# Patient Record
Sex: Female | Born: 1935 | Race: White | Hispanic: No | Smoking: Never smoker
Health system: Southern US, Community
[De-identification: ages and names within clinical notes are randomized; demographics above are authoritative.]

## PROBLEM LIST (undated history)

## (undated) ENCOUNTER — Emergency Department (HOSPITAL_COMMUNITY): Admission: EM | Payer: Medicare Other | Source: Home / Self Care

## (undated) DIAGNOSIS — I82409 Acute embolism and thrombosis of unspecified deep veins of unspecified lower extremity: Secondary | ICD-10-CM

## (undated) DIAGNOSIS — R011 Cardiac murmur, unspecified: Secondary | ICD-10-CM

## (undated) DIAGNOSIS — R05 Cough: Secondary | ICD-10-CM

## (undated) DIAGNOSIS — I10 Essential (primary) hypertension: Secondary | ICD-10-CM

## (undated) DIAGNOSIS — M1990 Unspecified osteoarthritis, unspecified site: Secondary | ICD-10-CM

## (undated) DIAGNOSIS — M810 Age-related osteoporosis without current pathological fracture: Secondary | ICD-10-CM

## (undated) DIAGNOSIS — IMO0001 Reserved for inherently not codable concepts without codable children: Secondary | ICD-10-CM

## (undated) DIAGNOSIS — K219 Gastro-esophageal reflux disease without esophagitis: Secondary | ICD-10-CM

## (undated) DIAGNOSIS — F32A Depression, unspecified: Secondary | ICD-10-CM

## (undated) DIAGNOSIS — E785 Hyperlipidemia, unspecified: Secondary | ICD-10-CM

## (undated) DIAGNOSIS — C801 Malignant (primary) neoplasm, unspecified: Secondary | ICD-10-CM

## (undated) DIAGNOSIS — J45909 Unspecified asthma, uncomplicated: Secondary | ICD-10-CM

## (undated) DIAGNOSIS — R42 Dizziness and giddiness: Secondary | ICD-10-CM

## (undated) DIAGNOSIS — F329 Major depressive disorder, single episode, unspecified: Secondary | ICD-10-CM

## (undated) DIAGNOSIS — H9190 Unspecified hearing loss, unspecified ear: Secondary | ICD-10-CM

## (undated) DIAGNOSIS — R251 Tremor, unspecified: Secondary | ICD-10-CM

## (undated) DIAGNOSIS — K759 Inflammatory liver disease, unspecified: Secondary | ICD-10-CM

## (undated) DIAGNOSIS — R059 Cough, unspecified: Secondary | ICD-10-CM

## (undated) DIAGNOSIS — R002 Palpitations: Secondary | ICD-10-CM

## (undated) DIAGNOSIS — R609 Edema, unspecified: Secondary | ICD-10-CM

## (undated) HISTORY — PX: NOSE SURGERY: SHX723

## (undated) HISTORY — PX: MASTECTOMY: SHX3

## (undated) HISTORY — PX: EYE SURGERY: SHX253

## (undated) HISTORY — PX: EXTERNAL EAR SURGERY: SHX627

## (undated) HISTORY — PX: KNEE ARTHROSCOPY: SUR90

## (undated) HISTORY — PX: CHOLECYSTECTOMY: SHX55

## (undated) HISTORY — PX: BREAST SURGERY: SHX581

## (undated) HISTORY — PX: HERNIA REPAIR: SHX51

## (undated) HISTORY — PX: TONSILLECTOMY: SUR1361

## (undated) HISTORY — PX: TUBAL LIGATION: SHX77

## (undated) HISTORY — PX: IVC FILTER PLACEMENT (ARMC HX): HXRAD1551

## (undated) HISTORY — PX: APPENDECTOMY: SHX54

---

## 2002-09-05 ENCOUNTER — Inpatient Hospital Stay (HOSPITAL_COMMUNITY): Admission: AD | Admit: 2002-09-05 | Discharge: 2002-09-06 | Payer: Self-pay | Admitting: Internal Medicine

## 2002-09-05 ENCOUNTER — Encounter: Payer: Self-pay | Admitting: Internal Medicine

## 2004-09-17 ENCOUNTER — Ambulatory Visit: Payer: Self-pay | Admitting: Family Medicine

## 2005-07-19 ENCOUNTER — Inpatient Hospital Stay (HOSPITAL_COMMUNITY): Admission: RE | Admit: 2005-07-19 | Discharge: 2005-07-23 | Payer: Self-pay | Admitting: Orthopedic Surgery

## 2005-07-19 ENCOUNTER — Ambulatory Visit: Payer: Self-pay | Admitting: Physical Medicine & Rehabilitation

## 2005-07-23 ENCOUNTER — Inpatient Hospital Stay
Admission: RE | Admit: 2005-07-23 | Discharge: 2005-07-29 | Payer: Self-pay | Admitting: Physical Medicine & Rehabilitation

## 2006-02-03 ENCOUNTER — Encounter: Admission: RE | Admit: 2006-02-03 | Discharge: 2006-02-03 | Payer: Self-pay | Admitting: Family Medicine

## 2006-02-03 ENCOUNTER — Encounter (INDEPENDENT_AMBULATORY_CARE_PROVIDER_SITE_OTHER): Payer: Self-pay | Admitting: Radiology

## 2006-02-03 ENCOUNTER — Encounter (INDEPENDENT_AMBULATORY_CARE_PROVIDER_SITE_OTHER): Payer: Self-pay | Admitting: Specialist

## 2006-03-02 ENCOUNTER — Ambulatory Visit (HOSPITAL_COMMUNITY): Admission: RE | Admit: 2006-03-02 | Discharge: 2006-03-02 | Payer: Self-pay | Admitting: Surgery

## 2006-03-02 ENCOUNTER — Encounter: Admission: RE | Admit: 2006-03-02 | Discharge: 2006-03-02 | Payer: Self-pay | Admitting: Surgery

## 2006-03-02 ENCOUNTER — Encounter (INDEPENDENT_AMBULATORY_CARE_PROVIDER_SITE_OTHER): Payer: Self-pay | Admitting: Specialist

## 2006-03-10 ENCOUNTER — Ambulatory Visit: Payer: Self-pay | Admitting: Oncology

## 2006-03-21 ENCOUNTER — Ambulatory Visit: Payer: Self-pay | Admitting: Radiation Oncology

## 2006-04-14 ENCOUNTER — Ambulatory Visit: Payer: Self-pay | Admitting: Radiation Oncology

## 2006-04-26 ENCOUNTER — Ambulatory Visit: Payer: Self-pay | Admitting: Oncology

## 2006-04-28 LAB — CBC WITH DIFFERENTIAL (CANCER CENTER ONLY)
BASO#: 0 10*3/uL (ref 0.0–0.2)
BASO%: 0.6 % (ref 0.0–2.0)
EOS%: 4.6 % (ref 0.0–7.0)
HGB: 13.4 g/dL (ref 11.6–15.9)
MCH: 28.8 pg (ref 26.0–34.0)
MCHC: 33.6 g/dL (ref 32.0–36.0)
MONO%: 11 % (ref 0.0–13.0)
NEUT#: 3.5 10*3/uL (ref 1.5–6.5)
NEUT%: 58.7 % (ref 39.6–80.0)
RDW: 12.7 % (ref 10.5–14.6)

## 2006-05-14 ENCOUNTER — Ambulatory Visit: Payer: Self-pay | Admitting: Radiation Oncology

## 2006-05-30 LAB — CBC WITH DIFFERENTIAL (CANCER CENTER ONLY)
BASO#: 0 10*3/uL (ref 0.0–0.2)
BASO%: 0.5 % (ref 0.0–2.0)
EOS%: 4.6 % (ref 0.0–7.0)
HCT: 41.1 % (ref 34.8–46.6)
HGB: 13.7 g/dL (ref 11.6–15.9)
LYMPH#: 1.4 10*3/uL (ref 0.9–3.3)
LYMPH%: 25.7 % (ref 14.0–48.0)
MCH: 29.3 pg (ref 26.0–34.0)
MCHC: 33.3 g/dL (ref 32.0–36.0)
MCV: 88 fL (ref 81–101)
NEUT%: 59.3 % (ref 39.6–80.0)
RDW: 13.9 % (ref 10.5–14.6)

## 2006-05-30 LAB — COMPREHENSIVE METABOLIC PANEL
ALT: 19 U/L (ref 0–35)
AST: 25 U/L (ref 0–37)
BUN: 18 mg/dL (ref 6–23)
Creatinine, Ser: 0.63 mg/dL (ref 0.40–1.20)
Total Bilirubin: 0.5 mg/dL (ref 0.3–1.2)

## 2006-06-14 ENCOUNTER — Ambulatory Visit: Payer: Self-pay | Admitting: Radiation Oncology

## 2006-06-29 ENCOUNTER — Ambulatory Visit: Payer: Self-pay | Admitting: Oncology

## 2006-07-04 LAB — COMPREHENSIVE METABOLIC PANEL
ALT: 20 U/L (ref 0–35)
Albumin: 4 g/dL (ref 3.5–5.2)
BUN: 23 mg/dL (ref 6–23)
CO2: 26 mEq/L (ref 19–32)
Calcium: 9.5 mg/dL (ref 8.4–10.5)
Chloride: 102 mEq/L (ref 96–112)
Creatinine, Ser: 0.77 mg/dL (ref 0.40–1.20)
Potassium: 4.3 mEq/L (ref 3.5–5.3)

## 2006-07-04 LAB — CBC WITH DIFFERENTIAL (CANCER CENTER ONLY)
Eosinophils Absolute: 0.3 10*3/uL (ref 0.0–0.5)
LYMPH#: 1.4 10*3/uL (ref 0.9–3.3)
MONO#: 0.8 10*3/uL (ref 0.1–0.9)
MONO%: 10.8 % (ref 0.0–13.0)
NEUT#: 4.9 10*3/uL (ref 1.5–6.5)
Platelets: 273 10*3/uL (ref 145–400)
RBC: 4.94 10*6/uL (ref 3.70–5.32)
WBC: 7.5 10*3/uL (ref 3.9–10.0)

## 2006-07-04 LAB — CANCER ANTIGEN 27.29: CA 27.29: 24 U/mL (ref 0–39)

## 2006-07-08 ENCOUNTER — Encounter: Admission: RE | Admit: 2006-07-08 | Discharge: 2006-07-08 | Payer: Self-pay | Admitting: Oncology

## 2006-08-02 LAB — CBC WITH DIFFERENTIAL (CANCER CENTER ONLY)
BASO#: 0 10e3/uL (ref 0.0–0.2)
BASO%: 0.5 % (ref 0.0–2.0)
EOS%: 4.7 % (ref 0.0–7.0)
Eosinophils Absolute: 0.3 10e3/uL (ref 0.0–0.5)
HCT: 39 % (ref 34.8–46.6)
HGB: 13.3 g/dL (ref 11.6–15.9)
LYMPH#: 1.5 10e3/uL (ref 0.9–3.3)
LYMPH%: 24.1 % (ref 14.0–48.0)
MCH: 29.9 pg (ref 26.0–34.0)
MCHC: 34.1 g/dL (ref 32.0–36.0)
MCV: 88 fL (ref 81–101)
MONO#: 0.7 10e3/uL (ref 0.1–0.9)
MONO%: 10.4 % (ref 0.0–13.0)
NEUT#: 3.9 10e3/uL (ref 1.5–6.5)
NEUT%: 60.3 % (ref 39.6–80.0)
Platelets: 261 10e3/uL (ref 145–400)
RBC: 4.45 10e6/uL (ref 3.70–5.32)
RDW: 12 % (ref 10.5–14.6)
WBC: 6.4 10e3/uL (ref 3.9–10.0)

## 2006-08-02 LAB — COMPREHENSIVE METABOLIC PANEL
ALT: 35 U/L (ref 0–35)
Alkaline Phosphatase: 62 U/L (ref 39–117)
Glucose, Bld: 109 mg/dL — ABNORMAL HIGH (ref 70–99)
Sodium: 139 mEq/L (ref 135–145)
Total Bilirubin: 0.4 mg/dL (ref 0.3–1.2)
Total Protein: 6.2 g/dL (ref 6.0–8.3)

## 2006-08-12 ENCOUNTER — Ambulatory Visit: Payer: Self-pay | Admitting: Oncology

## 2006-08-16 LAB — PROTIME-INR (CHCC SATELLITE): INR: 1.2 — ABNORMAL LOW (ref 2.0–3.5)

## 2006-08-18 LAB — PROTIME-INR (CHCC SATELLITE): INR: 1.4 — ABNORMAL LOW (ref 2.0–3.5)

## 2006-08-19 LAB — PROTIME-INR (CHCC SATELLITE)

## 2006-08-30 LAB — PROTIME-INR (CHCC SATELLITE): Protime: 38.4 Seconds — ABNORMAL HIGH (ref 10.6–13.4)

## 2006-09-06 LAB — PROTIME-INR (CHCC SATELLITE)
INR: 2.1 (ref 2.0–3.5)
Protime: 25.2 Seconds — ABNORMAL HIGH (ref 10.6–13.4)

## 2006-09-06 LAB — CBC WITH DIFFERENTIAL (CANCER CENTER ONLY)
BASO%: 0.5 % (ref 0.0–2.0)
HCT: 39.7 % (ref 34.8–46.6)
LYMPH%: 24.5 % (ref 14.0–48.0)
MCHC: 33.3 g/dL (ref 32.0–36.0)
MCV: 88 fL (ref 81–101)
MONO#: 0.7 10*3/uL (ref 0.1–0.9)
NEUT%: 61.1 % (ref 39.6–80.0)
RDW: 12.4 % (ref 10.5–14.6)
WBC: 7.8 10*3/uL (ref 3.9–10.0)

## 2006-09-20 LAB — PROTIME-INR (CHCC SATELLITE): INR: 3.3 (ref 2.0–3.5)

## 2006-09-26 ENCOUNTER — Ambulatory Visit: Payer: Self-pay | Admitting: Oncology

## 2006-09-27 LAB — PROTIME-INR (CHCC SATELLITE)

## 2006-10-12 LAB — PROTIME-INR (CHCC SATELLITE)
INR: 2.3 (ref 2.0–3.5)
Protime: 27.6 Seconds — ABNORMAL HIGH (ref 10.6–13.4)

## 2006-10-14 ENCOUNTER — Encounter: Admission: RE | Admit: 2006-10-14 | Discharge: 2006-10-14 | Payer: Self-pay | Admitting: Oncology

## 2006-11-02 LAB — COMPREHENSIVE METABOLIC PANEL
ALT: 22 U/L (ref 0–35)
AST: 28 U/L (ref 0–37)
Alkaline Phosphatase: 69 U/L (ref 39–117)
CO2: 29 mEq/L (ref 19–32)
Sodium: 140 mEq/L (ref 135–145)
Total Bilirubin: 0.5 mg/dL (ref 0.3–1.2)
Total Protein: 6.1 g/dL (ref 6.0–8.3)

## 2006-11-02 LAB — CBC WITH DIFFERENTIAL (CANCER CENTER ONLY)
BASO%: 0.6 % (ref 0.0–2.0)
EOS%: 6 % (ref 0.0–7.0)
HCT: 37.5 % (ref 34.8–46.6)
LYMPH#: 1.9 10*3/uL (ref 0.9–3.3)
LYMPH%: 26.2 % (ref 14.0–48.0)
MCH: 29.3 pg (ref 26.0–34.0)
MCHC: 34.4 g/dL (ref 32.0–36.0)
MONO%: 8.6 % (ref 0.0–13.0)
NEUT%: 58.6 % (ref 39.6–80.0)
RDW: 12.1 % (ref 10.5–14.6)

## 2006-11-02 LAB — PROTIME-INR (CHCC SATELLITE): Protime: 24 Seconds — ABNORMAL HIGH (ref 10.6–13.4)

## 2006-11-10 ENCOUNTER — Ambulatory Visit: Payer: Self-pay | Admitting: Oncology

## 2006-11-11 LAB — PROTIME-INR (CHCC SATELLITE)
INR: 1.4 — ABNORMAL LOW (ref 2.0–3.5)
Protime: 16.8 Seconds — ABNORMAL HIGH (ref 10.6–13.4)

## 2006-11-18 LAB — PROTIME-INR (CHCC SATELLITE): INR: 1.2 — ABNORMAL LOW (ref 2.0–3.5)

## 2006-11-24 ENCOUNTER — Ambulatory Visit: Payer: Self-pay | Admitting: Radiation Oncology

## 2006-11-25 LAB — PROTIME-INR (CHCC SATELLITE)

## 2006-12-02 LAB — PROTHROMBIN TIME
INR: 2.3 — ABNORMAL HIGH (ref 0.0–1.5)
Prothrombin Time: 26.5 seconds — ABNORMAL HIGH (ref 11.6–15.2)

## 2006-12-13 ENCOUNTER — Ambulatory Visit: Payer: Self-pay | Admitting: Radiation Oncology

## 2006-12-14 LAB — CBC WITH DIFFERENTIAL (CANCER CENTER ONLY)
BASO#: 0 10*3/uL (ref 0.0–0.2)
BASO%: 0.6 % (ref 0.0–2.0)
EOS%: 8.5 % — ABNORMAL HIGH (ref 0.0–7.0)
Eosinophils Absolute: 0.6 10*3/uL — ABNORMAL HIGH (ref 0.0–0.5)
HCT: 39.6 % (ref 34.8–46.6)
HGB: 13.2 g/dL (ref 11.6–15.9)
LYMPH#: 1.6 10*3/uL (ref 0.9–3.3)
LYMPH%: 22.5 % (ref 14.0–48.0)
MCH: 28.4 pg (ref 26.0–34.0)
MCHC: 33.5 g/dL (ref 32.0–36.0)
MCV: 85 fL (ref 81–101)
MONO#: 0.7 10*3/uL (ref 0.1–0.9)
MONO%: 9.2 % (ref 0.0–13.0)
NEUT#: 4.2 10*3/uL (ref 1.5–6.5)
NEUT%: 59.2 % (ref 39.6–80.0)
Platelets: 296 10*3/uL (ref 145–400)
RBC: 4.66 10*6/uL (ref 3.70–5.32)
RDW: 12.7 % (ref 10.5–14.6)
WBC: 7 10*3/uL (ref 3.9–10.0)

## 2006-12-14 LAB — PROTIME-INR (CHCC SATELLITE)
INR: 2.4 (ref 2.0–3.5)
Protime: 28.8 Seconds — ABNORMAL HIGH (ref 10.6–13.4)

## 2006-12-14 LAB — COMPREHENSIVE METABOLIC PANEL
AST: 21 U/L (ref 0–37)
Alkaline Phosphatase: 72 U/L (ref 39–117)
BUN: 19 mg/dL (ref 6–23)
Creatinine, Ser: 0.66 mg/dL (ref 0.40–1.20)
Potassium: 4.3 mEq/L (ref 3.5–5.3)
Total Bilirubin: 0.4 mg/dL (ref 0.3–1.2)

## 2006-12-19 LAB — PROTIME-INR (CHCC SATELLITE)
INR: 2.4 (ref 2.0–3.5)
Protime: 28.8 Seconds — ABNORMAL HIGH (ref 10.6–13.4)

## 2007-01-13 ENCOUNTER — Ambulatory Visit: Payer: Self-pay | Admitting: Oncology

## 2007-01-17 LAB — PROTIME-INR (CHCC SATELLITE)
INR: 3.5 (ref 2.0–3.5)
Protime: 42 Seconds — ABNORMAL HIGH (ref 10.6–13.4)

## 2007-02-06 ENCOUNTER — Encounter: Admission: RE | Admit: 2007-02-06 | Discharge: 2007-02-06 | Payer: Self-pay | Admitting: Oncology

## 2007-02-14 LAB — PROTIME-INR (CHCC SATELLITE): INR: 1.9 — ABNORMAL LOW (ref 2.0–3.5)

## 2007-05-15 ENCOUNTER — Ambulatory Visit: Payer: Self-pay | Admitting: Radiation Oncology

## 2007-05-26 ENCOUNTER — Ambulatory Visit: Payer: Self-pay | Admitting: Radiation Oncology

## 2007-06-14 ENCOUNTER — Ambulatory Visit: Payer: Self-pay | Admitting: Oncology

## 2007-06-15 ENCOUNTER — Ambulatory Visit: Payer: Self-pay | Admitting: Radiation Oncology

## 2007-06-20 LAB — CBC WITH DIFFERENTIAL (CANCER CENTER ONLY)
BASO#: 0 10*3/uL (ref 0.0–0.2)
EOS%: 4.3 % (ref 0.0–7.0)
Eosinophils Absolute: 0.3 10*3/uL (ref 0.0–0.5)
HGB: 13.4 g/dL (ref 11.6–15.9)
LYMPH#: 1.7 10*3/uL (ref 0.9–3.3)
NEUT#: 3.5 10*3/uL (ref 1.5–6.5)
Platelets: 274 10*3/uL (ref 145–400)
RBC: 4.63 10*6/uL (ref 3.70–5.32)
WBC: 6.1 10*3/uL (ref 3.9–10.0)

## 2007-06-20 LAB — COMPREHENSIVE METABOLIC PANEL
ALT: 14 U/L (ref 0–35)
AST: 24 U/L (ref 0–37)
Albumin: 3.8 g/dL (ref 3.5–5.2)
BUN: 23 mg/dL (ref 6–23)
Calcium: 9.3 mg/dL (ref 8.4–10.5)
Chloride: 102 mEq/L (ref 96–112)
Potassium: 4.6 mEq/L (ref 3.5–5.3)
Sodium: 139 mEq/L (ref 135–145)
Total Protein: 6.8 g/dL (ref 6.0–8.3)

## 2007-11-13 ENCOUNTER — Ambulatory Visit: Payer: Self-pay | Admitting: Radiation Oncology

## 2007-11-27 ENCOUNTER — Ambulatory Visit: Payer: Self-pay | Admitting: Radiation Oncology

## 2007-11-27 ENCOUNTER — Ambulatory Visit: Payer: Self-pay | Admitting: Oncology

## 2007-11-28 ENCOUNTER — Ambulatory Visit: Payer: Self-pay | Admitting: Oncology

## 2007-11-28 LAB — CBC WITH DIFFERENTIAL (CANCER CENTER ONLY)
BASO#: 0.1 10*3/uL (ref 0.0–0.2)
BASO%: 0.7 % (ref 0.0–2.0)
Eosinophils Absolute: 0.4 10*3/uL (ref 0.0–0.5)
HCT: 39.7 % (ref 34.8–46.6)
HGB: 13.7 g/dL (ref 11.6–15.9)
LYMPH#: 2.1 10*3/uL (ref 0.9–3.3)
LYMPH%: 19.3 % (ref 14.0–48.0)
MCV: 84 fL (ref 81–101)
MONO#: 0.9 10*3/uL (ref 0.1–0.9)
NEUT%: 68.1 % (ref 39.6–80.0)
RDW: 12.5 % (ref 10.5–14.6)
WBC: 10.8 10*3/uL — ABNORMAL HIGH (ref 3.9–10.0)

## 2007-11-28 LAB — CMP (CANCER CENTER ONLY)
ALT(SGPT): 17 U/L (ref 10–47)
Albumin: 3.6 g/dL (ref 3.3–5.5)
CO2: 29 mEq/L (ref 18–33)
Calcium: 10.2 mg/dL (ref 8.0–10.3)
Chloride: 99 mEq/L (ref 98–108)
Potassium: 4.5 mEq/L (ref 3.3–4.7)
Sodium: 136 mEq/L (ref 128–145)
Total Protein: 7.9 g/dL (ref 6.4–8.1)

## 2007-11-28 LAB — CANCER ANTIGEN 27.29: CA 27.29: 27 U/mL (ref 0–39)

## 2007-12-13 ENCOUNTER — Ambulatory Visit: Payer: Self-pay | Admitting: Radiation Oncology

## 2008-02-07 ENCOUNTER — Encounter: Admission: RE | Admit: 2008-02-07 | Discharge: 2008-02-07 | Payer: Self-pay | Admitting: Oncology

## 2008-02-13 ENCOUNTER — Ambulatory Visit: Payer: Self-pay | Admitting: Radiation Oncology

## 2008-02-28 ENCOUNTER — Ambulatory Visit: Payer: Self-pay | Admitting: Oncology

## 2008-03-04 LAB — COMPREHENSIVE METABOLIC PANEL
ALT: 19 U/L (ref 0–35)
BUN: 15 mg/dL (ref 6–23)
CO2: 29 mEq/L (ref 19–32)
Creatinine, Ser: 0.81 mg/dL (ref 0.40–1.20)
Glucose, Bld: 95 mg/dL (ref 70–99)
Total Bilirubin: 0.7 mg/dL (ref 0.3–1.2)

## 2008-03-04 LAB — CBC WITH DIFFERENTIAL (CANCER CENTER ONLY)
BASO%: 0.6 % (ref 0.0–2.0)
Eosinophils Absolute: 0.3 10*3/uL (ref 0.0–0.5)
HCT: 37.2 % (ref 34.8–46.6)
LYMPH#: 2.3 10*3/uL (ref 0.9–3.3)
LYMPH%: 31.6 % (ref 14.0–48.0)
MCV: 85 fL (ref 81–101)
MONO#: 0.7 10*3/uL (ref 0.1–0.9)
Platelets: 290 10*3/uL (ref 145–400)
RBC: 4.38 10*6/uL (ref 3.70–5.32)
RDW: 12.8 % (ref 10.5–14.6)
WBC: 7.2 10*3/uL (ref 3.9–10.0)

## 2008-03-04 LAB — CANCER ANTIGEN 27.29: CA 27.29: 23 U/mL (ref 0–39)

## 2008-05-14 ENCOUNTER — Ambulatory Visit: Payer: Self-pay | Admitting: Radiation Oncology

## 2008-06-03 ENCOUNTER — Ambulatory Visit: Payer: Self-pay | Admitting: Radiation Oncology

## 2008-06-14 ENCOUNTER — Ambulatory Visit: Payer: Self-pay | Admitting: Radiation Oncology

## 2008-08-29 ENCOUNTER — Ambulatory Visit: Payer: Self-pay | Admitting: Oncology

## 2008-09-02 LAB — CMP (CANCER CENTER ONLY)
ALT(SGPT): 21 U/L (ref 10–47)
AST: 30 U/L (ref 11–38)
Calcium: 9.4 mg/dL (ref 8.0–10.3)
Chloride: 96 mEq/L — ABNORMAL LOW (ref 98–108)
Creat: 0.6 mg/dl (ref 0.6–1.2)

## 2008-09-02 LAB — CBC WITH DIFFERENTIAL (CANCER CENTER ONLY)
BASO%: 0.7 % (ref 0.0–2.0)
Eosinophils Absolute: 0.3 10*3/uL (ref 0.0–0.5)
LYMPH%: 34.1 % (ref 14.0–48.0)
MONO#: 0.5 10*3/uL (ref 0.1–0.9)
MONO%: 7.8 % (ref 0.0–13.0)
NEUT#: 3.6 10*3/uL (ref 1.5–6.5)
Platelets: 264 10*3/uL (ref 145–400)
RBC: 4.74 10*6/uL (ref 3.70–5.32)
RDW: 12.2 % (ref 10.5–14.6)
WBC: 6.8 10*3/uL (ref 3.9–10.0)

## 2008-12-03 ENCOUNTER — Ambulatory Visit: Payer: Self-pay | Admitting: Obstetrics & Gynecology

## 2009-02-12 ENCOUNTER — Encounter: Admission: RE | Admit: 2009-02-12 | Discharge: 2009-02-12 | Payer: Self-pay | Admitting: Surgery

## 2009-03-20 ENCOUNTER — Ambulatory Visit: Payer: Self-pay | Admitting: Oncology

## 2009-03-24 LAB — CBC WITH DIFFERENTIAL (CANCER CENTER ONLY)
EOS%: 4.5 % (ref 0.0–7.0)
LYMPH%: 33.4 % (ref 14.0–48.0)
MCH: 29.3 pg (ref 26.0–34.0)
MCHC: 33.2 g/dL (ref 32.0–36.0)
MCV: 88 fL (ref 81–101)
MONO%: 8.7 % (ref 0.0–13.0)
NEUT#: 3.7 10*3/uL (ref 1.5–6.5)
Platelets: 304 10*3/uL (ref 145–400)
RDW: 13.6 % (ref 10.5–14.6)

## 2009-03-24 LAB — CMP (CANCER CENTER ONLY)
ALT(SGPT): 23 U/L (ref 10–47)
BUN, Bld: 19 mg/dL (ref 7–22)
CO2: 31 mEq/L (ref 18–33)
Calcium: 9.8 mg/dL (ref 8.0–10.3)
Chloride: 98 mEq/L (ref 98–108)
Creat: 0.7 mg/dl (ref 0.6–1.2)

## 2009-03-24 LAB — CANCER ANTIGEN 27.29: CA 27.29: 20 U/mL (ref 0–39)

## 2009-05-14 ENCOUNTER — Ambulatory Visit: Payer: Self-pay | Admitting: Radiation Oncology

## 2009-05-23 ENCOUNTER — Encounter (HOSPITAL_BASED_OUTPATIENT_CLINIC_OR_DEPARTMENT_OTHER): Admission: RE | Admit: 2009-05-23 | Discharge: 2009-06-05 | Payer: Self-pay | Admitting: General Surgery

## 2009-06-02 ENCOUNTER — Ambulatory Visit: Payer: Self-pay | Admitting: Radiation Oncology

## 2009-06-10 ENCOUNTER — Encounter (HOSPITAL_BASED_OUTPATIENT_CLINIC_OR_DEPARTMENT_OTHER): Admission: RE | Admit: 2009-06-10 | Discharge: 2009-07-07 | Payer: Self-pay | Admitting: General Surgery

## 2009-06-14 ENCOUNTER — Ambulatory Visit: Payer: Self-pay | Admitting: Radiation Oncology

## 2009-07-03 ENCOUNTER — Inpatient Hospital Stay: Payer: Self-pay | Admitting: Internal Medicine

## 2009-07-06 ENCOUNTER — Emergency Department: Payer: Self-pay

## 2009-07-31 ENCOUNTER — Ambulatory Visit: Payer: Self-pay | Admitting: General Surgery

## 2009-08-04 ENCOUNTER — Ambulatory Visit: Payer: Self-pay | Admitting: General Surgery

## 2009-08-27 ENCOUNTER — Other Ambulatory Visit: Payer: Self-pay | Admitting: General Surgery

## 2009-09-19 ENCOUNTER — Ambulatory Visit: Payer: Self-pay | Admitting: Oncology

## 2009-09-23 LAB — CBC WITH DIFFERENTIAL (CANCER CENTER ONLY)
BASO#: 0 10*3/uL (ref 0.0–0.2)
Eosinophils Absolute: 0.3 10*3/uL (ref 0.0–0.5)
HGB: 12.7 g/dL (ref 11.6–15.9)
MCH: 28.5 pg (ref 26.0–34.0)
MCV: 83 fL (ref 81–101)
MONO#: 0.6 10*3/uL (ref 0.1–0.9)
NEUT#: 3.8 10*3/uL (ref 1.5–6.5)
Platelets: 319 10*3/uL (ref 145–400)
RBC: 4.47 10*6/uL (ref 3.70–5.32)
WBC: 7 10*3/uL (ref 3.9–10.0)

## 2009-09-23 LAB — CMP (CANCER CENTER ONLY)
Albumin: 3.3 g/dL (ref 3.3–5.5)
BUN, Bld: 23 mg/dL — ABNORMAL HIGH (ref 7–22)
CO2: 31 mEq/L (ref 18–33)
Calcium: 9.2 mg/dL (ref 8.0–10.3)
Glucose, Bld: 105 mg/dL (ref 73–118)
Potassium: 4.4 mEq/L (ref 3.3–4.7)
Sodium: 133 mEq/L (ref 128–145)
Total Protein: 7.1 g/dL (ref 6.4–8.1)

## 2009-09-23 LAB — PROTIME-INR (CHCC SATELLITE)
INR: 1.3 — ABNORMAL LOW (ref 2.0–3.5)
Protime: 15.6 Seconds — ABNORMAL HIGH (ref 10.6–13.4)

## 2009-10-06 LAB — PROTIME-INR (CHCC SATELLITE)

## 2009-10-13 LAB — PROTIME-INR (CHCC SATELLITE): INR: 3.1 (ref 2.0–3.5)

## 2009-10-15 ENCOUNTER — Ambulatory Visit: Payer: Self-pay | Admitting: Oncology

## 2009-10-20 LAB — PROTIME-INR (CHCC SATELLITE)
INR: 1.7 — ABNORMAL LOW (ref 2.0–3.5)
Protime: 20.4 Seconds — ABNORMAL HIGH (ref 10.6–13.4)

## 2009-10-23 LAB — PROTIME-INR (CHCC SATELLITE): Protime: 21.6 Seconds — ABNORMAL HIGH (ref 10.6–13.4)

## 2009-10-23 LAB — CBC WITH DIFFERENTIAL (CANCER CENTER ONLY)
BASO%: 0.8 % (ref 0.0–2.0)
EOS%: 3.8 % (ref 0.0–7.0)
LYMPH#: 2.3 10*3/uL (ref 0.9–3.3)
MCHC: 34.1 g/dL (ref 32.0–36.0)
MONO#: 0.5 10*3/uL (ref 0.1–0.9)
NEUT#: 3.2 10*3/uL (ref 1.5–6.5)
NEUT%: 51 % (ref 39.6–80.0)
Platelets: 310 10*3/uL (ref 145–400)
RDW: 14.2 % (ref 10.5–14.6)
WBC: 6.4 10*3/uL (ref 3.9–10.0)

## 2009-10-30 LAB — PROTIME-INR (CHCC SATELLITE): Protime: 24 Seconds — ABNORMAL HIGH (ref 10.6–13.4)

## 2009-11-06 LAB — PROTIME-INR (CHCC SATELLITE): Protime: 24 Seconds — ABNORMAL HIGH (ref 10.6–13.4)

## 2009-11-13 LAB — PROTIME-INR (CHCC SATELLITE)

## 2009-11-17 ENCOUNTER — Ambulatory Visit: Payer: Self-pay | Admitting: Oncology

## 2009-11-20 LAB — PROTIME-INR (CHCC SATELLITE)
INR: 2 (ref 2.0–3.5)
Protime: 24 Seconds — ABNORMAL HIGH (ref 10.6–13.4)

## 2009-12-10 LAB — CMP (CANCER CENTER ONLY)
ALT(SGPT): 22 U/L (ref 10–47)
AST: 27 U/L (ref 11–38)
Albumin: 3.3 g/dL (ref 3.3–5.5)
Alkaline Phosphatase: 81 U/L (ref 26–84)
Glucose, Bld: 130 mg/dL — ABNORMAL HIGH (ref 73–118)
Potassium: 4.3 mEq/L (ref 3.3–4.7)
Sodium: 138 mEq/L (ref 128–145)
Total Bilirubin: 0.7 mg/dl (ref 0.20–1.60)
Total Protein: 6.5 g/dL (ref 6.4–8.1)

## 2009-12-10 LAB — CBC WITH DIFFERENTIAL (CANCER CENTER ONLY)
BASO%: 0.7 % (ref 0.0–2.0)
Eosinophils Absolute: 0.3 10*3/uL (ref 0.0–0.5)
MCH: 28.7 pg (ref 26.0–34.0)
MONO%: 10.1 % (ref 0.0–13.0)
NEUT#: 3.6 10*3/uL (ref 1.5–6.5)
Platelets: 335 10*3/uL (ref 145–400)
RBC: 4.51 10*6/uL (ref 3.70–5.32)
RDW: 13.7 % (ref 10.5–14.6)
WBC: 6.8 10*3/uL (ref 3.9–10.0)

## 2009-12-10 LAB — PROTIME-INR (CHCC SATELLITE): INR: 2.1 (ref 2.0–3.5)

## 2010-01-02 ENCOUNTER — Ambulatory Visit: Payer: Self-pay | Admitting: Oncology

## 2010-01-30 ENCOUNTER — Ambulatory Visit: Payer: Self-pay | Admitting: Oncology

## 2010-02-04 LAB — PROTIME-INR (CHCC SATELLITE): INR: 2.3 (ref 2.0–3.5)

## 2010-02-19 LAB — CBC WITH DIFFERENTIAL (CANCER CENTER ONLY)
BASO#: 0 10*3/uL (ref 0.0–0.2)
Eosinophils Absolute: 0.3 10*3/uL (ref 0.0–0.5)
HCT: 37.6 % (ref 34.8–46.6)
HGB: 12.9 g/dL (ref 11.6–15.9)
LYMPH%: 33 % (ref 14.0–48.0)
MCH: 29 pg (ref 26.0–34.0)
MCV: 85 fL (ref 81–101)
MONO%: 9.5 % (ref 0.0–13.0)
NEUT%: 52.8 % (ref 39.6–80.0)
RBC: 4.44 10*6/uL (ref 3.70–5.32)

## 2010-02-26 ENCOUNTER — Ambulatory Visit: Payer: Self-pay | Admitting: Oncology

## 2010-03-04 LAB — PROTIME-INR (CHCC SATELLITE)
INR: 2.1 (ref 2.0–3.5)
Protime: 25.2 Seconds — ABNORMAL HIGH (ref 10.6–13.4)

## 2010-03-25 ENCOUNTER — Ambulatory Visit: Payer: Self-pay | Admitting: Oncology

## 2010-04-16 ENCOUNTER — Ambulatory Visit: Payer: Self-pay | Admitting: Oncology

## 2010-04-22 ENCOUNTER — Ambulatory Visit: Payer: Self-pay | Admitting: Oncology

## 2010-04-27 ENCOUNTER — Ambulatory Visit: Payer: Self-pay | Admitting: Oncology

## 2010-04-29 LAB — PROTIME-INR (CHCC SATELLITE)
INR: 2.5 (ref 2.0–3.5)
Protime: 30 Seconds — ABNORMAL HIGH (ref 10.6–13.4)

## 2010-05-14 ENCOUNTER — Ambulatory Visit: Payer: Self-pay | Admitting: Oncology

## 2010-05-21 ENCOUNTER — Ambulatory Visit: Payer: Self-pay | Admitting: Oncology

## 2010-07-06 ENCOUNTER — Encounter: Payer: Self-pay | Admitting: Oncology

## 2010-10-26 ENCOUNTER — Ambulatory Visit: Payer: Self-pay | Admitting: Oncology

## 2010-10-27 LAB — CANCER ANTIGEN 27.29: CA 27.29: 25 U/mL (ref 0.0–38.6)

## 2010-10-27 NOTE — Assessment & Plan Note (Signed)
[name], [name] NO.:  [id number]   MEDICAL RECORD NO.:  [id number]          PATIENT TYPE:  POB   LOCATION:  [facility] at [facility]         FACILITY:  [facility]   PHYSICIAN:  Elsie Lincoln, MD      DATE OF BIRTH:  1924-07-01   DATE OF SERVICE:  12/03/2008                                  CLINIC NOTE   The patient is an 77 year old G5, para 4-0-1-3, who presents with  postmenopausal bleeding.  The patient was diagnosed with breast cancer  in 2007.  She had a lumpectomy and sentinel node biopsy, refused full  axillary node dissection.  She was diagnosed with stage II left breast  carcinoma.  She underwent radiation therapy, but did not do  chemotherapy.  She was started on tamoxifen, but developed a greater  saphenous vein thrombosis in the left leg and tamoxifen was  discontinued.  She has been on Arimidex and is doing fine.  She has had  3 episodes of postmenopausal bleeding this month and she was sent to be  for endometrial biopsy.   PAST MEDICAL HISTORY:  Arthritis, asthma, pneumonia, high cholesterol,  infectious hepatitis, breast cancer, based cell carcinoma, and blood  clot in the greater saphenous vein.   SURGERY:  Tonsillectomy, ruptured appendix, cyst on the right ovary,  bilateral tubal ligation, questionable right cystectomy, questionable  right oophorectomy, questionable right salpingectomy, tumor on the back  removed, umbilical hernia repair, cholecystectomy, basal cell carcinoma  removed on the nose, the knee placement, and the cancer surgery on her  breast.   GYNECOLOGIC HISTORY:  Menarche at age 34, menopause at age 75.  No  postmenopausal bleeding other than this month.  She has never had any  fibroid tumors of her uterus.  She had an ovarian cyst diagnosed at time  of the appendix rupture when she was 20.  She thinks they took out her  right tube, but she is unsure, they may have also taken out a cyst or  her right ovary, but she is unsure.   She is not sexually active.  Her  husband died about 10 years ago.  She has never had abnormal Pap smear  or sexually transmitted diseases.   MEDICATIONS:  Boniva, calcium, multivitamin, aspirin, propranolol, and  Arimidex   ALLERGIES:  PENICILLIN, IBANDRONATE SODIUM, SULFA allergy.  No latex  allergy.   REVIEW OF SYSTEMS:  Positive for breathing and shortness of breath.   SOCIAL HISTORY:  Drinks occasional alcoholic drink.  Denies drugs abuse,  tobacco.   PHYSICAL EXAMINATION:  VITAL SIGNS:  Pulse 64, blood pressure 120/69,  weight 175, height 5 feet 2 inches.  GENERAL:  A well nourished, well developed, no apparent stress.  HEENT:  Normocephalic, atraumatic.  CHEST:  Normal movement.  ABDOMEN:  Soft, nontender.  No organomegaly.  No hernia.  PELVIC:  Genitalia, Tanner V.  Vagina atrophic.  Varicosities of the  labia majora, mild cystocele, nontender.  Bladder and urethra, moderate  uterine prolapse, small anteverted uterus.  No adnexal masses felt.  No  major rectocele.  EXTREMITIES:  Nontender.   ASSESSMENT AND PLAN:  An 77 year old female with postmenopausal  bleeding, not on tamoxifen.  1. Endometrial biopsies.  Please see separated procedure note.  2. Transvaginal ultrasound.  3. Return to clinic in 2 weeks for results.   PROCEDURE NOTE:  After informed consent was obtained, the patient was  placed in dorsal lithotomy position.  The speculum was placed into the  vagina and the cervix was brought into view.  The cervix was cleaned  with Betadine and the anterior lip of the cervix was grasped with a  single-tooth tenaculum.  Uterus sounded to 7 cm, a pass with endometrial  curette performed and some dark blood was obtained and this was sent to  Pathology.  There was good hemostasis at the end of the procedure.  The  patient tolerated the procedure well and all counts were correct.           ______________________________  Elsie Lincoln, MD     KL/MEDQ  D:   12/03/2008  T:  12/04/2008  Job:  [id number]

## 2010-10-30 NOTE — H&P (Signed)
[name], LOPATA NO.:  [id number]   MEDICAL RECORD NO.:  [id number]          PATIENT TYPE:  ORB   LOCATION:  4526                         FACILITY:  [facility]   PHYSICIAN:  Erick Colace, M.D.DATE OF BIRTH:  06/02/1925   DATE OF ADMISSION:  07/23/2005  DATE OF DISCHARGE:                                HISTORY & PHYSICAL   ATTENDING PHYSICIAN:  Ellwood Dense, M.D.   REASON FOR ADMISSION:  Rehabilitation following a right total knee  arthroplasty.   HISTORY:  this 77 year old female was admitted to [facility] on  July 19, 2005, with advanced right knee pain and x-ray findings  consistent with severe osteoarthritis.  She had no relief with conservative  care and underwent a right total knee arthroplasty on July 19, 2005.  She  was placed on Coumadin for deep venous thrombosis prophylaxis and made  weightbearing as tolerated.  Postoperatively her hemoglobin dropped to 9.2.  She had mild confusion and narcotics were decreased.  Her mental status  improved.   REVIEW OF SYSTEMS:  Positive for joint swelling, positive nausea, negative  chest pain, negative vomiting.   PAST MEDICAL HISTORY:  1.  Significant for asthma.  Does not take any medications for this.  2.  Skin cancer.  3.  Osteoporosis.  4.  Pneumonia.  5.  Viral gastroenteritis in the past.   PAST SURGICAL HISTORY:  1.  Hernia repair in 1998.  2.  Pelvic tumor excision in 1990'2.  3.  Tubal ligation in 1961.  4.  Cholecystectomy in 1996.  5.  T&A.   FAMILY HISTORY:  Positive for coronary artery disease, CVA.   SOCIAL HISTORY:  Widowed.  Retired.  Lives alone in [place] in a one-level home  with zero steps to enter.  Prior functional history independent.   FUNCTIONAL HISTORY:  Needs assist with ADL's and mobility.   MEDICATIONS:  1.  Aspirin 81 mg p.o. daily.  2.  Multivitamins, iron one p.o. daily.   ALLERGIES:  PENICILLIN, MORPHINE SULFATE AND COSMETICS.   PHYSICAL  EXAMINATION:  VITAL SIGNS:  Blood pressure 125/59, pulse 71,  respirations 20, temperature 97.5 degrees.  GENERAL:  An elderly female, in no acute distress.  Mood and affect  appropriate.  HEENT:  Eyes anicteric, not injected.  External ENT normal.  NECK:  Supple without adenopathy.  LUNGS:  Respiratory effort is good.  Lungs clear.  HEART:  A regular rate and rhythm.  No murmurs or extra sounds.  ABDOMEN:  Positive bowel sounds, soft, nontender to palpation.  EXTREMITIES:  No clubbing, cyanosis or edema.  Right knee has staples  intact.  No erythema or effusion noted.  He was able to do a straight leg  raise on  the right side as well as the left lower extremity.  NEUROLOGIC:  Sensation normal.  Mood and memory normal, as well as affect.  Motor strength 5/5 in the bilateral deltoid, biceps and triceps, finger  flexors, left hip flexor, quadriceps, TA and gastroc.  On the right side she  has 4/5 hip flexor, quadriceps, TA and gastroc.   IMPRESSION:  1.  Right total knee arthroplasty, secondary to degenerative joint disease,      postoperative day number four, status post total knee replacement.  2.  Postoperative anemia:  Will follow the CBC.  3.  Postoperative confusion, improved with the reduction of narcotics:  Will      watch her on Vicodin.  4.  Osteoporosis:  Continue calcium, but restart Fosamax as an outpatient.  5.  Deep venous thrombosis prophylaxis:  Coumadin per the pharmacy protocol.   Estimated length of stay is five to seven days.  The patient is a good rehab  candidate for the [facility] level.      Erick Colace, M.D.  Electronically Signed     AEK/MEDQ  D:  07/23/2005  T:  07/23/2005  Job:  [id number]   cc:   Talmadge Coventry, M.D.  Fax: [phone]   Ollen Gross, M.D.  Fax: [phone]

## 2010-10-30 NOTE — Discharge Summary (Signed)
NAMERAKHI, ROMAGNOLI NO.:  [id number]   MEDICAL RECORD NO.:  [id number]          PATIENT TYPE:  INP   LOCATION:  1512                         FACILITY:  [facility]   PHYSICIAN:  Ollen Gross, M.D.    DATE OF BIRTH:  07-Jan-1925   DATE OF ADMISSION:  07/19/2005  DATE OF DISCHARGE:  07/23/2005                                 DISCHARGE SUMMARY   ADMITTING DIAGNOSES:  1.  Osteoarthritis, right knee.  2.  Asthma.  3.  History of skin cancer.  4.  Osteoporosis.   DISCHARGE DIAGNOSES:  1.  Osteoarthritis, right knee, status post right total knee arthroplasty.  2.  Postoperative blood loss anemia, did not require transfusion.  3.  Postoperative hyponatremia, improved.  4.  Postoperative confusion, improved.  5.  Postoperative dizziness, improved.  6.  Asthma.  7.  History of skin cancer.  8.  Osteoporosis.   PROCEDURE:  July 19, 2005, right total knee surgery, surgeon Dr. Lequita Halt,  assistant Avel Peace, PA-C.  __________   CONSULTS:  1.  [facility], Dr. Trula Ore Rama.  2.  Rehab services.   BRIEF HISTORY:  Ms. Wee is a 77 year old female with end-stage  osteoarthritis of the right knee.  She has failed nonoperative management,  including injections and arthroscopic debridement, now presents now for  total knee arthroplasty.   LABORATORY DATA:  Admission hemoglobin 13.5, hematocrit 40.2, white cell  count normal 8.4.  Postop hemoglobin 10.2 drifted down to 9.5 last noted 9.2  and 26.3.  Preop PT/PTT 13.3 and 29, respectively.  INR 1.0.  Serial pro  times followed.  Last noted PT/INR 21.6 and 1.9.  Chem panel on admission  all within normal limits.  Serial BMETs were followed.  Sodium dropped from  140 to 132 back up to 136.  Remaining electrolytes remained within normal  limits.  Glucose went up from 120 to 158 back down to 126.  TSH level taken  1 time and normal at 1.075.  Anemia study taken, iron low at 10, TIBC low at  234, percent  saturation 4, ferritin high at 341, UIBC at 224.  Preop UA  trace hemoglobin, trace leukocyte esterase, 0-2 white cells, 0-2 red cells,  and rare epithelium.  Blood group type A positive.   HOSPITAL COURSE:  The patient admitted to [facility], tolerated  procedure well, later transferred to the recovery room and the orthopedic  floor.  Actually was doing pretty well on day 1, got some sleep but did have  some nausea, given antiemetics.  Hemovac drain was pulled on day 1.  The  patient wanted to look into inpatient rehab.  Therefore, rehab consult was  called.  The patient was seen by rehab services and felt that patient would  be able to participate and proceed with a rehab stay.  By day 2, the patient  was a little bit sedated, probably from the PCA and had some dizziness.  Hemoglobin was at 9.5.  The pain pump was discontinued.  I felt it could be  narcotics which were discontinued, gave fluid bolus, had decent urinary  output.  By day 3, the patient continued to have some lightheadedness with  transfers and was found to be orthostatic, given fluid challenge.  Orthostatics pressures did improve on the evening of day 2; however, this  continued into day 3.  A medical consult was called.  The patient was seen  by Dr. Darnelle Catalan and felt likely with still some residual narcotics and  anticholinergics, continued to increase p.o. intake and monitor the  hemodynamic status.  TSH level was checked which was normal, responded well  to p.o. intake and by the following day, day 4, was doing much better.  Still felt a little bit weak; Xanax helped but was doing much better with  the pressure stable, and the dizziness had improved, slowly progressed on  physical therapy by this time.  By day 3 and day 4, the patient was  ambulating approximately 40 feet, felt to be an excellent candidate.  It was  noted that a bed did open up on SACU later that day.  The patient was stable  from medical  standpoint.  The intermittent confusion and the dizziness,  which was felt to be due to narcotic, had resolved, and she was transferred  at that time.   DISCHARGE PLAN:  1.  The patient transferred over to [facility].  2.  Discharge diagnoses, please see above.  3.  Discharge medications.  Current medications as per the [facility] will be sent      over with the patient.   DIET:  Continue current diet.   ACTIVITY:  Weightbearing as tolerated, PT/OT for gait training ambulation,  ADLs for total knee protocol, daily dressing changes, may start showering.   FOLLOW UP:  Two weeks from discharge from the rehab unit.   CONDITION ON DISCHARGE:  Improving.   DISPOSITION:  [facility].      Alexzandrew L. Julien Girt, P.A.      Ollen Gross, M.D.  Electronically Signed    ALP/MEDQ  D:  08/30/2005  T:  08/31/2005  Job:  [id number]   cc:   Rehab Services   Hillery Aldo, M.D.   Talmadge Coventry, M.D.  Fax: [phone]

## 2010-10-30 NOTE — Op Note (Signed)
NAMEDEL, WISEMAN             ACCOUNT NO.:  [id number]   MEDICAL RECORD NO.:  [id number]          PATIENT TYPE:  AMB   LOCATION:  SDS                          FACILITY:  [facility]   PHYSICIAN:  Thornton Park. Daphine Deutscher, MD  DATE OF BIRTH:  20-Mar-1925   DATE OF PROCEDURE:  03/02/2006  DATE OF DISCHARGE:                                 OPERATIVE REPORT   CCS number is [id number].   PREOPERATIVE DIAGNOSIS:  [name] is a 77 year old white female from  the  [facility] on whom I had previous operated who saw me on February 18, 2006 after having a core needle biopsy of a suspicious area in the left  upper outer quadrant of her breast.  This was invasive adenocarcinoma.  Ultrasound showed negative nodes.  She was scheduled for a left breast  partial mastectomy and sentinel lymph node biopsy.  She really declined  having a completion axillary dissection in the event that she had positive  nodes.  Her immunohistochemical study showed that her estrogen receptor was  98% positive, progesterone receptors 45% positive,  proliferation marker Ki-  67 was 9%.  HER-2/neu was negative.   The patient was taken to OR #17 on March 02, 2006 and given general by  LMA.  I did sentinel lymph node mapping first and found very few counts in  the axilla.  I injected the area with methylene blue, and then I made a  small incision and went in and found a lymphatics with dye in them and  traced that in and began getting counts.  I isolated a lymph node cluster  with the only significant counts with a background of 0 and excised this  group in toto.  I put clips around this as I excised the lymphatics and the  vascular structures entering it.  This was then placed on the probe and had  a count of approximately 50.  Again, there was a background count now in the  axillae of 0.  I then irrigated with saline and saw no further bleeding and  then sent the specimen for touch preps which may have some atypical cells  but no definitive cancer.  Again, completion axillary dissection was not  desired by the patient.   In the meantime, I went and excised the previous core biopsy site.  I went  through the upper outer quadrant elliptical incision and then created  superior and medial flaps and went out around what I could feel was the  lesion and went down to the chest wall.  I then excised this in the form of  a quadrantectomy or actual more partial mastectomy to get wide margins and  get the entire radially the upper outer quadrant back almost to the areolar  margin.  This was oriented with sutures in the axilla and medially, and also  a sponge was sewn to the posterior wall and the skin marked the anterior  surface.  This was sent for specimen mammography which showed that the clip  that had been left there was in place.   I then irrigated with saline  and went back and inspected both wounds and  used the electrocautery to control any bleeding that was seen, and really  not much was seen.  I infiltrated the areas with 1% lidocaine and then  closed with 4-0 Vicryl subcutaneously and subcuticularly with Benzoin Steri-  Strips.  A bulky dressing was applied.   The patient will be given Tylox to take for pain.  Instructions were given  to her family, and she will be able to go home today.  For  radiation  therapy, I am going to refer her to [facility] in [place] since she  lives in [place].   FINAL DIAGNOSIS:  Status post left axillary lymph node mapping, left  sentinel lymph node biopsy, left breast partial mastectomy of the upper  outer quadrant.      Thornton Park Daphine Deutscher, MD  Electronically Signed     MBM/MEDQ  D:  03/02/2006  T:  03/03/2006  Job:  [id number]   cc:   Talmadge Coventry, M.D.  [facility]

## 2010-10-30 NOTE — Consult Note (Signed)
[name], [name] NO.:  [id number]   MEDICAL RECORD NO.:  [id number]          PATIENT TYPE:  INP   LOCATION:  1512                         FACILITY:  [facility]   PHYSICIAN:  Hillery Aldo, M.D.   DATE OF BIRTH:  [birth year]   DATE OF CONSULTATION:  07/22/2005  DATE OF DISCHARGE:                                   CONSULTATION   PRIMARY CARE PHYSICIAN:  Talmadge Coventry, M.D.   REASON FOR CONSULTATION:  Dizziness and postoperative confusion.   HISTORY OF PRESENT ILLNESS:  Patient is a 77 year old female who was  admitted by Dr. Despina Hick for right total knee replacement.  The patient felt  some symptomatic orthostatic hypotension on postoperative day #2, which  responded well to fluid bolus.  We are consulted secondary to ongoing  complaints of lightheadedness with position changes.  Notably, the patient  also reports that she had a lot of anxiety prior to her operation and that  normally she does not have this kind of anxiety.  She denies any chest pain.  She has had some mild shortness of breath with exertion after her operation.  She denies any cough.  No subjective arrhythmias.  No focal neurological  deficits.   PAST MEDICAL HISTORY:  1.  Asthma.  2.  Osteoporosis.  3.  Skin cancer.  4.  History of scarlet fever.  5.  History of pneumonia.  6.  History of hospitalization in 2004 for dizziness/orthostatic      hypotension, thought to be secondary to past viral gastroenteritis.   ALLERGIES:  PENICILLIN, MORPHINE, FOSAMAX.   CURRENT MEDICATIONS:  1.  Coumadin per pharmacy.  2.  Docusate sodium 100 mg b.i.d.  3.  Tylenol 650 mg q.4h. p.r.n.  4.  Reglan 10 mg q.8h. p.r.n.  5.  Robaxin 500 mg q.6h. p.r.n.  6.  Phenergan 12.5 mg q.4h. p.r.n.  7.  Vicodin 1-2 tabs p.o. q.4h. p.r.n.   PAST SURGICAL HISTORY:  1.  Tonsillectomy in 1946.  2.  Appendectomy in 1946.  3.  Tubal ligation in 1961.  4.  Fibroid tumor excision in 1990s.  5.  Cholecystectomy in  1996.  6.  Umbilical hernia repair in 1998.  7.  Knee arthroscopy in May, 2006.  8.  Right total knee replacement in February, 2007.   SOCIAL HISTORY:  Patient is widowed and lives independently in [place].  Denies  any tobacco, alcohol or drug use.  She has four offspring, three who are  living.   FAMILY HISTORY:  Patient's mother is deceased at age 13 from heart disease.  The patient's father died at age 9 from complications of stroke.  He also  suffered with asthma.  She has one half brother who has asthma.  She has a  daughter who died from a stroke at age 84.   REVIEW OF SYSTEMS:  No fevers or chills.  Decreased appetite and po fluid  intake.  No chest pain.  Occasional shortness of breath with exertion.  No  cough.  No subjective arrhythmia.  Bowels have not moved since admission.  No blood in the stool.  Occasional nausea without vomiting.  Anxiety as  noted as per HPI.   PHYSICAL EXAMINATION:  VITAL SIGNS:  Temperature 97.9, blood pressure  127/57, pulse 72, respirations 20, O2 saturation 95% on 2 liters of oxygen.  GENERAL:  An anxious female in no acute distress.  HEENT:  Normocephalic and atraumatic.  PERRL.  EOMI.  Visual fields are  full.  Tongue is midline.  Palate rises symmetrically.  NECK:  Supple.  No thyromegaly.  No lymphadenopathy.  No jugular venous  distention.  CHEST:  Lungs are clear to auscultation bilaterally with good air movement.  No wheezes.  HEART:  Regular rate and rhythm.  No murmurs, rubs or gallops.  ABDOMEN:  Soft, nontender, nondistended with normoactive bowel sounds.  EXTREMITIES:  Patient has a right knee incision, which is without signs of  infection.  NEUROLOGIC:  The patient is alert and oriented x3.  Neuro exam is nonfocal.   DATA REVIEW:  Laboratory data reveals a sodium of 136, potassium 4.1,  chloride 105, bicarb 29, BUN 10, creatinine 0.7, glucose 126.  White blood  cell count is 11.7, hemoglobin 9.2, hematocrit 26.3, platelets  238.   ASSESSMENT/PLAN:  1.  Postoperative dizziness:  This is likely multifactorial and related to a      recent episode of dehydration along with ongoing side effects of both      narcotics and anticholinergic medications.  Would continue to push po      fluid intake.  Would continue to monitor hemodynamic status.  Would try      to limit any use of narcotics or anticholinergics.  2.  Anxiety:  Will check the patient's TSH to rule out hyperthyroidism as a      potential explanation for her anxiety.  3.  Postoperative confusion:  The patient is currently alert and oriented.      Her neuro exam is nonfocal, so I would not pursue any further diagnostic      workup. This is likely secondary to narcotics and anticholinergics      postoperatively.  4.  Anemia:  The patient has some acute blood loss anemia secondary to a      recent surgery.  Unclear what her      baseline hemoglobin status is, but it is likely slightly low.  Would      check a ferritin and iron studies to determine if she would benefit from      iron replacement therapy.   Thank you for this consultation.  Will follow the patient with you.           ______________________________  Hillery Aldo, M.D.     CR/MEDQ  D:  07/22/2005  T:  07/22/2005  Job:  [id number]   cc:   Talmadge Coventry, M.D.  Fax: [phone]

## 2010-11-13 ENCOUNTER — Ambulatory Visit: Payer: Self-pay | Admitting: Oncology

## 2011-05-24 ENCOUNTER — Ambulatory Visit: Payer: Self-pay | Admitting: Oncology

## 2011-10-17 ENCOUNTER — Inpatient Hospital Stay: Payer: Self-pay | Admitting: Internal Medicine

## 2011-10-17 LAB — COMPREHENSIVE METABOLIC PANEL
Alkaline Phosphatase: 96 U/L (ref 50–136)
BUN: 17 mg/dL (ref 7–18)
Bilirubin,Total: 0.5 mg/dL (ref 0.2–1.0)
Calcium, Total: 8.7 mg/dL (ref 8.5–10.1)
Chloride: 102 mmol/L (ref 98–107)
Co2: 28 mmol/L (ref 21–32)
Creatinine: 0.68 mg/dL (ref 0.60–1.30)
EGFR (African American): >60
Glucose: 120 mg/dL — ABNORMAL HIGH (ref 65–99)
Potassium: 4.1 mmol/L (ref 3.5–5.1)
Sodium: 138 mmol/L (ref 136–145)

## 2011-10-17 LAB — CBC WITH DIFFERENTIAL/PLATELET
Basophil #: 0.1 10*3/uL (ref 0.0–0.1)
Eosinophil #: 0 10*3/uL (ref 0.0–0.7)
Eosinophil %: 0.2 %
HGB: 12.8 g/dL (ref 12.0–16.0)
Lymphocyte %: 13.5 %
MCHC: 32.6 g/dL (ref 32.0–36.0)
MCV: 87 fL (ref 80–100)
Monocyte %: 11.3 %
Neutrophil #: 12.3 10*3/uL — ABNORMAL HIGH (ref 1.4–6.5)
Neutrophil %: 74.7 %
RBC: 4.5 10*6/uL (ref 3.80–5.20)
RDW: 14 % (ref 11.5–14.5)

## 2011-10-17 LAB — PROTIME-INR
INR: 2
Prothrombin Time: 22.6 secs — ABNORMAL HIGH (ref 11.5–14.7)

## 2011-10-18 LAB — CBC WITH DIFFERENTIAL/PLATELET
Basophil %: 0.2 %
Eosinophil #: 0 10*3/uL (ref 0.0–0.7)
HCT: 38.6 % (ref 35.0–47.0)
HGB: 12.6 g/dL (ref 12.0–16.0)
Lymphocyte #: 1.4 10*3/uL (ref 1.0–3.6)
Lymphocyte %: 11.9 %
MCH: 28.3 pg (ref 26.0–34.0)
MCHC: 32.5 g/dL (ref 32.0–36.0)
MCV: 87 fL (ref 80–100)
Monocyte #: 0.2 x10 3/mm (ref 0.2–0.9)
Monocyte %: 1.6 %
Platelet: 299 10*3/uL (ref 150–440)
RBC: 4.43 10*6/uL (ref 3.80–5.20)

## 2011-10-18 LAB — BASIC METABOLIC PANEL
Calcium, Total: 8.3 mg/dL — ABNORMAL LOW (ref 8.5–10.1)
Chloride: 105 mmol/L (ref 98–107)
Co2: 27 mmol/L (ref 21–32)
Glucose: 196 mg/dL — ABNORMAL HIGH (ref 65–99)
Osmolality: 278 (ref 275–301)
Potassium: 4.1 mmol/L (ref 3.5–5.1)
Sodium: 136 mmol/L (ref 136–145)

## 2011-10-18 LAB — PROTIME-INR: INR: 2.1

## 2011-10-19 LAB — BASIC METABOLIC PANEL
Anion Gap: 12 (ref 7–16)
BUN: 17 mg/dL (ref 7–18)
Chloride: 109 mmol/L — ABNORMAL HIGH (ref 98–107)
Co2: 21 mmol/L (ref 21–32)
Creatinine: 0.56 mg/dL — ABNORMAL LOW (ref 0.60–1.30)
EGFR (Non-African Amer.): >60
Glucose: 122 mg/dL — ABNORMAL HIGH (ref 65–99)
Osmolality: 286 (ref 275–301)
Sodium: 142 mmol/L (ref 136–145)

## 2011-10-19 LAB — CBC WITH DIFFERENTIAL/PLATELET
Eosinophil #: 0 10*3/uL (ref 0.0–0.7)
HCT: 37 % (ref 35.0–47.0)
Lymphocyte %: 8.4 %
Monocyte #: 0.9 x10 3/mm (ref 0.2–0.9)
Neutrophil #: 15.7 10*3/uL — ABNORMAL HIGH (ref 1.4–6.5)
RBC: 4.25 10*6/uL (ref 3.80–5.20)
RDW: 14.7 % — ABNORMAL HIGH (ref 11.5–14.5)
WBC: 18.2 10*3/uL — ABNORMAL HIGH (ref 3.6–11.0)

## 2011-10-20 LAB — CBC WITH DIFFERENTIAL/PLATELET
Basophil #: 0 10*3/uL (ref 0.0–0.1)
Basophil %: 0 %
HCT: 39.5 % (ref 35.0–47.0)
HGB: 12.9 g/dL (ref 12.0–16.0)
Lymphocyte #: 1.7 10*3/uL (ref 1.0–3.6)
Lymphocyte %: 9.8 %
MCH: 28.4 pg (ref 26.0–34.0)
Monocyte #: 1.6 x10 3/mm — ABNORMAL HIGH (ref 0.2–0.9)
Monocyte %: 9.1 %
Neutrophil #: 13.9 10*3/uL — ABNORMAL HIGH (ref 1.4–6.5)
Platelet: 387 10*3/uL (ref 150–440)
RBC: 4.53 10*6/uL (ref 3.80–5.20)
WBC: 17.1 10*3/uL — ABNORMAL HIGH (ref 3.6–11.0)

## 2011-10-20 LAB — PROTIME-INR: INR: 4.3

## 2011-10-20 LAB — BASIC METABOLIC PANEL
BUN: 15 mg/dL (ref 7–18)
Calcium, Total: 8.4 mg/dL — ABNORMAL LOW (ref 8.5–10.1)
Creatinine: 0.58 mg/dL — ABNORMAL LOW (ref 0.60–1.30)
EGFR (Non-African Amer.): >60
Osmolality: 286 (ref 275–301)
Potassium: 4.2 mmol/L (ref 3.5–5.1)

## 2011-10-20 LAB — EXPECTORATED SPUTUM ASSESSMENT W GRAM STAIN, RFLX TO RESP C

## 2011-11-17 ENCOUNTER — Ambulatory Visit: Payer: Self-pay | Admitting: Oncology

## 2011-11-17 LAB — CBC CANCER CENTER
Basophil #: 0.1 x10 3/mm (ref 0.0–0.1)
Eosinophil %: 5.1 %
HCT: 40.9 % (ref 35.0–47.0)
HGB: 13.3 g/dL (ref 12.0–16.0)
Lymphocyte #: 2.9 x10 3/mm (ref 1.0–3.6)
Lymphocyte %: 38.1 %
MCH: 28.2 pg (ref 26.0–34.0)
MCHC: 32.6 g/dL (ref 32.0–36.0)
Monocyte #: 1 x10 3/mm — ABNORMAL HIGH (ref 0.2–0.9)
Monocyte %: 13 %
Neutrophil #: 3.3 x10 3/mm (ref 1.4–6.5)
Neutrophil %: 42.9 %
Platelet: 379 x10 3/mm (ref 150–440)
RBC: 4.73 10*6/uL (ref 3.80–5.20)
RDW: 15.1 % — ABNORMAL HIGH (ref 11.5–14.5)
WBC: 7.6 x10 3/mm (ref 3.6–11.0)

## 2011-11-17 LAB — COMPREHENSIVE METABOLIC PANEL
Albumin: 3.2 g/dL — ABNORMAL LOW (ref 3.4–5.0)
Alkaline Phosphatase: 124 U/L (ref 50–136)
BUN: 16 mg/dL (ref 7–18)
Bilirubin,Total: 0.4 mg/dL (ref 0.2–1.0)
Chloride: 100 mmol/L (ref 98–107)
Co2: 29 mmol/L (ref 21–32)
Glucose: 106 mg/dL — ABNORMAL HIGH (ref 65–99)
SGOT(AST): 23 U/L (ref 15–37)
SGPT (ALT): 21 U/L
Sodium: 138 mmol/L (ref 136–145)

## 2011-11-18 LAB — CANCER ANTIGEN 27.29: CA 27.29: 31.4 U/mL (ref 0.0–38.6)

## 2011-12-13 ENCOUNTER — Ambulatory Visit: Payer: Self-pay | Admitting: Oncology

## 2012-01-17 ENCOUNTER — Ambulatory Visit: Payer: Self-pay | Admitting: Oncology

## 2012-01-19 ENCOUNTER — Ambulatory Visit: Payer: Self-pay | Admitting: Oncology

## 2012-01-19 LAB — CBC CANCER CENTER
Basophil #: 0.1 x10 3/mm (ref 0.0–0.1)
Eosinophil #: 0.2 x10 3/mm (ref 0.0–0.7)
HCT: 40.6 % (ref 35.0–47.0)
HGB: 13.6 g/dL (ref 12.0–16.0)
Lymphocyte #: 3.4 x10 3/mm (ref 1.0–3.6)
MCHC: 33.6 g/dL (ref 32.0–36.0)
MCV: 87 fL (ref 80–100)
Monocyte %: 10 %
Neutrophil #: 4.7 x10 3/mm (ref 1.4–6.5)
RBC: 4.65 10*6/uL (ref 3.80–5.20)
RDW: 15.6 % — ABNORMAL HIGH (ref 11.5–14.5)
WBC: 9.3 x10 3/mm (ref 3.6–11.0)

## 2012-01-19 LAB — COMPREHENSIVE METABOLIC PANEL
Albumin: 3.1 g/dL — ABNORMAL LOW (ref 3.4–5.0)
Alkaline Phosphatase: 87 U/L (ref 50–136)
Calcium, Total: 8.6 mg/dL (ref 8.5–10.1)
Co2: 30 mmol/L (ref 21–32)
EGFR (Non-African Amer.): >60
Glucose: 103 mg/dL — ABNORMAL HIGH (ref 65–99)
Osmolality: 279 (ref 275–301)
SGOT(AST): 20 U/L (ref 15–37)
SGPT (ALT): 20 U/L (ref 12–78)

## 2012-01-20 LAB — CANCER ANTIGEN 27.29: CA 27.29: 29.7 U/mL (ref 0.0–38.6)

## 2012-02-13 ENCOUNTER — Ambulatory Visit: Payer: Self-pay | Admitting: Oncology

## 2012-05-25 ENCOUNTER — Ambulatory Visit: Payer: Self-pay | Admitting: Oncology

## 2012-07-15 ENCOUNTER — Ambulatory Visit: Payer: Self-pay | Admitting: Oncology

## 2012-07-19 LAB — COMPREHENSIVE METABOLIC PANEL
Alkaline Phosphatase: 79 U/L (ref 50–136)
Anion Gap: 5 — ABNORMAL LOW (ref 7–16)
Bilirubin,Total: 0.4 mg/dL (ref 0.2–1.0)
Calcium, Total: 8.8 mg/dL (ref 8.5–10.1)
Chloride: 103 mmol/L (ref 98–107)
EGFR (African American): >60
Glucose: 85 mg/dL (ref 65–99)
Osmolality: 283 (ref 275–301)
Potassium: 4.6 mmol/L (ref 3.5–5.1)
Sodium: 141 mmol/L (ref 136–145)
Total Protein: 7.2 g/dL (ref 6.4–8.2)

## 2012-07-19 LAB — CBC CANCER CENTER
Basophil %: 0.6 %
Eosinophil #: 0.3 x10 3/mm (ref 0.0–0.7)
Eosinophil %: 2.8 %
HCT: 42.3 % (ref 35.0–47.0)
Lymphocyte %: 31.3 %
MCH: 29 pg (ref 26.0–34.0)
MCHC: 33.2 g/dL (ref 32.0–36.0)
Neutrophil #: 4.9 x10 3/mm (ref 1.4–6.5)
Platelet: 297 x10 3/mm (ref 150–440)
RBC: 4.84 10*6/uL (ref 3.80–5.20)
RDW: 14.8 % — ABNORMAL HIGH (ref 11.5–14.5)
WBC: 8.9 x10 3/mm (ref 3.6–11.0)

## 2012-08-12 ENCOUNTER — Ambulatory Visit: Payer: Self-pay | Admitting: Oncology

## 2012-11-22 ENCOUNTER — Ambulatory Visit: Payer: Self-pay | Admitting: Oncology

## 2012-11-22 LAB — CBC CANCER CENTER
Basophil %: 0.9 %
Eosinophil #: 0.3 x10 3/mm (ref 0.0–0.7)
Eosinophil %: 3.9 %
HGB: 13.6 g/dL (ref 12.0–16.0)
Lymphocyte %: 38.8 %
MCHC: 33.4 g/dL (ref 32.0–36.0)
MCV: 86 fL (ref 80–100)
Monocyte #: 0.9 x10 3/mm (ref 0.2–0.9)
Monocyte %: 11.2 %
Neutrophil #: 3.7 x10 3/mm (ref 1.4–6.5)
Neutrophil %: 45.2 %
Platelet: 306 x10 3/mm (ref 150–440)
RBC: 4.74 10*6/uL (ref 3.80–5.20)
RDW: 15 % — ABNORMAL HIGH (ref 11.5–14.5)

## 2012-11-22 LAB — COMPREHENSIVE METABOLIC PANEL
Albumin: 3.2 g/dL — ABNORMAL LOW (ref 3.4–5.0)
Anion Gap: 3 — ABNORMAL LOW (ref 7–16)
EGFR (African American): >60
EGFR (Non-African Amer.): >60
Glucose: 86 mg/dL (ref 65–99)
Potassium: 4 mmol/L (ref 3.5–5.1)
SGPT (ALT): 29 U/L (ref 12–78)
Sodium: 136 mmol/L (ref 136–145)

## 2012-11-23 LAB — CANCER ANTIGEN 27.29: CA 27.29: 32.1 U/mL (ref 0.0–38.6)

## 2012-12-12 ENCOUNTER — Ambulatory Visit: Payer: Self-pay | Admitting: Oncology

## 2013-05-01 ENCOUNTER — Ambulatory Visit: Payer: Self-pay | Admitting: Specialist

## 2013-05-28 ENCOUNTER — Ambulatory Visit: Payer: Self-pay | Admitting: Oncology

## 2013-06-19 ENCOUNTER — Ambulatory Visit: Payer: Self-pay | Admitting: Oncology

## 2013-08-07 ENCOUNTER — Ambulatory Visit: Payer: Self-pay | Admitting: Oncology

## 2013-08-24 ENCOUNTER — Ambulatory Visit: Payer: Self-pay | Admitting: Specialist

## 2014-05-31 ENCOUNTER — Ambulatory Visit: Payer: Self-pay | Admitting: Oncology

## 2014-09-10 ENCOUNTER — Ambulatory Visit: Payer: Self-pay | Admitting: Internal Medicine

## 2014-10-06 NOTE — H&P (Signed)
PATIENT NAME:  [name], [name] MR#:  [id number] DATE OF BIRTH:  02/06/1925  DATE OF ADMISSION:  10/17/2011  REFERRING PHYSICIAN: [facility]  PRIMARY CARE PHYSICIAN: Fulton Reek, MD  REASON FOR ADMISSION: Acute respiratory failure with pneumonia.   HISTORY OF PRESENT ILLNESS: The patient is an 81 year old female with a history of breast cancer status post lumpectomy and adjuvant radiation therapy with history of recurrent deep venous thromboses, on chronic anticoagulation. The patient urgent care earlier today with a 4 to 5 day history of progressive shortness of breath associated with cough, fever, and anorexia. In the urgent care, the patient was profoundly hypoxic and tachypneic. She was sent for direct admission for her acute respiratory failure. Apparent chest x-ray at the urgent care revealed pneumonia.   PAST MEDICAL HISTORY:  1. Breast cancer status post left lumpectomy with adjuvant radiation therapy.  2. History of recurrent deep venous thromboses on anticoagulation.  3. Benign hypertension.  4. Hyperlipidemia.  5. Osteoporosis.  6. History of skin cancer.  7. Status post right knee surgery.  8. Status post thyroidectomy.  9. Status post cholecystectomy.   MEDICATIONS:  1. Inderal LA 60 mg p.o. daily.  2. Vitamin D 1000 units p.o. daily.  3. Norvasc 5 mg p.o. daily.  4. Coumadin 4 mg p.o. every p.m.   ALLERGIES: Morphine and penicillin.   SOCIAL HISTORY: Negative for alcohol or tobacco abuse. She is widowed and lives alone.   FAMILY HISTORY: Positive for stroke, hypertension, and breast cancer. Also Alzheimer's dementia.  REVIEW OF SYSTEMS: CONSTITUTIONAL: Has had fever but no change in weight. EYES: No blurred or double vision. No glaucoma. ENT: No tinnitus or hearing loss. No nasal discharge or bleeding. No difficulty swallowing. RESPIRATORY: The patient has had cough and wheezing. Denies hemoptysis. No painful respiration. CARDIOVASCULAR: No chest pain. No  orthopnea. No palpitations or syncope. GI: Some nausea but no vomiting or diarrhea. No change in bowel habits. GU: No dysuria or hematuria. No incontinence. ENDOCRINE: No polyuria or polydipsia. No heat or cold intolerance. HEMATOLOGIC: The patient denies anemia, easy bruising, or bleeding. LYMPHATIC: No swollen glands. MUSCULOSKELETAL: The patient denies pain in her neck, back, shoulders, knees, or hips. No gout. NEUROLOGIC: No numbness or migraines. Has generalized weakness. No stroke or seizures in the past. PSYCH: The patient denies anxiety, insomnia, or depression.   PHYSICAL EXAMINATION:   GENERAL: The patient is in moderate respiratory distress.   VITAL SIGNS: Vital signs are currently remarkable for a blood pressure of 150/80 with a heart rate of 96 and a respiratory rate of 24. Saturation is 76% on room air. Temperature is 99.4.   HEENT: Normocephalic, atraumatic. Pupils equally round and reactive to light and accommodation. Extraocular movements are intact. Sclerae are anicteric. Conjunctivae are clear. Oropharynx is dry but clear.   NECK: Supple without jugular venous distention or bruits. No adenopathy or thyromegaly is noted.   LUNGS: Decreased breath sounds with dullness at the bases. There are rhonchi. No wheezes or rales. Respiratory effort is increased.   CARDIAC: Regular rate and rhythm with normal S1 and S2. No significant rubs or gallops are noted.   ABDOMEN: Soft and nontender with normoactive bowel sounds. No organomegaly or masses were appreciated. No hernias or bruits were noted.   EXTREMITIES: No clubbing, cyanosis, or edema. Pulses were 2+ bilaterally.   SKIN: Warm and dry without rash or lesions.   NEUROLOGIC: Cranial nerves II through XII grossly intact. Deep tendon reflexes were symmetric. Motor and sensory  exam was nonfocal.   PSYCH: The patient was alert and oriented to person, place, and time. She was cooperative and used good judgment.   ASSESSMENT:   1. Acute respiratory failure.  2. Pneumonia.  3. Osteoporosis.  4. Benign hypertension.  5. Hyperlipidemia.  6. History of breast cancer.   PLAN: The patient will be admitted to the floor with IV steroids, IV antibiotics, and Xopenex and Atrovent SVNs. We will supplement oxygen and wean as tolerated. We will send off sputum for Gram stain and C and S. We will obtain follow-up chest x-ray here in the hospital as well as admission labs. We will follow her pro times closely. We will follow her sugars while on IV steroids. We will continue her Coumadin and her blood pressure regimen at this time. We will consult physical therapy because of the patient's profound weakness. We will also consult social services as the patient may require placement. Further treatment and evaluation will depend upon the patient's progress.   TOTAL TIME SPENT ON THIS PATIENT: 50 minutes.  ____________________________ Leonie Douglas Doy Hutching, MD jds:slb D: 10/17/2011 17:15:47 ET    T: 10/18/2011 08:49:11 ET       JOB#: [id number] cc: Leonie Douglas. Doy Hutching, MD, <Dictator> Rosaleen Mazer Lennice Sites MD ELECTRONICALLY SIGNED 10/18/2011 10:58

## 2014-10-06 NOTE — Discharge Summary (Signed)
PATIENT NAME:  [name], [name] MR#:  [id number] DATE OF BIRTH:  Jan 29, 1925  DATE OF ADMISSION:  10/17/2011 DATE OF DISCHARGE:  10/20/2011  REASON FOR ADMISSION: Acute respiratory failure with pneumonia.   HISTORY OF PRESENT ILLNESS: Please see the dictated history and physical done by myself on 10/17/2011.   PAST MEDICAL HISTORY:  1. Breast cancer status post left lumpectomy.  2. Recurrent deep venous thromboses, on anticoagulation.  3. Benign hypertension.  4. Hyperlipidemia.  5. Osteoporosis.  6. History of skin cancer.  7. Status post right knee surgery.  8. Status post thyroidectomy.  9. Status post cholecystectomy.   MEDICATIONS ON ADMISSION: Please see admission note.   ALLERGIES: Morphine and penicillin.   SOCIAL HISTORY, FAMILY HISTORY, AND REVIEW OF SYSTEMS: As per admission note.   PHYSICAL EXAMINATION: The patient was in moderate respiratory distress. Vital signs were remarkable for a blood pressure of 150/80 with a temperature of 99.4 and a respiratory rate of 24 with a heart rate of 96. Sat was 76% on room air. HEENT exam was unremarkable except for dry oropharynx. NECK was supple without JVD. LUNGS revealed decreased breath sounds with dullness at the bases. CARDIAC exam revealed a regular rate and rhythm with normal S1 and S2. ABDOMEN soft and nontender. EXTREMITIES without edema. NEUROLOGIC exam was grossly nonfocal.   HOSPITAL COURSE: The patient was admitted with acute respiratory failure with presumed pneumonia. Initial chest x-ray was unremarkable. CT scan of the chest, however, did show diffuse bilateral patchy pneumonia. She was treated with IV steroids with IV antibiotics as well as Xopenex and Atrovent SVNs with improvement of her respiratory status. Follow-up chest x-rays remained stable. Blood cultures were negative. She was subsequently switched to oral antibiotics and oral steroids with improvement of her symptoms. She was weaned off of oxygen. By 10/20/2011,  the patient was stable and ready for discharge.   DISCHARGE DIAGNOSES:  1. Acute respiratory failure.  2. Pneumonia.  3. Coagulopathy.  4. Recurrent deep venous thromboses, on anticoagulation.  5. Chronic anemia.  6. Benign hypertension.   DISCHARGE MEDICATIONS:  1. Norvasc 5 mg p.o. daily.  2. Inderal LA 60 mg p.o. daily.  3. Fosamax 70 mg p.o. q. week.  4. Coumadin 3 mg p.o. q.p.m.  5. Levaquin 250 mg p.o. daily x10 days.  6. Zithromax 250 mg p.o. daily x5 days.  7. Prednisone taper as directed.  8. Combivent 3 puffs q.i.d.  9. Protonix 40 mg p.o. b.i.d.  10. Norco 5/325 mg 1 to 2 p.o. q.4 hours p.r.n. pain.   FOLLOW-UP PLANS AND APPOINTMENTS:  1. The patient will be followed by home health.  2. She was discharged on a low sodium diet.  3. She will follow-up with me within one week's time period, sooner if needed.   ____________________________ Leonie Douglas Doy Hutching, MD jds:drc D: 10/27/2011 16:49:55 ET T: 10/28/2011 08:22:05 ET JOB#: [id number]  cc: Leonie Douglas. Doy Hutching, MD, <Dictator> Ava Tangney Lennice Sites MD ELECTRONICALLY SIGNED 10/28/2011 10:00

## 2015-08-06 ENCOUNTER — Other Ambulatory Visit: Payer: Self-pay | Admitting: Internal Medicine

## 2015-08-06 DIAGNOSIS — R51 Headache: Principal | ICD-10-CM

## 2015-08-06 DIAGNOSIS — R519 Headache, unspecified: Secondary | ICD-10-CM

## 2015-08-12 ENCOUNTER — Ambulatory Visit
Admission: RE | Admit: 2015-08-12 | Discharge: 2015-08-12 | Disposition: A | Discharge status: Home / Self Care | Payer: Medicare Other | Source: Ambulatory Visit | Attending: Internal Medicine | Admitting: Internal Medicine

## 2015-08-12 DIAGNOSIS — R519 Headache, unspecified: Secondary | ICD-10-CM

## 2015-08-12 DIAGNOSIS — R51 Headache: Secondary | ICD-10-CM | POA: Insufficient documentation

## 2015-08-12 DIAGNOSIS — G3189 Other specified degenerative diseases of nervous system: Secondary | ICD-10-CM | POA: Diagnosis not present

## 2015-08-22 ENCOUNTER — Other Ambulatory Visit: Payer: Self-pay | Admitting: Otolaryngology

## 2015-08-22 DIAGNOSIS — R42 Dizziness and giddiness: Secondary | ICD-10-CM

## 2015-08-25 ENCOUNTER — Other Ambulatory Visit: Payer: Self-pay | Admitting: Otolaryngology

## 2015-08-25 DIAGNOSIS — R42 Dizziness and giddiness: Secondary | ICD-10-CM

## 2015-09-12 ENCOUNTER — Ambulatory Visit
Admission: RE | Admit: 2015-09-12 | Discharge: 2015-09-12 | Disposition: A | Discharge status: Home / Self Care | Payer: Medicare Other | Source: Ambulatory Visit | Attending: Otolaryngology | Admitting: Otolaryngology

## 2015-09-12 DIAGNOSIS — I6523 Occlusion and stenosis of bilateral carotid arteries: Secondary | ICD-10-CM | POA: Diagnosis not present

## 2015-09-12 DIAGNOSIS — G319 Degenerative disease of nervous system, unspecified: Secondary | ICD-10-CM | POA: Diagnosis not present

## 2015-09-12 DIAGNOSIS — R51 Headache: Secondary | ICD-10-CM | POA: Diagnosis not present

## 2015-09-12 DIAGNOSIS — I739 Peripheral vascular disease, unspecified: Secondary | ICD-10-CM | POA: Insufficient documentation

## 2015-09-12 DIAGNOSIS — R42 Dizziness and giddiness: Secondary | ICD-10-CM

## 2015-09-12 LAB — POCT I-STAT CREATININE: CREATININE: 0.6 mg/dL (ref 0.44–1.00)

## 2015-09-12 MED ORDER — GADOBENATE DIMEGLUMINE 529 MG/ML IV SOLN
15.0000 mL | Freq: Once | INTRAVENOUS | Status: AC | PRN
  Administered 2015-09-12: 14 mL via INTRAVENOUS

## 2015-12-09 ENCOUNTER — Encounter: Payer: Self-pay | Admitting: *Deleted

## 2015-12-18 ENCOUNTER — Ambulatory Visit: Payer: Medicare Other | Admitting: Anesthesiology

## 2015-12-18 ENCOUNTER — Encounter
Admission: RE | Disposition: A | Discharge status: Home / Self Care | Payer: Self-pay | Source: Ambulatory Visit | Attending: Ophthalmology

## 2015-12-18 ENCOUNTER — Encounter: Payer: Self-pay | Admitting: Anesthesiology

## 2015-12-18 ENCOUNTER — Ambulatory Visit
Admission: RE | Admit: 2015-12-18 | Discharge: 2015-12-18 | Disposition: A | Discharge status: Home / Self Care | Payer: Medicare Other | Source: Ambulatory Visit | Attending: Ophthalmology | Admitting: Ophthalmology

## 2015-12-18 DIAGNOSIS — K219 Gastro-esophageal reflux disease without esophagitis: Secondary | ICD-10-CM | POA: Diagnosis not present

## 2015-12-18 DIAGNOSIS — R002 Palpitations: Secondary | ICD-10-CM | POA: Diagnosis not present

## 2015-12-18 DIAGNOSIS — R0602 Shortness of breath: Secondary | ICD-10-CM | POA: Insufficient documentation

## 2015-12-18 DIAGNOSIS — M810 Age-related osteoporosis without current pathological fracture: Secondary | ICD-10-CM | POA: Diagnosis not present

## 2015-12-18 DIAGNOSIS — R42 Dizziness and giddiness: Secondary | ICD-10-CM | POA: Diagnosis not present

## 2015-12-18 DIAGNOSIS — R251 Tremor, unspecified: Secondary | ICD-10-CM | POA: Insufficient documentation

## 2015-12-18 DIAGNOSIS — R05 Cough: Secondary | ICD-10-CM | POA: Insufficient documentation

## 2015-12-18 DIAGNOSIS — H2511 Age-related nuclear cataract, right eye: Secondary | ICD-10-CM | POA: Insufficient documentation

## 2015-12-18 DIAGNOSIS — M1990 Unspecified osteoarthritis, unspecified site: Secondary | ICD-10-CM | POA: Insufficient documentation

## 2015-12-18 DIAGNOSIS — Z79899 Other long term (current) drug therapy: Secondary | ICD-10-CM | POA: Insufficient documentation

## 2015-12-18 DIAGNOSIS — M7989 Other specified soft tissue disorders: Secondary | ICD-10-CM | POA: Diagnosis not present

## 2015-12-18 DIAGNOSIS — Z853 Personal history of malignant neoplasm of breast: Secondary | ICD-10-CM | POA: Insufficient documentation

## 2015-12-18 DIAGNOSIS — F329 Major depressive disorder, single episode, unspecified: Secondary | ICD-10-CM | POA: Insufficient documentation

## 2015-12-18 DIAGNOSIS — Z885 Allergy status to narcotic agent status: Secondary | ICD-10-CM | POA: Diagnosis not present

## 2015-12-18 DIAGNOSIS — H9190 Unspecified hearing loss, unspecified ear: Secondary | ICD-10-CM | POA: Diagnosis not present

## 2015-12-18 DIAGNOSIS — Z7901 Long term (current) use of anticoagulants: Secondary | ICD-10-CM | POA: Diagnosis not present

## 2015-12-18 DIAGNOSIS — R011 Cardiac murmur, unspecified: Secondary | ICD-10-CM | POA: Diagnosis not present

## 2015-12-18 HISTORY — DX: Reserved for inherently not codable concepts without codable children: IMO0001

## 2015-12-18 HISTORY — DX: Cardiac murmur, unspecified: R01.1

## 2015-12-18 HISTORY — DX: Palpitations: R00.2

## 2015-12-18 HISTORY — DX: Cough, unspecified: R05.9

## 2015-12-18 HISTORY — DX: Inflammatory liver disease, unspecified: K75.9

## 2015-12-18 HISTORY — DX: Unspecified hearing loss, unspecified ear: H91.90

## 2015-12-18 HISTORY — DX: Unspecified osteoarthritis, unspecified site: M19.90

## 2015-12-18 HISTORY — DX: Malignant (primary) neoplasm, unspecified: C80.1

## 2015-12-18 HISTORY — DX: Cough: R05

## 2015-12-18 HISTORY — DX: Depression, unspecified: F32.A

## 2015-12-18 HISTORY — DX: Gastro-esophageal reflux disease without esophagitis: K21.9

## 2015-12-18 HISTORY — DX: Dizziness and giddiness: R42

## 2015-12-18 HISTORY — DX: Major depressive disorder, single episode, unspecified: F32.9

## 2015-12-18 HISTORY — DX: Edema, unspecified: R60.9

## 2015-12-18 HISTORY — DX: Tremor, unspecified: R25.1

## 2015-12-18 HISTORY — PX: CATARACT EXTRACTION W/PHACO: SHX586

## 2015-12-18 SURGERY — PHACOEMULSIFICATION, CATARACT, WITH IOL INSERTION
Anesthesia: Monitor Anesthesia Care | Site: Eye | Laterality: Right | Wound class: Clean

## 2015-12-18 MED ORDER — CEFUROXIME OPHTHALMIC INJECTION 1 MG/0.1 ML
INJECTION | OPHTHALMIC | Status: AC
Start: 2015-12-18 — End: 2015-12-18
  Filled 2015-12-18: qty 0.1

## 2015-12-18 MED ORDER — EPINEPHRINE HCL 1 MG/ML IJ SOLN
INTRAOCULAR | Status: DC | PRN
  Administered 2015-12-18: 11:00:00 via OPHTHALMIC

## 2015-12-18 MED ORDER — TETRACAINE HCL 0.5 % OP SOLN
1.0000 [drp] | Freq: Once | OPHTHALMIC | Status: AC
  Administered 2015-12-18: 1 [drp] via OPHTHALMIC

## 2015-12-18 MED ORDER — CEFUROXIME OPHTHALMIC INJECTION 1 MG/0.1 ML
INJECTION | OPHTHALMIC | Status: DC | PRN
  Administered 2015-12-18: 0.1 mL via INTRACAMERAL

## 2015-12-18 MED ORDER — SODIUM HYALURONATE 23 MG/ML IO SOLN
INTRAOCULAR | Status: AC
  Filled 2015-12-18: qty 0.6

## 2015-12-18 MED ORDER — CARBACHOL 0.01 % IO SOLN
INTRAOCULAR | Status: DC | PRN
  Administered 2015-12-18: 0.5 mL via INTRAOCULAR

## 2015-12-18 MED ORDER — LIDOCAINE HCL (PF) 4 % IJ SOLN
INTRAMUSCULAR | Status: AC
  Filled 2015-12-18: qty 5

## 2015-12-18 MED ORDER — BSS IO SOLN
INTRAOCULAR | Status: DC | PRN
  Administered 2015-12-18: 11:00:00 via OPHTHALMIC

## 2015-12-18 MED ORDER — POVIDONE-IODINE 5 % OP SOLN
1.0000 "application " | Freq: Once | OPHTHALMIC | Status: AC
  Administered 2015-12-18: 1 via OPHTHALMIC

## 2015-12-18 MED ORDER — SODIUM CHLORIDE 0.9 % IV SOLN
INTRAVENOUS | Status: DC
  Administered 2015-12-18: 10:00:00 via INTRAVENOUS

## 2015-12-18 MED ORDER — BSS IO SOLN
INTRAOCULAR | Status: DC | PRN
  Administered 2015-12-18: 15 mL via INTRAOCULAR

## 2015-12-18 MED ORDER — EPINEPHRINE HCL 1 MG/ML IJ SOLN
INTRAMUSCULAR | Status: AC
  Filled 2015-12-18: qty 2

## 2015-12-18 MED ORDER — ARMC OPHTHALMIC DILATING GEL
1.0000 "application " | OPHTHALMIC | Status: DC | PRN
  Administered 2015-12-18: 1 via OPHTHALMIC

## 2015-12-18 MED ORDER — ALFENTANIL 500 MCG/ML IJ INJ
INJECTION | INTRAMUSCULAR | Status: DC | PRN
  Administered 2015-12-18: 250 ug via INTRAVENOUS

## 2015-12-18 MED ORDER — MIDAZOLAM HCL 2 MG/2ML IJ SOLN
INTRAMUSCULAR | Status: DC | PRN
  Administered 2015-12-18: 0.5 mg via INTRAVENOUS

## 2015-12-18 MED ORDER — MOXIFLOXACIN HCL 0.5 % OP SOLN: 1.0000 [drp] | OPHTHALMIC | Status: DC | PRN

## 2015-12-18 MED ORDER — SODIUM HYALURONATE 23 MG/ML IO SOLN
INTRAOCULAR | Status: DC | PRN
  Administered 2015-12-18: 0.6 mL via INTRAOCULAR

## 2015-12-18 MED ORDER — MOXIFLOXACIN HCL 0.5 % OP SOLN
OPHTHALMIC | Status: DC | PRN
  Administered 2015-12-18: 1 [drp] via OPHTHALMIC

## 2015-12-18 MED ORDER — SODIUM HYALURONATE 10 MG/ML IO SOLN
INTRAOCULAR | Status: DC | PRN
  Administered 2015-12-18: 0.85 mL via INTRAOCULAR

## 2015-12-18 SURGICAL SUPPLY — 27 items
CANNULA ANT/CHMB 27G (MISCELLANEOUS) ×2 IMPLANT
CANNULA ANT/CHMB 27GA (MISCELLANEOUS) ×4 IMPLANT
CUP MEDICINE 2OZ PLAST GRAD ST (MISCELLANEOUS) ×2 IMPLANT
DRAIN EYE SPONGE 80CC SGL (MISCELLANEOUS) ×1 IMPLANT
GLOVE BIO SURGEON STRL SZ8 (GLOVE) ×2 IMPLANT
GLOVE BIOGEL M 6.5 STRL (GLOVE) ×2 IMPLANT
GLOVE SURG LX 7.5 STRW (GLOVE) ×1
GLOVE SURG LX STRL 7.5 STRW (GLOVE) ×1 IMPLANT
GOWN STRL REUS W/ TWL LRG LVL3 (GOWN DISPOSABLE) ×2 IMPLANT
GOWN STRL REUS W/TWL LRG LVL3 (GOWN DISPOSABLE) ×4
KIT SLEEVE INFUSION .9 MICRO (MISCELLANEOUS) ×1 IMPLANT
LENS IOL ACRSF IQ PC 25.0 (Intraocular Lens) IMPLANT
LENS IOL ACRYSOF IQ POST 25.0 (Intraocular Lens) ×2 IMPLANT
PACK CATARACT (MISCELLANEOUS) ×2 IMPLANT
PACK CATARACT BRASINGTON LX (MISCELLANEOUS) ×2 IMPLANT
PACK EYE AFTER SURG (MISCELLANEOUS) ×2 IMPLANT
SOL BSS BAG (MISCELLANEOUS) ×2
SOL PREP PVP 2OZ (MISCELLANEOUS) ×2
SOLUTION BSS BAG (MISCELLANEOUS) ×1 IMPLANT
SOLUTION PREP PVP 2OZ (MISCELLANEOUS) ×1 IMPLANT
SPEAR PVA EYE SURG (MISCELLANEOUS) ×1 IMPLANT
SUT ETHILON 10 0 CS140 6 (SUTURE) ×1 IMPLANT
SYR 3ML LL SCALE MARK (SYRINGE) ×4 IMPLANT
SYR 5ML LL (SYRINGE) ×2 IMPLANT
SYR TB 1ML 27GX1/2 LL (SYRINGE) ×2 IMPLANT
WATER STERILE IRR 1000ML POUR (IV SOLUTION) ×2 IMPLANT
WIPE NON LINTING 3.25X3.25 (MISCELLANEOUS) ×2 IMPLANT

## 2015-12-18 NOTE — Op Note (Signed)
OPERATIVE NOTE  MARKAY DORSCHNER [id number] 12/18/2015   PREOPERATIVE DIAGNOSIS:  Nuclear sclerotic cataract right eye.  H25.11   POSTOPERATIVE DIAGNOSIS:    Nuclear sclerotic cataract right eye.     PROCEDURE:  Phacoemusification with posterior chamber intraocular lens placement of the right eye   LENS:   Implant Name Type Inv. Item Serial No. Manufacturer Lot No. LRB No. Used  IMPLANT LENS - [id number] Intraocular Lens IMPLANT LENS [id number] ALCON   Right 1       [id number] 25.0   ULTRASOUND TIME: 2 minutes 0 seconds.  CDE 17.61   SURGEON:  Benay Pillow, MD, MPH  ANESTHESIOLOGIST: Anesthesiologist: Gunnar Bulla, MD; Alvin Critchley, MD CRNA: Johnna Acosta, CRNA; Courtney Paris, CRNA   ANESTHESIA:  Topical with tetracaine drops and 2% Xylocaine jelly, augmented with 1% preservative-free intracameral lidocaine.  ESTIMATED BLOOD LOSS: less than 1 mL.   COMPLICATIONS:  None.   DESCRIPTION OF PROCEDURE:  The patient was identified in the holding room and transported to the operating room and placed in the supine position under the operating microscope.  The right eye was identified as the operative eye and it was prepped and draped in the usual sterile ophthalmic fashion.   A 1.0 millimeter clear-corneal paracentesis was made at the 9:30 position. 0.5 ml of preservative-free 1% lidocaine with epinephrine was injected into the anterior chamber.  The anterior chamber was filled with Healon5 viscoelastic.  A 2.4 millimeter keratome was used to make a near-clear corneal incision at the 8:00 position.  A curvilinear capsulorrhexis was made with a cystotome and capsulorrhexis forceps.  Balanced salt solution was used to hydrodissect and hydrodelineate the nucleus.  The orbit was deep and the palpebral fissure was narrow, a wick was placed to help prevent pooling of fluid around the eye.   Phacoemulsification was then used in stop and chop fashion to remove the lens nucleus and  epinucleus.  The remaining cortex was then removed using the irrigation and aspiration handpiece. Healon was then placed into the capsular bag to distend it for lens placement.  A lens was then injected into the capsular bag.  The remaining viscoelastic was aspirated.   Wounds were hydrated with balanced salt solution.  The anterior chamber was inflated to a physiologic pressure with balanced salt solution.    Intracameral cefuroxime 0.1 mL at 10mg /mL was injected into the eye.  The wounds were leaking.  It was challening to keep the anterior chamber formed, and access to the paraentesis was difficult.  Despite an appropriately sized rhexis, the lens would slightly prolapse anterior superiorly.  10-0 nylon sutures were placed through the temporal incision and the paraentesis.  At this point there were no wound leaks and the anterior chamber remained well formed with the lens centered. Topical Vigamox drops were applied to the eye.  The patient was taken to the recovery room in stable condition without complications of anesthesia or surgery  Benay Pillow 12/18/2015, 11:49 AM

## 2015-12-18 NOTE — OR Nursing (Signed)
Patient did not have her beta blocker, propranolol. Today. HR 57-58 bpm. Dr. Marcello Moores anesthesia notified and he wants [place] not to give her her beta blocker.

## 2015-12-18 NOTE — H&P (Signed)
  The History and Physical notes are on paper, have been signed, and are to be scanned. The patient remains stable and unchanged from the H&P.   Previous H&P reviewed, patient examined, and there are no changes.  Benay Pillow 12/18/2015 10:52 AM

## 2015-12-18 NOTE — Anesthesia Preprocedure Evaluation (Addendum)
Anesthesia Evaluation  Patient identified by MRN, date of birth, ID band Patient awake    Reviewed: Allergy & Precautions, NPO status , Patient's Chart, lab work & pertinent test results, reviewed documented beta blocker date and time   Airway Mallampati: II  TM Distance: >3 FB     Dental  (+) Chipped   Pulmonary shortness of breath,           Cardiovascular      Neuro/Psych PSYCHIATRIC DISORDERS Depression    GI/Hepatic GERD  Controlled,(+) Hepatitis -  Endo/Other    Renal/GU      Musculoskeletal  (+) Arthritis ,   Abdominal   Peds  Hematology   Anesthesia Other Findings IVC filter most prob on the L.  Reproductive/Obstetrics                            Anesthesia Physical Anesthesia Plan  ASA: III  Anesthesia Plan: MAC   Post-op Pain Management:    Induction:   Airway Management Planned:   Additional Equipment:   Intra-op Plan:   Post-operative Plan:   Informed Consent: I have reviewed the patients History and Physical, chart, labs and discussed the procedure including the risks, benefits and alternatives for the proposed anesthesia with the patient or authorized representative who has indicated his/her understanding and acceptance.     Plan Discussed with: CRNA  Anesthesia Plan Comments:         Anesthesia Quick Evaluation

## 2015-12-18 NOTE — H&P (Signed)
  The History and Physical notes are on paper, have been signed, and are to be scanned. The patient remains stable and unchanged from the H&P.   Previous H&P reviewed, patient examined, and there are no changes.  Benay Pillow 12/18/2015 10:18 AM

## 2015-12-18 NOTE — Transfer of Care (Signed)
Immediate Anesthesia Transfer of Care Note  Patient: [name]  Procedure(s) Performed: Procedure(s) with comments: CATARACT EXTRACTION PHACO AND INTRAOCULAR LENS PLACEMENT (IOC) (Right) - [place] 2.00 AP% 14.5 CDE 17.61 Fluid Pack lot # [id number]  Patient Location: PACU and Short Stay  Anesthesia Type:MAC  Level of Consciousness: awake, oriented and patient cooperative  Airway & Oxygen Therapy: Patient Spontanous Breathing  Post-op Assessment: Report given to RN and Post -op Vital signs reviewed and stable  Post vital signs: stable  Last Vitals:  Filed Vitals:   12/18/15 0951  BP: 166/69  Pulse: 58  Temp: 36.5 C  Resp: 16    Last Pain:  Filed Vitals:   12/18/15 0952  PainSc: 0-No pain         Complications: No apparent anesthesia complications

## 2015-12-18 NOTE — Anesthesia Procedure Notes (Signed)
Procedure Name: MAC Date/Time: 12/18/2015 11:01 AM Performed by: Johnna Acosta Pre-anesthesia Checklist: Patient identified, Emergency Drugs available, Suction available, Patient being monitored and Timeout performed Oxygen Delivery Method: Nasal cannula

## 2015-12-18 NOTE — Discharge Instructions (Signed)
Cataract Surgery, Care After °Refer to this sheet in the next few weeks. These instructions provide you with information on caring for yourself after your procedure. Your caregiver may also give you more specific instructions. Your treatment has been planned according to current medical practices, but problems sometimes occur. Call your caregiver if you have any problems or questions after your procedure.  °HOME CARE INSTRUCTIONS  °· Avoid strenuous activities as directed by your caregiver. °· Ask your caregiver when you can resume driving. °· Use eyedrops or other medicines to help healing and control pressure inside your eye as directed by your caregiver. °· Only take over-the-counter or prescription medicines for pain, discomfort, or fever as directed by your caregiver. °· Do not to touch or rub your eyes. °· You may be instructed to use a protective shield during the first few days and nights after surgery. If not, wear sunglasses to protect your eyes. This is to protect the eye from pressure or from being accidentally bumped. °· Keep the area around your eye clean and dry. Avoid swimming or allowing water to hit you directly in the face while showering. Keep soap and shampoo out of your eyes. °· Do not bend or lift heavy objects. Bending increases pressure in the eye. You can walk, climb stairs, and do light household chores. °· Do not put a contact lens into the eye that had surgery until your caregiver says it is okay to do so. °· Ask your doctor when you can return to work. This will depend on the kind of work that you do. If you work in a dusty environment, you may be advised to wear protective eyewear for a period of time. °· Ask your caregiver when it will be safe to engage in sexual activity. °· Continue with your regular eye exams as directed by your caregiver. °What to expect: °· It is normal to feel itching and mild discomfort for a few days after cataract surgery. Some fluid discharge is also common,  and your eye may be sensitive to light and touch. °· After 1 to 2 days, even moderate discomfort should disappear. In most cases, healing will take about 6 weeks. °· If you received an intraocular lens (IOL), you may notice that colors are very bright or have a blue tinge. Also, if you have been in bright sunlight, everything may appear reddish for a few hours. If you see these color tinges, it is because your lens is clear and no longer cloudy. Within a few months after receiving an IOL, these extra colors should go away. When you have healed, you will probably need new glasses. °SEEK MEDICAL CARE IF:  °· You have increased bruising around your eye. °· You have discomfort not helped by medicine. °SEEK IMMEDIATE MEDICAL CARE IF:  °· You have a  fever. °· You have a worsening or sudden vision loss. °· You have redness, swelling, or increasing pain in the eye. °· You have a thick discharge from the eye that had surgery. °MAKE SURE YOU: °· Understand these instructions. °· Will watch your condition. °· Will get help right away if you are not doing well or get worse. °  °This information is not intended to replace advice given to you by your health care provider. Make sure you discuss any questions you have with your health care provider. °  °Document Released: 12/18/2004 Document Revised: 06/21/2014 Document Reviewed: 01/22/2011 °Elsevier Interactive Patient Education ©2016 [employer] ° °

## 2015-12-20 NOTE — Anesthesia Postprocedure Evaluation (Signed)
Anesthesia Post Note  Patient: [name]  Procedure(s) Performed: Procedure(s) (LRB): CATARACT EXTRACTION PHACO AND INTRAOCULAR LENS PLACEMENT (IOC) (Right)  Patient location during evaluation: PACU Anesthesia Type: MAC Level of consciousness: awake and alert and oriented Pain management: pain level controlled Vital Signs Assessment: post-procedure vital signs reviewed and stable Respiratory status: spontaneous breathing Cardiovascular status: blood pressure returned to baseline Anesthetic complications: no    Last Vitals:  Filed Vitals:   12/18/15 1153 12/18/15 1224  BP: 187/69 155/56  Pulse: 62 65  Temp: 36.6 C   Resp: 16 16    Last Pain:  Filed Vitals:   12/19/15 0835  PainSc: 0-No pain                 Judyth Demarais

## 2016-02-02 ENCOUNTER — Encounter: Payer: Self-pay | Admitting: *Deleted

## 2016-02-05 ENCOUNTER — Ambulatory Visit
Admission: RE | Admit: 2016-02-05 | Discharge: 2016-02-05 | Disposition: A | Discharge status: Home / Self Care | Payer: Medicare Other | Source: Ambulatory Visit | Attending: Ophthalmology | Admitting: Ophthalmology

## 2016-02-05 ENCOUNTER — Encounter
Admission: RE | Disposition: A | Discharge status: Home / Self Care | Payer: Self-pay | Source: Ambulatory Visit | Attending: Ophthalmology

## 2016-02-05 ENCOUNTER — Encounter: Payer: Self-pay | Admitting: *Deleted

## 2016-02-05 ENCOUNTER — Ambulatory Visit: Payer: Medicare Other | Admitting: Anesthesiology

## 2016-02-05 DIAGNOSIS — Z9841 Cataract extraction status, right eye: Secondary | ICD-10-CM | POA: Insufficient documentation

## 2016-02-05 DIAGNOSIS — R002 Palpitations: Secondary | ICD-10-CM | POA: Diagnosis not present

## 2016-02-05 DIAGNOSIS — Z7901 Long term (current) use of anticoagulants: Secondary | ICD-10-CM | POA: Insufficient documentation

## 2016-02-05 DIAGNOSIS — Z880 Allergy status to penicillin: Secondary | ICD-10-CM | POA: Insufficient documentation

## 2016-02-05 DIAGNOSIS — K219 Gastro-esophageal reflux disease without esophagitis: Secondary | ICD-10-CM | POA: Diagnosis not present

## 2016-02-05 DIAGNOSIS — Z885 Allergy status to narcotic agent status: Secondary | ICD-10-CM | POA: Insufficient documentation

## 2016-02-05 DIAGNOSIS — M810 Age-related osteoporosis without current pathological fracture: Secondary | ICD-10-CM | POA: Insufficient documentation

## 2016-02-05 DIAGNOSIS — Z853 Personal history of malignant neoplasm of breast: Secondary | ICD-10-CM | POA: Diagnosis not present

## 2016-02-05 DIAGNOSIS — R05 Cough: Secondary | ICD-10-CM | POA: Insufficient documentation

## 2016-02-05 DIAGNOSIS — Z7982 Long term (current) use of aspirin: Secondary | ICD-10-CM | POA: Diagnosis not present

## 2016-02-05 DIAGNOSIS — Z79899 Other long term (current) drug therapy: Secondary | ICD-10-CM | POA: Diagnosis not present

## 2016-02-05 DIAGNOSIS — R251 Tremor, unspecified: Secondary | ICD-10-CM | POA: Insufficient documentation

## 2016-02-05 DIAGNOSIS — F329 Major depressive disorder, single episode, unspecified: Secondary | ICD-10-CM | POA: Insufficient documentation

## 2016-02-05 DIAGNOSIS — H2512 Age-related nuclear cataract, left eye: Secondary | ICD-10-CM | POA: Diagnosis not present

## 2016-02-05 DIAGNOSIS — R011 Cardiac murmur, unspecified: Secondary | ICD-10-CM | POA: Diagnosis not present

## 2016-02-05 DIAGNOSIS — M1990 Unspecified osteoarthritis, unspecified site: Secondary | ICD-10-CM | POA: Diagnosis not present

## 2016-02-05 DIAGNOSIS — H9190 Unspecified hearing loss, unspecified ear: Secondary | ICD-10-CM | POA: Diagnosis not present

## 2016-02-05 DIAGNOSIS — M7989 Other specified soft tissue disorders: Secondary | ICD-10-CM | POA: Insufficient documentation

## 2016-02-05 HISTORY — PX: CATARACT EXTRACTION W/PHACO: SHX586

## 2016-02-05 SURGERY — PHACOEMULSIFICATION, CATARACT, WITH IOL INSERTION
Anesthesia: Monitor Anesthesia Care | Site: Eye | Laterality: Left | Wound class: Clean

## 2016-02-05 MED ORDER — EPINEPHRINE HCL 1 MG/ML IJ SOLN
INTRAMUSCULAR | Status: DC | PRN
  Administered 2016-02-05: 1 mL via OPHTHALMIC

## 2016-02-05 MED ORDER — ARMC OPHTHALMIC DILATING GEL
OPHTHALMIC | Status: AC
  Administered 2016-02-05: 1 via OPHTHALMIC
  Filled 2016-02-05: qty 0.25

## 2016-02-05 MED ORDER — MOXIFLOXACIN HCL 0.5 % OP SOLN: 1.0000 [drp] | OPHTHALMIC | Status: DC | PRN

## 2016-02-05 MED ORDER — EPINEPHRINE HCL 1 MG/ML IJ SOLN
INTRAMUSCULAR | Status: AC
  Filled 2016-02-05: qty 2

## 2016-02-05 MED ORDER — ALFENTANIL 500 MCG/ML IJ INJ
INJECTION | INTRAMUSCULAR | Status: DC | PRN
  Administered 2016-02-05: 500 ug via INTRAVENOUS

## 2016-02-05 MED ORDER — POVIDONE-IODINE 5 % OP SOLN
OPHTHALMIC | Status: AC
  Administered 2016-02-05: 1 via OPHTHALMIC
  Filled 2016-02-05: qty 30

## 2016-02-05 MED ORDER — TETRACAINE HCL 0.5 % OP SOLN
1.0000 [drp] | Freq: Once | OPHTHALMIC | Status: AC
  Administered 2016-02-05: 1 [drp] via OPHTHALMIC

## 2016-02-05 MED ORDER — SODIUM HYALURONATE 10 MG/ML IO SOLN
INTRAOCULAR | Status: DC | PRN
  Administered 2016-02-05: .85 mL via INTRAOCULAR

## 2016-02-05 MED ORDER — LIDOCAINE HCL (PF) 4 % IJ SOLN
INTRAMUSCULAR | Status: AC
  Filled 2016-02-05: qty 5

## 2016-02-05 MED ORDER — SODIUM HYALURONATE 23 MG/ML IO SOLN
INTRAOCULAR | Status: AC
  Filled 2016-02-05: qty 0.6

## 2016-02-05 MED ORDER — SODIUM CHLORIDE 0.9 % IV SOLN
INTRAVENOUS | Status: DC
  Administered 2016-02-05: 10:00:00 via INTRAVENOUS

## 2016-02-05 MED ORDER — ARMC OPHTHALMIC DILATING GEL
1.0000 "application " | OPHTHALMIC | Status: AC | PRN
  Administered 2016-02-05 (×2): 1 via OPHTHALMIC

## 2016-02-05 MED ORDER — MOXIFLOXACIN HCL 0.5 % OP SOLN
OPHTHALMIC | Status: AC
  Filled 2016-02-05: qty 3

## 2016-02-05 MED ORDER — MOXIFLOXACIN HCL 0.5 % OP SOLN
OPHTHALMIC | Status: DC | PRN
  Administered 2016-02-05: 1 [drp] via OPHTHALMIC

## 2016-02-05 MED ORDER — BUPIVACAINE HCL (PF) 0.75 % IJ SOLN
INTRAMUSCULAR | Status: AC
  Filled 2016-02-05: qty 10

## 2016-02-05 MED ORDER — SODIUM HYALURONATE 23 MG/ML IO SOLN
INTRAOCULAR | Status: DC | PRN
  Administered 2016-02-05: .6 mL via INTRAOCULAR

## 2016-02-05 MED ORDER — BUPIVACAINE HCL (PF) 0.75 % IJ SOLN
INTRAMUSCULAR | Status: DC | PRN
  Administered 2016-02-05: 3 mL via OPHTHALMIC

## 2016-02-05 MED ORDER — LIDOCAINE HCL (PF) 4 % IJ SOLN
INTRAOCULAR | Status: DC | PRN
  Administered 2016-02-05: .5 mL via OPHTHALMIC

## 2016-02-05 MED ORDER — LIDOCAINE HCL (PF) 4 % IJ SOLN
INTRAMUSCULAR | Status: AC
Start: 2016-02-05 — End: 2016-02-05
  Filled 2016-02-05: qty 5

## 2016-02-05 MED ORDER — TETRACAINE HCL 0.5 % OP SOLN
OPHTHALMIC | Status: AC
  Administered 2016-02-05: 1 [drp] via OPHTHALMIC
  Filled 2016-02-05: qty 2

## 2016-02-05 MED ORDER — POVIDONE-IODINE 5 % OP SOLN
1.0000 "application " | Freq: Once | OPHTHALMIC | Status: AC
  Administered 2016-02-05: 1 via OPHTHALMIC

## 2016-02-05 MED ORDER — SODIUM HYALURONATE 10 MG/ML IO SOLN
INTRAOCULAR | Status: AC
  Filled 2016-02-05: qty 0.85

## 2016-02-05 SURGICAL SUPPLY — 24 items
CANNULA ANT/CHMB 27G (MISCELLANEOUS) ×2 IMPLANT
CANNULA ANT/CHMB 27GA (MISCELLANEOUS) ×6 IMPLANT
CUP MEDICINE 2OZ PLAST GRAD ST (MISCELLANEOUS) ×3 IMPLANT
DRAIN EYE SPONGE 80CC SGL (MISCELLANEOUS) ×2 IMPLANT
GLOVE BIO SURGEON STRL SZ8 (GLOVE) ×3 IMPLANT
GLOVE BIOGEL M 6.5 STRL (GLOVE) ×3 IMPLANT
GLOVE SURG LX 7.5 STRW (GLOVE) ×2
GLOVE SURG LX STRL 7.5 STRW (GLOVE) ×1 IMPLANT
GOWN STRL REUS W/ TWL LRG LVL3 (GOWN DISPOSABLE) ×2 IMPLANT
GOWN STRL REUS W/TWL LRG LVL3 (GOWN DISPOSABLE) ×6
LENS IOL ACRSF IQ PC 24.5 (Intraocular Lens) IMPLANT
LENS IOL ACRYSOF IQ POST 24.5 (Intraocular Lens) ×3 IMPLANT
PACK CATARACT (MISCELLANEOUS) ×3 IMPLANT
PACK CATARACT BRASINGTON LX (MISCELLANEOUS) ×3 IMPLANT
PACK EYE AFTER SURG (MISCELLANEOUS) ×3 IMPLANT
SOL BSS BAG (MISCELLANEOUS) ×3
SOL PREP PVP 2OZ (MISCELLANEOUS) ×3
SOLUTION BSS BAG (MISCELLANEOUS) ×1 IMPLANT
SOLUTION PREP PVP 2OZ (MISCELLANEOUS) ×1 IMPLANT
SYR 3ML LL SCALE MARK (SYRINGE) ×6 IMPLANT
SYR 5ML LL (SYRINGE) ×3 IMPLANT
SYR TB 1ML 27GX1/2 LL (SYRINGE) ×3 IMPLANT
WATER STERILE IRR 250ML POUR (IV SOLUTION) ×3 IMPLANT
WIPE NON LINTING 3.25X3.25 (MISCELLANEOUS) ×3 IMPLANT

## 2016-02-05 NOTE — H&P (Signed)
  The History and Physical notes are on paper, have been signed, and are to be scanned. The patient remains stable and unchanged from the H&P.   Previous H&P reviewed, patient examined, and there are no changes.  Benay Pillow 02/05/2016 10:16 AM

## 2016-02-05 NOTE — Transfer of Care (Signed)
Immediate Anesthesia Transfer of Care Note  Patient: [name]  Procedure(s) Performed: Procedure(s) with comments: CATARACT EXTRACTION PHACO AND INTRAOCULAR LENS PLACEMENT (IOC) (Left) - [place] 01:23 AP% 8.1 CDE 6.83 Fluid  pack lot # [id number]  Patient Location: PACU  Anesthesia Type:MAC  Level of Consciousness: awake, alert , oriented and patient cooperative  Airway & Oxygen Therapy: Patient Spontanous Breathing  Post-op Assessment: Report given to RN, Post -op Vital signs reviewed and stable and Patient moving all extremities X 4  Post vital signs: Reviewed and stable  Last Vitals:  Vitals:   02/05/16 0931  BP: (!) 156/78  Pulse: (!) 52  Resp: 20  Temp: 36.7 C    Last Pain: There were no vitals filed for this visit.       Complications: No apparent anesthesia complications

## 2016-02-05 NOTE — Discharge Instructions (Signed)
Eye Surgery Discharge Instructions  Expect mild scratchy sensation or mild soreness. DO NOT RUB YOUR EYE!  The day of surgery:  Minimal physical activity, but bed rest is not required  No reading, computer work, or close hand work  No bending, lifting, or straining.  May watch TV  For 24 hours:  No driving, legal decisions, or alcoholic beverages  Safety precautions  Eat anything you prefer: It is better to start with liquids, then soup then solid foods.  _____ Eye patch should be worn until postoperative exam tomorrow.  ____ Solar shield eyeglasses should be worn for comfort in the sunlight/patch while sleeping  Resume all regular medications including aspirin or Coumadin if these were discontinued prior to surgery. You may shower, bathe, shave, or wash your hair. Tylenol may be taken for mild discomfort.  Call your doctor if you experience significant pain, nausea, or vomiting, fever > 101 or other signs of infection. [phone] or [phone] Specific instructions:  Follow-up Information    Benay Pillow, MD .   Specialty:  Ophthalmology Why:  02/06/16 at 9:40 am. Contact information: [address] [place] [address] [phone]

## 2016-02-05 NOTE — Op Note (Signed)
OPERATIVE NOTE  [name] [id number] 02/05/2016   PREOPERATIVE DIAGNOSIS:  Nuclear sclerotic cataract left eye.  H25.12   POSTOPERATIVE DIAGNOSIS:    Nuclear sclerotic cataract left eye.     PROCEDURE:  Phacoemusification with posterior chamber intraocular lens placement of the left eye   LENS:   Implant Name Type Inv. Item Serial No. Manufacturer Lot No. LRB No. Used  IMPLANT LENS - [id number] Intraocular Lens IMPLANT LENS [id number] ALCON   Left 1       [id number] 24.5   ULTRASOUND TIME: 1 minutes 23 seconds.  CDE 6.83   SURGEON:  Benay Pillow, MD, MPH   ANESTHESIA:  Topical with tetracaine drops and 2% Xylocaine jelly, augmented with 1% preservative-free intracameral lidocaine.   COMPLICATIONS:  None.   DESCRIPTION OF PROCEDURE:  The patient was identified in the holding room and transported to the operating room and placed in the supine position under the operating microscope.  The left eye was identified as the operative eye and it was prepped and draped in the usual sterile ophthalmic fashion.   A 1.0 millimeter clear-corneal paracentesis was made at the 5:00 position. 0.5 ml of preservative-free 1% lidocaine with epinephrine was injected into the anterior chamber.  The anterior chamber was filled with Healon 5 viscoelastic.  A 2.4 millimeter keratome was used to make a near-clear corneal incision at the 2:00 position.  A curvilinear capsulorrhexis was made with a cystotome and capsulorrhexis forceps.  Balanced salt solution was used to hydrodissect and hydrodelineate the nucleus.   Phacoemulsification was then used in stop and chop fashion to remove the lens nucleus and epinucleus.  The remaining cortex was then removed using the irrigation and aspiration handpiece. Healon was then placed into the capsular bag to distend it for lens placement.  A lens was then injected into the capsular bag.  The remaining viscoelastic was aspirated.   Wounds were hydrated with balanced  salt solution.  The anterior chamber was inflated to a physiologic pressure with balanced salt solution.  Intracameral vigamox 0.1 mL undiltued was injected into the eye. No wound leaks were noted.  Topical Vigamox drops were applied to the eye.  The patient was taken to the recovery room in stable condition without complications of anesthesia or surgery  Benay Pillow 02/05/2016, 11:39 AM

## 2016-02-05 NOTE — Anesthesia Postprocedure Evaluation (Signed)
Anesthesia Post Note  Patient: [name]  Procedure(s) Performed: Procedure(s) (LRB): CATARACT EXTRACTION PHACO AND INTRAOCULAR LENS PLACEMENT (IOC) (Left)  Patient location during evaluation: PACU Anesthesia Type: MAC Level of consciousness: awake and alert, patient cooperative and oriented Pain management: satisfactory to patient Vital Signs Assessment: post-procedure vital signs reviewed and stable Respiratory status: respiratory function stable Cardiovascular status: stable Anesthetic complications: no    Last Vitals:  Vitals:   02/05/16 0931  BP: (!) 156/78  Pulse: (!) 52  Resp: 20  Temp: 36.7 C    Last Pain: There were no vitals filed for this visit.               Silvana Newness A

## 2016-02-05 NOTE — Anesthesia Preprocedure Evaluation (Signed)
Anesthesia Evaluation  Patient identified by MRN, date of birth, ID band Patient awake    Reviewed: Allergy & Precautions, NPO status , Patient's Chart, lab work & pertinent test results, reviewed documented beta blocker date and time   History of Anesthesia Complications Negative for: history of anesthetic complications  Airway Mallampati: II  TM Distance: >3 FB     Dental  (+) Chipped   Pulmonary shortness of breath, neg sleep apnea, neg COPD, neg recent URI,           Cardiovascular + Valvular Problems/Murmurs      Neuro/Psych PSYCHIATRIC DISORDERS Depression negative neurological ROS     GI/Hepatic GERD  Controlled,(+) Hepatitis -  Endo/Other  negative endocrine ROS  Renal/GU negative Renal ROS  negative genitourinary   Musculoskeletal  (+) Arthritis ,   Abdominal   Peds  Hematology negative hematology ROS (+)   Anesthesia Other Findings IVC filter most prob on the L.  Reproductive/Obstetrics negative OB ROS                             Anesthesia Physical  Anesthesia Plan  ASA: III  Anesthesia Plan: MAC   Post-op Pain Management:    Induction:   Airway Management Planned:   Additional Equipment:   Intra-op Plan:   Post-operative Plan:   Informed Consent: I have reviewed the patients History and Physical, chart, labs and discussed the procedure including the risks, benefits and alternatives for the proposed anesthesia with the patient or authorized representative who has indicated his/her understanding and acceptance.     Plan Discussed with: CRNA  Anesthesia Plan Comments:         Anesthesia Quick Evaluation

## 2016-02-06 ENCOUNTER — Encounter: Payer: Self-pay | Admitting: Ophthalmology

## 2016-02-10 ENCOUNTER — Emergency Department: Payer: Medicare Other

## 2016-02-10 ENCOUNTER — Encounter: Payer: Self-pay | Admitting: Emergency Medicine

## 2016-02-10 ENCOUNTER — Inpatient Hospital Stay
Admission: EM | Admit: 2016-02-10 | Discharge: 2016-02-13 | DRG: 481 | Disposition: A | Discharge status: Skilled Nursing Facility | Payer: Medicare Other | Attending: Internal Medicine | Admitting: Internal Medicine

## 2016-02-10 DIAGNOSIS — I10 Essential (primary) hypertension: Secondary | ICD-10-CM | POA: Diagnosis present

## 2016-02-10 DIAGNOSIS — S72142A Displaced intertrochanteric fracture of left femur, initial encounter for closed fracture: Secondary | ICD-10-CM | POA: Diagnosis present

## 2016-02-10 DIAGNOSIS — M810 Age-related osteoporosis without current pathological fracture: Secondary | ICD-10-CM | POA: Diagnosis present

## 2016-02-10 DIAGNOSIS — E785 Hyperlipidemia, unspecified: Secondary | ICD-10-CM | POA: Diagnosis present

## 2016-02-10 DIAGNOSIS — H9190 Unspecified hearing loss, unspecified ear: Secondary | ICD-10-CM | POA: Diagnosis present

## 2016-02-10 DIAGNOSIS — Z823 Family history of stroke: Secondary | ICD-10-CM

## 2016-02-10 DIAGNOSIS — M6282 Rhabdomyolysis: Secondary | ICD-10-CM | POA: Diagnosis present

## 2016-02-10 DIAGNOSIS — S72002A Fracture of unspecified part of neck of left femur, initial encounter for closed fracture: Secondary | ICD-10-CM

## 2016-02-10 DIAGNOSIS — K219 Gastro-esophageal reflux disease without esophagitis: Secondary | ICD-10-CM | POA: Diagnosis present

## 2016-02-10 DIAGNOSIS — Y92000 Kitchen of unspecified non-institutional (private) residence as  the place of occurrence of the external cause: Secondary | ICD-10-CM | POA: Diagnosis not present

## 2016-02-10 DIAGNOSIS — Z880 Allergy status to penicillin: Secondary | ICD-10-CM | POA: Diagnosis not present

## 2016-02-10 DIAGNOSIS — Z885 Allergy status to narcotic agent status: Secondary | ICD-10-CM | POA: Diagnosis not present

## 2016-02-10 DIAGNOSIS — Z961 Presence of intraocular lens: Secondary | ICD-10-CM | POA: Diagnosis present

## 2016-02-10 DIAGNOSIS — F329 Major depressive disorder, single episode, unspecified: Secondary | ICD-10-CM | POA: Diagnosis present

## 2016-02-10 DIAGNOSIS — Z7901 Long term (current) use of anticoagulants: Secondary | ICD-10-CM | POA: Diagnosis not present

## 2016-02-10 DIAGNOSIS — Z853 Personal history of malignant neoplasm of breast: Secondary | ICD-10-CM

## 2016-02-10 DIAGNOSIS — D72829 Elevated white blood cell count, unspecified: Secondary | ICD-10-CM | POA: Diagnosis present

## 2016-02-10 DIAGNOSIS — Z9842 Cataract extraction status, left eye: Secondary | ICD-10-CM | POA: Diagnosis not present

## 2016-02-10 DIAGNOSIS — I4891 Unspecified atrial fibrillation: Secondary | ICD-10-CM | POA: Diagnosis present

## 2016-02-10 DIAGNOSIS — W010XXA Fall on same level from slipping, tripping and stumbling without subsequent striking against object, initial encounter: Secondary | ICD-10-CM | POA: Diagnosis present

## 2016-02-10 DIAGNOSIS — Z8249 Family history of ischemic heart disease and other diseases of the circulatory system: Secondary | ICD-10-CM

## 2016-02-10 DIAGNOSIS — Z419 Encounter for procedure for purposes other than remedying health state, unspecified: Secondary | ICD-10-CM

## 2016-02-10 HISTORY — DX: Essential (primary) hypertension: I10

## 2016-02-10 HISTORY — DX: Age-related osteoporosis without current pathological fracture: M81.0

## 2016-02-10 HISTORY — DX: Hyperlipidemia, unspecified: E78.5

## 2016-02-10 HISTORY — DX: Acute embolism and thrombosis of unspecified deep veins of unspecified lower extremity: I82.409

## 2016-02-10 LAB — CBC WITH DIFFERENTIAL/PLATELET
BASOS PCT: 0 %
BLASTS: 0 %
Band Neutrophils: 0 %
Basophils Absolute: 0 10*3/uL (ref 0–0.1)
EOS ABS: 0 10*3/uL (ref 0–0.7)
Eosinophils Relative: 0 %
HEMATOCRIT: 41.5 % (ref 35.0–47.0)
HEMOGLOBIN: 14 g/dL (ref 12.0–16.0)
Lymphocytes Relative: 3 %
Lymphs Abs: 0.8 10*3/uL — ABNORMAL LOW (ref 1.0–3.6)
MCH: 27.8 pg (ref 26.0–34.0)
MCHC: 33.7 g/dL (ref 32.0–36.0)
MCV: 82.6 fL (ref 80.0–100.0)
MONO ABS: 2.6 10*3/uL — AB (ref 0.2–0.9)
MYELOCYTES: 0 %
Metamyelocytes Relative: 0 %
Monocytes Relative: 10 %
NEUTROS PCT: 87 %
NRBC: 0 /100{WBCs}
Neutro Abs: 22.4 10*3/uL — ABNORMAL HIGH (ref 1.4–6.5)
Other: 0 %
PROMYELOCYTES ABS: 0 %
Platelets: 368 10*3/uL (ref 150–440)
RBC: 5.02 MIL/uL (ref 3.80–5.20)
RDW: 15.4 % — ABNORMAL HIGH (ref 11.5–14.5)
WBC: 25.8 10*3/uL — ABNORMAL HIGH (ref 3.6–11.0)

## 2016-02-10 LAB — BASIC METABOLIC PANEL
Anion gap: 9 (ref 5–15)
BUN: 26 mg/dL — AB (ref 6–20)
CALCIUM: 9.3 mg/dL (ref 8.9–10.3)
CO2: 24 mmol/L (ref 22–32)
CREATININE: 0.67 mg/dL (ref 0.44–1.00)
Chloride: 101 mmol/L (ref 101–111)
GFR calc Af Amer: >60 mL/min (ref >60)
GFR calc non Af Amer: >60 mL/min (ref >60)
GLUCOSE: 169 mg/dL — AB (ref 65–99)
Potassium: 4.3 mmol/L (ref 3.5–5.1)
Sodium: 134 mmol/L — ABNORMAL LOW (ref 135–145)

## 2016-02-10 LAB — URINALYSIS COMPLETE WITH MICROSCOPIC (ARMC ONLY)
Bilirubin Urine: NEGATIVE
Glucose, UA: NEGATIVE mg/dL
Leukocytes, UA: NEGATIVE
NITRITE: NEGATIVE
PROTEIN: 30 mg/dL — AB
SPECIFIC GRAVITY, URINE: 1.026 (ref 1.005–1.030)
pH: 5 (ref 5.0–8.0)

## 2016-02-10 LAB — PROTIME-INR
INR: 1.4
Prothrombin Time: 17.3 seconds — ABNORMAL HIGH (ref 11.4–15.2)

## 2016-02-10 LAB — CK: Total CK: 1794 U/L — ABNORMAL HIGH (ref 38–234)

## 2016-02-10 LAB — SURGICAL PCR SCREEN
MRSA, PCR: NEGATIVE
STAPHYLOCOCCUS AUREUS: NEGATIVE

## 2016-02-10 MED ORDER — ONDANSETRON HCL 4 MG/2ML IJ SOLN
4.0000 mg | Freq: Once | INTRAMUSCULAR | Status: AC
  Administered 2016-02-10: 4 mg via INTRAVENOUS
  Filled 2016-02-10: qty 2

## 2016-02-10 MED ORDER — ACETAMINOPHEN 325 MG PO TABS: 650.0000 mg | ORAL_TABLET | Freq: Four times a day (QID) | ORAL | Status: DC | PRN

## 2016-02-10 MED ORDER — FENTANYL CITRATE (PF) 100 MCG/2ML IJ SOLN
50.0000 ug | Freq: Once | INTRAMUSCULAR | Status: AC
  Administered 2016-02-10: 50 ug via INTRAVENOUS
  Filled 2016-02-10: qty 2

## 2016-02-10 MED ORDER — SODIUM CHLORIDE 0.9 % IV BOLUS (SEPSIS)
1000.0000 mL | Freq: Once | INTRAVENOUS | Status: AC
  Administered 2016-02-10: 1000 mL via INTRAVENOUS

## 2016-02-10 MED ORDER — CLINDAMYCIN PHOSPHATE 600 MG/50ML IV SOLN
600.0000 mg | INTRAVENOUS | Status: AC
  Administered 2016-02-11: 600 mg via INTRAVENOUS
  Filled 2016-02-10: qty 50

## 2016-02-10 MED ORDER — ONDANSETRON HCL 4 MG PO TABS: 4.0000 mg | ORAL_TABLET | Freq: Four times a day (QID) | ORAL | Status: DC | PRN

## 2016-02-10 MED ORDER — SENNA 8.6 MG PO TABS
1.0000 | ORAL_TABLET | Freq: Two times a day (BID) | ORAL | Status: DC
  Administered 2016-02-10: 8.6 mg via ORAL
  Filled 2016-02-10: qty 1

## 2016-02-10 MED ORDER — ONDANSETRON HCL 4 MG/2ML IJ SOLN: 4.0000 mg | Freq: Four times a day (QID) | INTRAMUSCULAR | Status: DC | PRN

## 2016-02-10 MED ORDER — POLYETHYLENE GLYCOL 3350 17 G PO PACK: 17.0000 g | PACK | Freq: Every day | ORAL | Status: DC | PRN

## 2016-02-10 MED ORDER — ALBUTEROL SULFATE (2.5 MG/3ML) 0.083% IN NEBU
2.5000 mg | INHALATION_SOLUTION | RESPIRATORY_TRACT | Status: DC | PRN
Start: 2016-02-10 — End: 2016-02-13

## 2016-02-10 MED ORDER — SODIUM CHLORIDE 0.9 % IV SOLN
INTRAVENOUS | Status: DC
  Administered 2016-02-10 – 2016-02-11 (×2): via INTRAVENOUS

## 2016-02-10 MED ORDER — OXYCODONE HCL 5 MG PO TABS
5.0000 mg | ORAL_TABLET | ORAL | Status: DC | PRN
  Administered 2016-02-10 – 2016-02-11 (×3): 5 mg via ORAL
  Filled 2016-02-10 (×3): qty 1

## 2016-02-10 MED ORDER — BISACODYL 10 MG RE SUPP: 10.0000 mg | Freq: Every day | RECTAL | Status: DC | PRN

## 2016-02-10 MED ORDER — MOXIFLOXACIN HCL 0.5 % OP SOLN
1.0000 [drp] | Freq: Four times a day (QID) | OPHTHALMIC | Status: DC
  Administered 2016-02-10 – 2016-02-11 (×4): 1 [drp] via OPHTHALMIC
  Filled 2016-02-10 (×2): qty 3

## 2016-02-10 MED ORDER — KETOROLAC TROMETHAMINE 15 MG/ML IJ SOLN: 15.0000 mg | Freq: Four times a day (QID) | INTRAMUSCULAR | Status: DC | PRN

## 2016-02-10 MED ORDER — DIFLUPREDNATE 0.05 % OP EMUL
1.0000 [drp] | Freq: Four times a day (QID) | OPHTHALMIC | Status: DC
  Administered 2016-02-10 – 2016-02-12 (×5): 1 [drp] via OPHTHALMIC
  Filled 2016-02-10: qty 5

## 2016-02-10 MED ORDER — HYDRALAZINE HCL 20 MG/ML IJ SOLN
10.0000 mg | Freq: Four times a day (QID) | INTRAMUSCULAR | Status: DC | PRN
Start: 2016-02-10 — End: 2016-02-13

## 2016-02-10 MED ORDER — PROPRANOLOL HCL ER 60 MG PO CP24
60.0000 mg | ORAL_CAPSULE | Freq: Every day | ORAL | Status: DC
  Administered 2016-02-11 – 2016-02-13 (×2): 60 mg via ORAL
  Filled 2016-02-10 (×3): qty 1

## 2016-02-10 MED ORDER — ACETAMINOPHEN 650 MG RE SUPP: 650.0000 mg | Freq: Four times a day (QID) | RECTAL | Status: DC | PRN

## 2016-02-10 MED ORDER — DOCUSATE SODIUM 100 MG PO CAPS
100.0000 mg | ORAL_CAPSULE | Freq: Two times a day (BID) | ORAL | Status: DC
  Administered 2016-02-10: 100 mg via ORAL
  Filled 2016-02-10: qty 1

## 2016-02-10 MED ORDER — MOXIFLOXACIN HCL 0.5 % OP SOLN
1.0000 [drp] | Freq: Four times a day (QID) | OPHTHALMIC | Status: DC
  Filled 2016-02-10: qty 3

## 2016-02-10 MED ORDER — CLINDAMYCIN PHOSPHATE 600 MG/50ML IV SOLN
600.0000 mg | INTRAVENOUS | Status: DC
  Filled 2016-02-10: qty 50

## 2016-02-10 NOTE — ED Notes (Signed)
Pt  Was transported to 142.  Floor notified

## 2016-02-10 NOTE — Progress Notes (Signed)
Patient states that she is not in pain but would like to take it so she can sleep. Education given to patient on pain administration. Patient also states that she does not take any sleep aids. Family at bedside. Instructed to notify staff if in pain. No acute distress noted. Resting between care.

## 2016-02-10 NOTE — ED Provider Notes (Signed)
[facility] Emergency Department Provider Note  ______________________________________   First MD Initiated Contact with Patient 02/10/16 1119     (approximate)  I have reviewed the triage vital signs and the nursing notes.   HISTORY  Chief Complaint Fall   HPI:  Patient is an 82 year old female with a h/o AFib on coumadin, br ca s/p L mastectomy, who presents to the ER via EMS after she fell in her kitchen yesterday around 4 PM and scooted herself to the bedroom with her leg on a frying pain and she was unable to stand up. She could not get to her phone until this morning to call for help. She was wedged up against her dresser all night with her left shoulder up against it and now also has left shoulder pain. She has had dizziness for a long time for which she has been evaluated by her primary care physician. This has been exacerbated somewhat by her recent cataract surgery, last one done one week ago. She states she was vomiting all night because of the pain.  She rates pain at 10 out of 10 and it is worse with movement.  Prior to this episode she had not had any recent illness and had been feeling general well; she been eating and drinking normally but has not eaten since the fall she was unable to get up.   Past Medical History:  Diagnosis Date  . Arthritis   . Cancer (HCC)    BREAST  . Cough    CHRONIC  . Depression   . Dizziness   . DVT (deep venous thrombosis) ([place])   . Edema    FEET/LEGS  . GERD (gastroesophageal reflux disease)   . Heart murmur   . Heart palpitations   . Hepatitis   . HOH (hard of hearing)   . Hyperlipidemia   . Hypertension   . Osteoporosis   . Shortness of breath dyspnea   . Tremors of nervous system     Patient Active Problem List   Diagnosis Date Noted  . Closed left hip fracture ([place]) 02/10/2016    Past Surgical History:  Procedure Laterality Date  . APPENDECTOMY    . BREAST SURGERY     LUMPECTOMY  .  CATARACT EXTRACTION W/PHACO Right 12/18/2015   Procedure: CATARACT EXTRACTION PHACO AND INTRAOCULAR LENS PLACEMENT (IOC);  Surgeon: Eulogio Bear, MD;  Location: [facility] ORS;  Service: Ophthalmology;  Laterality: Right;  [place] 2.00 AP% 14.5 CDE 17.61 Fluid Pack lot # [id number]  . CATARACT EXTRACTION W/PHACO Left 02/05/2016   Procedure: CATARACT EXTRACTION PHACO AND INTRAOCULAR LENS PLACEMENT (IOC);  Surgeon: Eulogio Bear, MD;  Location: [facility] ORS;  Service: Ophthalmology;  Laterality: Left;  [place] 01:23 AP% 8.1 CDE 6.83 Fluid  pack lot # [id number]  . CHOLECYSTECTOMY    . EXTERNAL EAR SURGERY    . EYE SURGERY    . HERNIA REPAIR    . IVC FILTER PLACEMENT ([facility] HX)    . KNEE ARTHROSCOPY    . MASTECTOMY Left   . NOSE SURGERY     CANCER  . TONSILLECTOMY    . TUBAL LIGATION       Prior to Admission medications   Medication Sig Start Date End Date Taking? Authorizing Provider  acetaminophen (TYLENOL) 500 MG tablet Take 500 mg by mouth every 6 (six) hours as needed for mild pain.   Yes Historical Provider, MD  Difluprednate (DUREZOL) 0.05 % EMUL Apply 1 drop to eye 4 (  four) times daily. LEFT eye   Yes Historical Provider, MD  moxifloxacin (VIGAMOX) 0.5 % ophthalmic solution Place 1 drop into the left eye 4 (four) times daily.   Yes Historical Provider, MD  propranolol ER (INDERAL LA) 60 MG 24 hr capsule Take 60 mg by mouth daily.   Yes Historical Provider, MD  warfarin (COUMADIN) 3 MG tablet Take 1 tablet by mouth daily. 10/06/15  Yes Historical Provider, MD    Allergies Morphine and related and Penicillins  Family History  Problem Relation Age of Onset  . CAD Mother   . Stroke Father     Social History Social History  Substance Use Topics  . Smoking status: Never Smoker  . Smokeless tobacco: Never Used  . Alcohol use Yes     Comment: OCC    Review of Systems Constitutional: No fever/chills Eyes:vision doing well after cataract surgery ENT: No sore throat. Cardiovascular:  Denies chest pain. Respiratory: Denies shortness of breath. Gastrointestinal: No abdominal pain. See hpi Genitourinary: Negative for dysuria. Musculoskeletal: Negative for back pain. Skin: Negative for rash. Neurological:frequent HA & dizziness, not worse since last w/u for this 10-point ROS otherwise negative.  ____________________________________________   PHYSICAL EXAM:  VITAL SIGNS: ED Triage Vitals  Enc Vitals Group     BP 02/10/16 1041 (!) 156/80     Pulse Rate 02/10/16 1041 86     Resp 02/10/16 1041 (!) 24     Temp 02/10/16 1041 97.9 F (36.6 C)     Temp Source 02/10/16 1041 Oral     SpO2 02/10/16 1041 96 %     Weight 02/10/16 1046 143 lb (64.9 kg)     Height 02/10/16 1046 5\' 2"  (1.575 m)     Head Circumference --      [place] --      Pain Score 02/10/16 1046 10     Pain Loc --      Pain Edu? --      Excl. in [place]? --    Constitutional: Alert and oriented. Well appearing and in no acute distress. Eyes: Conjunctivae are normal. PERRL. EOMI. Head: Atraumatic. Nose: No congestion/rhinnorhea. Mouth/Throat: Mucous membranes are dry. ropharynx non-erythematous. Neck: No stridor.   Cardiovascular: Normal rate, regular rhythm. Grossly normal heart sounds.  Good peripheral circulation. Respiratory: Normal respiratory effort.  No retractions. Lungs CTAB. Gastrointestinal: Soft and nontender. No distention. No abdominal bruits. No CVA tenderness. Musculoskeletal: LLE shortened & externally rotated; dp & pt pulses 2+ B; bruising over ankle;  L shoulder with mild erythema over lateral & posterior aspect c/w pressure Neurologic:  Normal speech and language. No gross focal neurologic deficits are appreciated. Unable to stand Skin:  Skin is warm, dry and intact. No rash noted.  Psychiatric: Mood and affect are normal. Speech and behavior are normal.  ____________________________________________   LABS (all labs ordered are listed, but only abnormal results are  displayed)  Labs Reviewed  BASIC METABOLIC PANEL - Abnormal; Notable for the following:       Result Value   Sodium 134 (*)    Glucose, Bld 169 (*)    BUN 26 (*)    All other components within normal limits  CK - Abnormal; Notable for the following:    Total CK 1,794 (*)    All other components within normal limits  PROTIME-INR - Abnormal; Notable for the following:    Prothrombin Time 17.3 (*)    All other components within normal limits  CBC WITH DIFFERENTIAL/PLATELET - Abnormal;  Notable for the following:    WBC 25.8 (*)    RDW 15.4 (*)    Neutro Abs 22.4 (*)    Lymphs Abs 0.8 (*)    Monocytes Absolute 2.6 (*)    All other components within normal limits  URINALYSIS COMPLETEWITH MICROSCOPIC ([facility] ONLY) - Abnormal; Notable for the following:    Color, Urine AMBER (*)    APPearance CLEAR (*)    Ketones, ur TRACE (*)    Hgb urine dipstick 2+ (*)    Protein, ur 30 (*)    Bacteria, UA RARE (*)    Squamous Epithelial / LPF 0-5 (*)    All other components within normal limits  SURGICAL PCR SCREEN  URINE CULTURE  CBC WITH DIFFERENTIAL/PLATELET  BASIC METABOLIC PANEL  CBC  CK  PROTIME-INR   ____________________________________________  EKG  ED ECG REPORT I, Ponciano Ort, the attending physician, personally viewed and interpreted this ECG.   Date: 02/10/2016  EKG Time:1057  Rate:87  Rhythm:nsr  Axis:nl  Intervals:none  ST&T Change: none  ____________________________________________  RADIOLOGY  Ct head-1. No acute intracranial pathology. 2. Chronic microvascular disease and cerebral atrophy.  ____________________________________________   PROCEDURES  Procedure(s) performed: None  Procedures  Critical Care performed: No  ____________________________________________    INITIAL IMPRESSION / ASSESSMENT AND PLAN / ED COURSE  Pertinent labs & imaging results that were available during my care of the patient were reviewed by me and considered in my  medical decision making (see chart for details).   Clinical Course   ----------------------------------------- 12:49 PM on 02/10/2016 -----------------------------------------  I discussed with Dr. Reva Bores, prime doc, who will admit.  I updated pt & her daughter.  Pt still notes discomfort; will give a second dose of fentanyl.  Will give IVF for rhabdomyolysis.    ____________________________________________   FINAL CLINICAL IMPRESSION(S) / ED DIAGNOSES  Final diagnoses:  Non-traumatic rhabdomyolysis  Hip fracture requiring operative repair, left, closed, initial encounter ([place])      NEW MEDICATIONS STARTED DURING THIS VISIT:  Current Discharge Medication List       Note:  This document was prepared using Dragon voice recognition software and may include unintentional dictation errors.    Ponciano Ort, MD 02/10/16 2124

## 2016-02-10 NOTE — Progress Notes (Signed)
Patient was admitted to run 145 from ER via cart and NT. NSL in place to RW. Slide patient up with assist x3. Applies bucks traction and inserted foley. Daughter at bedside, reviewed POC.

## 2016-02-10 NOTE — NC FL2 (Signed)
[place] LEVEL OF CARE SCREENING TOOL     IDENTIFICATION  Patient Name: [name] Birthdate: Jan 10, 1925 Sex: female Admission Date (Current Location): 02/10/2016  [place] and [place] Number:  Engineering geologist and Address:  [facility], [address] [place], [place] [address]      Provider Number: [id number]  Attending Physician Name and Address:  Hillary Bow, MD  Relative Name and Phone Number:       Current Level of Care: Hospital Recommended Level of Care: [place] Prior Approval Number:    Date Approved/Denied:   PASRR Number:  ([id number])  Discharge Plan: SNF    Current Diagnoses: Patient Active Problem List   Diagnosis Date Noted  . Closed left hip fracture ([place]) 02/10/2016    Orientation RESPIRATION BLADDER Height & Weight     Self, Time, Situation, Place  Normal Continent Weight: 145 lb (65.8 kg) Height:  5\' 7"  (170.2 cm)  BEHAVIORAL SYMPTOMS/MOOD NEUROLOGICAL BOWEL NUTRITION STATUS   (none)  (none) Continent Diet (NPO for surgery )  AMBULATORY STATUS COMMUNICATION OF NEEDS Skin   Extensive Assist Verbally Surgical wounds                       Personal Care Assistance Level of Assistance  Bathing, Feeding, Dressing Bathing Assistance: Limited assistance Feeding assistance: Independent Dressing Assistance: Limited assistance     Functional Limitations Info  Sight, Hearing, Speech Sight Info: Adequate Hearing Info: Adequate Speech Info: Adequate    SPECIAL CARE FACTORS FREQUENCY  PT (By licensed PT), OT (By licensed OT)     PT Frequency:  (5) OT Frequency:  (5)            Contractures      Additional Factors Info  Code Status, Allergies Code Status Info:  (Full Code. ) Allergies Info:  (Morphine And Related, Penicillins)           Current Medications (02/10/2016):  This is the current hospital active medication list Current Facility-Administered  Medications  Medication Dose Route Frequency Provider Last Rate Last Dose  . 0.9 %  sodium chloride infusion   Intravenous Continuous Srikar Sudini, MD      . acetaminophen (TYLENOL) tablet 650 mg  650 mg Oral Q6H PRN Hillary Bow, MD       Or  . acetaminophen (TYLENOL) suppository 650 mg  650 mg Rectal Q6H PRN Srikar Sudini, MD      . albuterol (PROVENTIL) (2.5 MG/3ML) 0.083% nebulizer solution 2.5 mg  2.5 mg Nebulization Q2H PRN Srikar Sudini, MD      . bisacodyl (DULCOLAX) suppository 10 mg  10 mg Rectal Daily PRN Srikar Sudini, MD      . clindamycin (CLEOCIN) IVPB 600 mg  600 mg Intravenous To OR Dereck Leep, MD      . docusate sodium (COLACE) capsule 100 mg  100 mg Oral BID Srikar Sudini, MD      . hydrALAZINE (APRESOLINE) injection 10 mg  10 mg Intravenous Q6H PRN Srikar Sudini, MD      . ondansetron (ZOFRAN) tablet 4 mg  4 mg Oral Q6H PRN Srikar Sudini, MD       Or  . ondansetron (ZOFRAN) injection 4 mg  4 mg Intravenous Q6H PRN Srikar Sudini, MD      . oxyCODONE (Oxy IR/ROXICODONE) immediate release tablet 5 mg  5 mg Oral Q4H PRN Srikar Sudini, MD      . polyethylene glycol (  MIRALAX / GLYCOLAX) packet 17 g  17 g Oral Daily PRN Hillary Bow, MD      . Derrill Memo ON 02/11/2016] propranolol ER (INDERAL LA) 24 hr capsule 60 mg  60 mg Oral Daily Srikar Sudini, MD      . senna (SENOKOT) tablet 8.6 mg  1 tablet Oral BID Hillary Bow, MD         Discharge Medications: Please see discharge summary for a list of discharge medications.  Relevant Imaging Results:  Relevant Lab Results:   Additional Information  (SSN: [id number])  Dream Nodal, Veronia Beets, LCSW

## 2016-02-10 NOTE — Consult Note (Signed)
ORTHOPAEDIC CONSULTATION  PATIENT NAME: [name] DOB: [birth year]  MRN: [id number]  REQUESTING PHYSICIAN: Hillary Bow, MD  Chief Complaint: Left hip pain  HPI: [name] is a 82 y.o. female who complains of  left hip pain. The patient apparently turned suddenly yesterday and lost her balance, falling to her left hip and side. She was unable stand or bear weight due to the pain. She denied any loss of consciousness. She initially had some left shoulder pain but states that the shoulder pain has improved. Radiographs obtained at [facility] emergency room demonstrated a left intertrochanteric femur fracture. Prior to the injury she was using a wheeled walker for ambulation.  Past Medical History:  Diagnosis Date  . Arthritis   . Cancer (HCC)    BREAST  . Cough    CHRONIC  . Depression   . Dizziness   . DVT (deep venous thrombosis) ([place])   . Edema    FEET/LEGS  . GERD (gastroesophageal reflux disease)   . Heart murmur   . Heart palpitations   . Hepatitis   . HOH (hard of hearing)   . Hyperlipidemia   . Hypertension   . Osteoporosis   . Shortness of breath dyspnea   . Tremors of nervous system    Past Surgical History:  Procedure Laterality Date  . APPENDECTOMY    . BREAST SURGERY     LUMPECTOMY  . CATARACT EXTRACTION W/PHACO Right 12/18/2015   Procedure: CATARACT EXTRACTION PHACO AND INTRAOCULAR LENS PLACEMENT (IOC);  Surgeon: Eulogio Bear, MD;  Location: [facility] ORS;  Service: Ophthalmology;  Laterality: Right;  [place] 2.00 AP% 14.5 CDE 17.61 Fluid Pack lot # [id number]  . CATARACT EXTRACTION W/PHACO Left 02/05/2016   Procedure: CATARACT EXTRACTION PHACO AND INTRAOCULAR LENS PLACEMENT (IOC);  Surgeon: Eulogio Bear, MD;  Location: [facility] ORS;  Service: Ophthalmology;  Laterality: Left;  [place] 01:23 AP% 8.1 CDE 6.83 Fluid  pack lot # [id number]  . CHOLECYSTECTOMY    . EXTERNAL EAR SURGERY    . EYE SURGERY    . HERNIA REPAIR    . IVC  FILTER PLACEMENT ([facility] HX)    . KNEE ARTHROSCOPY    . MASTECTOMY Left   . NOSE SURGERY     CANCER  . TONSILLECTOMY    . TUBAL LIGATION     Social History   Social History  . Marital status: Widowed    Spouse name: N/A  . Number of children: N/A  . Years of education: N/A   Social History Main Topics  . Smoking status: Never Smoker  . Smokeless tobacco: Never Used  . Alcohol use Yes     Comment: OCC  . Drug use: Unknown  . Sexual activity: No   Other Topics Concern  . None   Social History Narrative  . None   Family History  Problem Relation Age of Onset  . CAD Mother   . Stroke Father    Allergies  Allergen Reactions  . Morphine And Related Nausea And Vomiting  . Penicillins Other (See Comments)    Unknown Has patient had a PCN reaction causing immediate rash, facial/tongue/throat swelling, SOB or lightheadedness with hypotension: unsure Has patient had a PCN reaction causing severe rash involving mucus membranes or skin necrosis: unsure Has patient had a PCN reaction that required hospitalization unsure Has patient had a PCN reaction occurring within the last 10 years: no If all of the above answers are "NO", then may proceed with  Cephalosporin use.   Prior to Admission medications   Medication Sig Start Date End Date Taking? Authorizing Provider  acetaminophen (TYLENOL) 500 MG tablet Take 500 mg by mouth every 6 (six) hours as needed for mild pain.   Yes Historical Provider, MD  Difluprednate (DUREZOL) 0.05 % EMUL Apply 1 drop to eye 4 (four) times daily. LEFT eye   Yes Historical Provider, MD  moxifloxacin (VIGAMOX) 0.5 % ophthalmic solution Place 1 drop into the left eye 4 (four) times daily.   Yes Historical Provider, MD  propranolol ER (INDERAL LA) 60 MG 24 hr capsule Take 60 mg by mouth daily.   Yes Historical Provider, MD  warfarin (COUMADIN) 3 MG tablet Take 1 tablet by mouth daily. 10/06/15  Yes Historical Provider, MD   Dg Chest 1 View  Result Date:  02/10/2016 CLINICAL DATA:  [place] yesterday with shoulder and hip pain EXAM: CHEST 1 VIEW COMPARISON:  CT chest of 09/10/2014 and chest x-ray of 10/19/2011 FINDINGS: There is linear opacity in the right mid lung. This opacity likely is chronic although atelectasis cannot be excluded. Vague nodular opacities are present overlying the left anterior second and third ribs possibly related to small lung nodules noted on prior CT of the chest consistent with a chronic indolent infection. No pneumonia or effusion is currently seen. The heart is within normal limits in size. Surgical clips overlie the left axilla. The bones are osteopenic. IMPRESSION: 1. No definite active process. 2. Linear atelectasis or scarring the right mid lung. 3. Vague nodular opacities overlie the left anterior second and third ribs possibly representing small nodules noted on prior CT of the chest. Electronically Signed   By: Ivar Drape M.D.   On: 02/10/2016 12:48   Ct Head Wo Contrast  Result Date: 02/10/2016 CLINICAL DATA:  Status post fall.  Left hip and left leg pain. EXAM: CT HEAD WITHOUT CONTRAST TECHNIQUE: Contiguous axial images were obtained from the base of the skull through the vertex without intravenous contrast. COMPARISON:  MR brain 09/12/2015 FINDINGS: Brain: No evidence of acute infarction, hemorrhage, extra-axial collection, ventriculomegaly, or mass effect. Generalized cerebral atrophy. Periventricular white matter low attenuation likely secondary to microangiopathy. Vascular: Cerebrovascular atherosclerotic calcifications are noted. Skull: Negative for fracture or focal lesion. Sinuses/Orbits: Visualized portions of the orbits are unremarkable. Visualized portions of the paranasal sinuses and mastoid air cells are unremarkable. Other: None. IMPRESSION: 1. No acute intracranial pathology. 2. Chronic microvascular disease and cerebral atrophy. Electronically Signed   By: Kathreen Devoid   On: 02/10/2016 12:06   Dg Shoulder  Left  Result Date: 02/10/2016 CLINICAL DATA:  Recent fall with left shoulder pain EXAM: LEFT SHOULDER - 2+ VIEW COMPARISON:  CT chest of 09/10/2014 FINDINGS: The bones are diffusely osteopenic. The left humeral head is in normal position with only mild degenerative joint disease for age. No fracture is seen. The left [facility] joint is normally aligned. The ribs that are visualized appear intact. Subtle nodularity in the left mid upper lung field may be due to chronic indolent infection as described on prior CT of the chest. IMPRESSION: Osteopenia.  No acute fracture. Electronically Signed   By: Ivar Drape M.D.   On: 02/10/2016 12:51   Dg Hip Unilat With Pelvis 2-3 Views Left  Result Date: 02/10/2016 CLINICAL DATA:  Status post fall.  Left hip and shoulder pain. EXAM: DG HIP (WITH OR WITHOUT PELVIS) 2-3V LEFT COMPARISON:  None. FINDINGS: Comminuted left intertrochanteric fracture with mild displacement. No right hip fracture.  No dislocation. Mild osteoarthritis of bilateral hips. Mild osteoarthritis of bilateral sacroiliac joints. IMPRESSION: Acute, comminuted left intertrochanteric fracture. Electronically Signed   By: Kathreen Devoid   On: 02/10/2016 12:45    Positive ROS: All other systems have been reviewed and were otherwise negative with the exception of those mentioned in the HPI and as above.  Physical Exam: General: Alert and alert in no acute distress. HEENT: Atraumatic and normocephalic. Sclera are clear. Extraocular motion is intact. Oropharynx is clear with moist mucosa. Neck: Supple, nontender, good range of motion. No JVD or carotid bruits. Lungs: Clear to auscultation bilaterally. Cardiovascular: Regular rate and rhythm with normal S1 and S2. No murmurs. No gallops or rubs. Pedal pulses are palpable bilaterally. Homans test is negative bilaterally. No significant pretibial or ankle edema. Abdomen: Soft, nontender, and nondistended. Bowel sounds are present. Skin: Early ecchymosis to the left  shoulder. Neurologic: Awake, alert, and oriented. Sensory function is grossly intact. Motor strength is felt to be 5 over 5 bilaterally. No clonus or tremor. Good motor coordination. Lymphatic: No axillary or cervical lymphadenopathy  MUSCULOSKELETAL:  Examination of the left shoulder shows some early ecchymosis but no significant swelling. No gross tenderness to palpation about the before meals joint or about the humeral head. Gentle range of motion of the left shoulder is well-tolerated.  Examination of the lower extremities is notable for the left hip. The left lower extremity is shortened and slightly rotated. Pain is elicited with any attempt at active or passive motion of the left hip. No tenderness to palpation about the left knee. No knee swelling.  Assessment: Left intertrochanteric femur fracture.  Plan: The findings were discussed in detail with the patient and her family. I recommended open reduction and internal fixation of the left intertrochanteric femur fracture. The usual perioperative course was discussed. The risks and benefits of surgical intervention were reviewed. The patient expressed understanding of the risks and benefits and agreed with plans for surgical intervention.  The surgical site was signed as per the "right site surgery" protocol.   Repeat prothrombin time will be obtained in the morning.   Eliceo Gladu P. Holley Bouche M.D.

## 2016-02-10 NOTE — ED Notes (Signed)
Patient transported to CT 

## 2016-02-10 NOTE — ED Triage Notes (Signed)
Pt states she fell yesterday at 4 pm when she turned around too fast. Pt was found this morning by a neighbor. Pt states her left hip and L leg hurt. Pt states she didn't hit her head. Pt alert and oriented. {t has bruising to L shoulder, L arm and L leg.

## 2016-02-10 NOTE — H&P (Signed)
[place] [facility] [place] NAME: [name]    MR#:  [id number]  DATE OF BIRTH:  11-19-1924  DATE OF ADMISSION:  02/10/2016  PRIMARY CARE PHYSICIAN: SPARKS,JEFFREY D, MD   REQUESTING/REFERRING PHYSICIAN: Dr. Robet Leu  CHIEF COMPLAINT:   Chief Complaint  Patient presents with  . Fall    HISTORY OF PRESENT ILLNESS:  [name]  is a 82 y.o. female with a known history of Cataracts, chronic dizziness presents to the emergency room after she tried to turn quickly in her kitchen and fell onto the floor landing on the left side and hitting her head. Did not lose consciousness. Here in the emergency room patient has been found to have left hip fracture and is being admitted for surgery. No cardiac history. No pulmonary issues. Patient has been on Coumadin but her INR level today is 1.4.  PAST MEDICAL HISTORY:   Past Medical History:  Diagnosis Date  . Arthritis   . Cancer (HCC)    BREAST  . Cough    CHRONIC  . Depression   . Dizziness   . Edema    FEET/LEGS  . GERD (gastroesophageal reflux disease)   . Heart murmur   . Heart palpitations   . Hepatitis   . HOH (hard of hearing)   . Shortness of breath dyspnea   . Tremors of nervous system     PAST SURGICAL HISTORY:   Past Surgical History:  Procedure Laterality Date  . APPENDECTOMY    . BREAST SURGERY     LUMPECTOMY  . CATARACT EXTRACTION W/PHACO Right 12/18/2015   Procedure: CATARACT EXTRACTION PHACO AND INTRAOCULAR LENS PLACEMENT (IOC);  Surgeon: Eulogio Bear, MD;  Location: [facility] ORS;  Service: Ophthalmology;  Laterality: Right;  [place] 2.00 AP% 14.5 CDE 17.61 Fluid Pack lot # [id number]  . CATARACT EXTRACTION W/PHACO Left 02/05/2016   Procedure: CATARACT EXTRACTION PHACO AND INTRAOCULAR LENS PLACEMENT (IOC);  Surgeon: Eulogio Bear, MD;  Location: [facility] ORS;  Service: Ophthalmology;  Laterality: Left;  [place] 01:23 AP% 8.1 CDE 6.83 Fluid  pack lot # [id number]   . CHOLECYSTECTOMY    . EXTERNAL EAR SURGERY    . EYE SURGERY    . HERNIA REPAIR    . IVC FILTER PLACEMENT ([facility] HX)    . KNEE ARTHROSCOPY    . MASTECTOMY Left   . NOSE SURGERY     CANCER  . TONSILLECTOMY    . TUBAL LIGATION      SOCIAL HISTORY:   Social History  Substance Use Topics  . Smoking status: Never Smoker  . Smokeless tobacco: Never Used  . Alcohol use Yes     Comment: OCC    FAMILY HISTORY:   Family History  Problem Relation Age of Onset  . CAD Mother   . Stroke Father     DRUG ALLERGIES:   Allergies  Allergen Reactions  . Morphine And Related Nausea And Vomiting  . Penicillins Other (See Comments)    Unknown Has patient had a PCN reaction causing immediate rash, facial/tongue/throat swelling, SOB or lightheadedness with hypotension: unsure Has patient had a PCN reaction causing severe rash involving mucus membranes or skin necrosis: unsure Has patient had a PCN reaction that required hospitalization unsure Has patient had a PCN reaction occurring within the last 10 years: no If all of the above answers are "NO", then may proceed with Cephalosporin use.    REVIEW OF SYSTEMS:   Review of Systems  Constitutional: Negative for chills and fever.  HENT: Negative for sore throat.   Eyes: Negative for blurred vision, double vision and pain.  Respiratory: Negative for cough, hemoptysis, shortness of breath and wheezing.   Cardiovascular: Negative for chest pain, palpitations, orthopnea and leg swelling.  Gastrointestinal: Negative for abdominal pain, constipation, diarrhea, heartburn, nausea and vomiting.  Genitourinary: Negative for dysuria and hematuria.  Musculoskeletal: Positive for falls and joint pain. Negative for back pain.  Skin: Negative for rash.  Neurological: Positive for dizziness. Negative for sensory change, speech change, focal weakness and headaches.  Endo/Heme/Allergies: Does not bruise/bleed easily.  Psychiatric/Behavioral: Negative  for depression. The patient is not nervous/anxious.     MEDICATIONS AT HOME:   Prior to Admission medications   Medication Sig Start Date End Date Taking? Authorizing Provider  acetaminophen (TYLENOL) 500 MG tablet Take 500 mg by mouth every 6 (six) hours as needed for mild pain.    Historical Provider, MD  propranolol ER (INDERAL LA) 60 MG 24 hr capsule Take 60 mg by mouth daily.    Historical Provider, MD  warfarin (COUMADIN) 3 MG tablet Take 1 tablet by mouth daily. 10/06/15   Historical Provider, MD     VITAL SIGNS:  Blood pressure (!) 159/85, pulse 86, temperature 97.9 F (36.6 C), temperature source Oral, resp. rate 19, height 5\' 2"  (1.575 m), weight 64.9 kg (143 lb), SpO2 97 %.  PHYSICAL EXAMINATION:  Physical Exam  GENERAL:  82 y.o.-year-old patient lying in the bed with no acute distress.  EYES: Pupils equal, round, reactive to light and accommodation. No scleral icterus. Extraocular muscles intact.  HEENT: Head atraumatic, normocephalic. Oropharynx and nasopharynx clear. No oropharyngeal erythema, moist oral mucosa  NECK:  Supple, no jugular venous distention. No thyroid enlargement, no tenderness.  LUNGS: Normal breath sounds bilaterally, no wheezing, rales, rhonchi. No use of accessory muscles of respiration.  CARDIOVASCULAR: S1, S2 normal. No murmurs, rubs, or gallops.  ABDOMEN: Soft, nontender, nondistended. Bowel sounds present. No organomegaly or mass.  EXTREMITIES: No pedal edema, cyanosis, or clubbing. + 2 pedal & radial pulses b/l.  Left hip tenderness. No swelling. NEUROLOGIC: Cranial nerves II through XII are intact. No focal Motor or sensory deficits appreciated b/l PSYCHIATRIC: The patient is alert and oriented x 3. Good affect.  SKIN: No obvious rash, lesion, or ulcer.   LABORATORY PANEL:   CBC  Recent Labs Lab 02/10/16 1251  WBC 25.8*  HGB 14.0  HCT 41.5  PLT 368    ------------------------------------------------------------------------------------------------------------------  Chemistries   Recent Labs Lab 02/10/16 1037  NA 134*  K 4.3  CL 101  CO2 24  GLUCOSE 169*  BUN 26*  CREATININE 0.67  CALCIUM 9.3   ------------------------------------------------------------------------------------------------------------------  Cardiac Enzymes No results for input(s): TROPONINI in the last 168 hours. ------------------------------------------------------------------------------------------------------------------  RADIOLOGY:  Dg Chest 1 View  Result Date: 02/10/2016 CLINICAL DATA:  [place] yesterday with shoulder and hip pain EXAM: CHEST 1 VIEW COMPARISON:  CT chest of 09/10/2014 and chest x-ray of 10/19/2011 FINDINGS: There is linear opacity in the right mid lung. This opacity likely is chronic although atelectasis cannot be excluded. Vague nodular opacities are present overlying the left anterior second and third ribs possibly related to small lung nodules noted on prior CT of the chest consistent with a chronic indolent infection. No pneumonia or effusion is currently seen. The heart is within normal limits in size. Surgical clips overlie the left axilla. The bones are osteopenic. IMPRESSION: 1. No definite active process. 2. Linear  atelectasis or scarring the right mid lung. 3. Vague nodular opacities overlie the left anterior second and third ribs possibly representing small nodules noted on prior CT of the chest. Electronically Signed   By: Ivar Drape M.D.   On: 02/10/2016 12:48   Ct Head Wo Contrast  Result Date: 02/10/2016 CLINICAL DATA:  Status post fall.  Left hip and left leg pain. EXAM: CT HEAD WITHOUT CONTRAST TECHNIQUE: Contiguous axial images were obtained from the base of the skull through the vertex without intravenous contrast. COMPARISON:  MR brain 09/12/2015 FINDINGS: Brain: No evidence of acute infarction, hemorrhage, extra-axial  collection, ventriculomegaly, or mass effect. Generalized cerebral atrophy. Periventricular white matter low attenuation likely secondary to microangiopathy. Vascular: Cerebrovascular atherosclerotic calcifications are noted. Skull: Negative for fracture or focal lesion. Sinuses/Orbits: Visualized portions of the orbits are unremarkable. Visualized portions of the paranasal sinuses and mastoid air cells are unremarkable. Other: None. IMPRESSION: 1. No acute intracranial pathology. 2. Chronic microvascular disease and cerebral atrophy. Electronically Signed   By: Kathreen Devoid   On: 02/10/2016 12:06   Dg Shoulder Left  Result Date: 02/10/2016 CLINICAL DATA:  Recent fall with left shoulder pain EXAM: LEFT SHOULDER - 2+ VIEW COMPARISON:  CT chest of 09/10/2014 FINDINGS: The bones are diffusely osteopenic. The left humeral head is in normal position with only mild degenerative joint disease for age. No fracture is seen. The left [facility] joint is normally aligned. The ribs that are visualized appear intact. Subtle nodularity in the left mid upper lung field may be due to chronic indolent infection as described on prior CT of the chest. IMPRESSION: Osteopenia.  No acute fracture. Electronically Signed   By: Ivar Drape M.D.   On: 02/10/2016 12:51   Dg Hip Unilat With Pelvis 2-3 Views Left  Result Date: 02/10/2016 CLINICAL DATA:  Status post fall.  Left hip and shoulder pain. EXAM: DG HIP (WITH OR WITHOUT PELVIS) 2-3V LEFT COMPARISON:  None. FINDINGS: Comminuted left intertrochanteric fracture with mild displacement. No right hip fracture. No dislocation. Mild osteoarthritis of bilateral hips. Mild osteoarthritis of bilateral sacroiliac joints. IMPRESSION: Acute, comminuted left intertrochanteric fracture. Electronically Signed   By: Kathreen Devoid   On: 02/10/2016 12:45     IMPRESSION AND PLAN:   * Left hip fracture Patient will be admitted to orthopedic floor. Patient will be low risk for surgery. INR is 1.4. Can  proceed with surgery Patient will be placed on bedrest. Pain medications added. Orthopedics consulted. DVT prophylaxis as per orthopedics. SCDs added.  * Acute rhabdomyolysis Did fall and immobility since yesterday evening. Start IV fluids. Repeat labs in the morning. No acute kidney injury.  * Leukocytosis Could be due to hemoconcentration and stress reaction. We'll check a urinalysis. No antibiotics. Afebrile.  All the records are reviewed and case discussed with ED provider. Management plans discussed with the patient, family and they are in agreement.  CODE STATUS: FULL CODE  TOTAL TIME TAKING CARE OF THIS PATIENT: 40 minutes.   Hillary Bow R M.D on 02/10/2016 at 1:28 PM  Between 7am to 6pm - Pager - [phone]  After 6pm go to [email or web address] - password [email or web address] [facility]  Office  [phone]  CC: Primary care physician; Idelle Crouch, MD  Note: This dictation was prepared with Dragon dictation along with smaller phrase technology. Any transcriptional errors that result from this process are unintentional.

## 2016-02-11 ENCOUNTER — Inpatient Hospital Stay: Payer: Medicare Other | Admitting: Anesthesiology

## 2016-02-11 ENCOUNTER — Inpatient Hospital Stay: Payer: Medicare Other

## 2016-02-11 ENCOUNTER — Encounter
Admission: EM | Disposition: A | Discharge status: Skilled Nursing Facility | Payer: Self-pay | Source: Home / Self Care | Attending: Internal Medicine

## 2016-02-11 HISTORY — PX: INTRAMEDULLARY (IM) NAIL INTERTROCHANTERIC: SHX5875

## 2016-02-11 LAB — CK: Total CK: 771 U/L — ABNORMAL HIGH (ref 38–234)

## 2016-02-11 LAB — PROTIME-INR
INR: 1.45
PROTHROMBIN TIME: 17.8 s — AB (ref 11.4–15.2)

## 2016-02-11 LAB — CBC
HEMATOCRIT: 35.3 % (ref 35.0–47.0)
HEMOGLOBIN: 11.8 g/dL — AB (ref 12.0–16.0)
MCH: 28.1 pg (ref 26.0–34.0)
MCHC: 33.5 g/dL (ref 32.0–36.0)
MCV: 84 fL (ref 80.0–100.0)
Platelets: 315 10*3/uL (ref 150–440)
RBC: 4.21 MIL/uL (ref 3.80–5.20)
RDW: 15.7 % — ABNORMAL HIGH (ref 11.5–14.5)
WBC: 19.5 10*3/uL — AB (ref 3.6–11.0)

## 2016-02-11 LAB — BASIC METABOLIC PANEL
ANION GAP: 2 — AB (ref 5–15)
BUN: 31 mg/dL — ABNORMAL HIGH (ref 6–20)
CHLORIDE: 107 mmol/L (ref 101–111)
CO2: 28 mmol/L (ref 22–32)
Calcium: 8.6 mg/dL — ABNORMAL LOW (ref 8.9–10.3)
Creatinine, Ser: 0.8 mg/dL (ref 0.44–1.00)
GFR calc Af Amer: >60 mL/min (ref >60)
Glucose, Bld: 128 mg/dL — ABNORMAL HIGH (ref 65–99)
POTASSIUM: 3.9 mmol/L (ref 3.5–5.1)
SODIUM: 137 mmol/L (ref 135–145)

## 2016-02-11 SURGERY — FIXATION, FRACTURE, INTERTROCHANTERIC, WITH INTRAMEDULLARY ROD
Anesthesia: General | Site: Hip | Laterality: Left | Wound class: Clean

## 2016-02-11 MED ORDER — FENTANYL CITRATE (PF) 100 MCG/2ML IJ SOLN
25.0000 ug | INTRAMUSCULAR | Status: DC | PRN
  Administered 2016-02-11: 25 ug via INTRAVENOUS
  Administered 2016-02-11 (×2): 50 ug via INTRAVENOUS

## 2016-02-11 MED ORDER — SODIUM CHLORIDE 0.9 % IV SOLN
INTRAVENOUS | Status: DC | PRN
  Administered 2016-02-11: 16:00:00 via INTRAVENOUS

## 2016-02-11 MED ORDER — SUCCINYLCHOLINE CHLORIDE 20 MG/ML IJ SOLN
INTRAMUSCULAR | Status: DC | PRN
  Administered 2016-02-11: 80 mg via INTRAVENOUS

## 2016-02-11 MED ORDER — ONDANSETRON HCL 4 MG/2ML IJ SOLN
INTRAMUSCULAR | Status: DC | PRN
  Administered 2016-02-11: 4 mg via INTRAVENOUS

## 2016-02-11 MED ORDER — FLEET ENEMA 7-19 GM/118ML RE ENEM: 1.0000 | ENEMA | Freq: Once | RECTAL | Status: DC | PRN

## 2016-02-11 MED ORDER — MENTHOL 3 MG MT LOZG
1.0000 | LOZENGE | OROMUCOSAL | Status: DC | PRN
  Filled 2016-02-11: qty 9

## 2016-02-11 MED ORDER — BISACODYL 10 MG RE SUPP: 10.0000 mg | Freq: Every day | RECTAL | Status: DC | PRN

## 2016-02-11 MED ORDER — NEOMYCIN-POLYMYXIN B GU 40-200000 IR SOLN
Status: DC | PRN
  Administered 2016-02-11: 2 mL

## 2016-02-11 MED ORDER — METOCLOPRAMIDE HCL 10 MG PO TABS
5.0000 mg | ORAL_TABLET | Freq: Three times a day (TID) | ORAL | Status: DC | PRN
  Filled 2016-02-11 (×2): qty 1

## 2016-02-11 MED ORDER — OXYCODONE HCL 5 MG/5ML PO SOLN: 5.0000 mg | Freq: Once | ORAL | Status: DC | PRN

## 2016-02-11 MED ORDER — WARFARIN - PHYSICIAN DOSING INPATIENT: Freq: Every day | Status: DC

## 2016-02-11 MED ORDER — TRAMADOL HCL 50 MG PO TABS
50.0000 mg | ORAL_TABLET | ORAL | Status: DC | PRN
  Administered 2016-02-12 (×2): 50 mg via ORAL
  Filled 2016-02-11: qty 1

## 2016-02-11 MED ORDER — ACETAMINOPHEN 10 MG/ML IV SOLN
1000.0000 mg | Freq: Four times a day (QID) | INTRAVENOUS | Status: AC
  Administered 2016-02-11 – 2016-02-12 (×4): 1000 mg via INTRAVENOUS
  Filled 2016-02-11 (×4): qty 100

## 2016-02-11 MED ORDER — PANTOPRAZOLE SODIUM 40 MG PO TBEC
40.0000 mg | DELAYED_RELEASE_TABLET | Freq: Two times a day (BID) | ORAL | Status: DC
  Administered 2016-02-11 – 2016-02-13 (×4): 40 mg via ORAL
  Filled 2016-02-11 (×5): qty 1

## 2016-02-11 MED ORDER — WARFARIN SODIUM 1 MG PO TABS
3.0000 mg | ORAL_TABLET | Freq: Every day | ORAL | Status: DC
  Administered 2016-02-12: 3 mg via ORAL
  Filled 2016-02-11: qty 3

## 2016-02-11 MED ORDER — METOCLOPRAMIDE HCL 10 MG PO TABS
10.0000 mg | ORAL_TABLET | Freq: Three times a day (TID) | ORAL | Status: DC
  Administered 2016-02-11 – 2016-02-13 (×7): 10 mg via ORAL
  Filled 2016-02-11 (×6): qty 1

## 2016-02-11 MED ORDER — LIDOCAINE HCL (CARDIAC) 20 MG/ML IV SOLN
INTRAVENOUS | Status: DC | PRN
  Administered 2016-02-11: 80 mg via INTRAVENOUS

## 2016-02-11 MED ORDER — ONDANSETRON HCL 4 MG/2ML IJ SOLN: 4.0000 mg | Freq: Four times a day (QID) | INTRAMUSCULAR | Status: DC | PRN

## 2016-02-11 MED ORDER — OXYCODONE HCL 5 MG PO TABS
5.0000 mg | ORAL_TABLET | ORAL | Status: DC | PRN
  Administered 2016-02-12 (×2): 5 mg via ORAL
  Administered 2016-02-13: 10 mg via ORAL
  Administered 2016-02-13 (×2): 5 mg via ORAL
  Filled 2016-02-11 (×3): qty 1
  Filled 2016-02-11: qty 2
  Filled 2016-02-11: qty 1

## 2016-02-11 MED ORDER — PROPOFOL 10 MG/ML IV BOLUS
INTRAVENOUS | Status: DC | PRN
  Administered 2016-02-11: 120 mg via INTRAVENOUS

## 2016-02-11 MED ORDER — FENTANYL CITRATE (PF) 100 MCG/2ML IJ SOLN
INTRAMUSCULAR | Status: AC
  Filled 2016-02-11: qty 2

## 2016-02-11 MED ORDER — PHENOL 1.4 % MT LIQD
1.0000 | OROMUCOSAL | Status: DC | PRN
Start: 2016-02-11 — End: 2016-02-13
  Filled 2016-02-11: qty 177

## 2016-02-11 MED ORDER — EPHEDRINE SULFATE 50 MG/ML IJ SOLN
INTRAMUSCULAR | Status: DC | PRN
  Administered 2016-02-11 (×4): 10 mg via INTRAVENOUS

## 2016-02-11 MED ORDER — MAGNESIUM HYDROXIDE 400 MG/5ML PO SUSP
30.0000 mL | Freq: Every day | ORAL | Status: DC | PRN
  Administered 2016-02-12: 30 mL via ORAL
  Filled 2016-02-11: qty 30

## 2016-02-11 MED ORDER — ACETAMINOPHEN 10 MG/ML IV SOLN
INTRAVENOUS | Status: AC
  Administered 2016-02-11: 1000 mg
  Filled 2016-02-11: qty 100

## 2016-02-11 MED ORDER — ONDANSETRON HCL 4 MG PO TABS: 4.0000 mg | ORAL_TABLET | Freq: Four times a day (QID) | ORAL | Status: DC | PRN

## 2016-02-11 MED ORDER — NEOMYCIN-POLYMYXIN B GU 40-200000 IR SOLN
Status: AC
  Filled 2016-02-11: qty 4

## 2016-02-11 MED ORDER — CLINDAMYCIN PHOSPHATE 600 MG/50ML IV SOLN
600.0000 mg | Freq: Four times a day (QID) | INTRAVENOUS | Status: AC
  Administered 2016-02-11 – 2016-02-12 (×2): 600 mg via INTRAVENOUS
  Filled 2016-02-11 (×3): qty 50

## 2016-02-11 MED ORDER — ROCURONIUM BROMIDE 100 MG/10ML IV SOLN
INTRAVENOUS | Status: DC | PRN
  Administered 2016-02-11: 10 mg via INTRAVENOUS

## 2016-02-11 MED ORDER — SODIUM CHLORIDE 0.9 % IV SOLN
INTRAVENOUS | Status: DC
  Administered 2016-02-11: 23:00:00 via INTRAVENOUS

## 2016-02-11 MED ORDER — FENTANYL CITRATE (PF) 100 MCG/2ML IJ SOLN
INTRAMUSCULAR | Status: DC | PRN
  Administered 2016-02-11 (×2): 50 ug via INTRAVENOUS

## 2016-02-11 MED ORDER — OXYCODONE HCL 5 MG PO TABS: 5.0000 mg | ORAL_TABLET | Freq: Once | ORAL | Status: DC | PRN

## 2016-02-11 MED ORDER — METOCLOPRAMIDE HCL 5 MG/ML IJ SOLN: 5.0000 mg | Freq: Three times a day (TID) | INTRAMUSCULAR | Status: DC | PRN

## 2016-02-11 MED ORDER — SUGAMMADEX SODIUM 200 MG/2ML IV SOLN
INTRAVENOUS | Status: DC | PRN
  Administered 2016-02-11: 140 mg via INTRAVENOUS

## 2016-02-11 MED ORDER — FERROUS SULFATE 325 (65 FE) MG PO TABS
325.0000 mg | ORAL_TABLET | Freq: Two times a day (BID) | ORAL | Status: DC
  Administered 2016-02-12 – 2016-02-13 (×3): 325 mg via ORAL
  Filled 2016-02-11 (×4): qty 1

## 2016-02-11 MED ORDER — ACETAMINOPHEN 650 MG RE SUPP: 650.0000 mg | Freq: Four times a day (QID) | RECTAL | Status: DC | PRN

## 2016-02-11 MED ORDER — ACETAMINOPHEN 325 MG PO TABS: 650.0000 mg | ORAL_TABLET | Freq: Four times a day (QID) | ORAL | Status: DC | PRN

## 2016-02-11 MED ORDER — SENNOSIDES-DOCUSATE SODIUM 8.6-50 MG PO TABS
1.0000 | ORAL_TABLET | Freq: Two times a day (BID) | ORAL | Status: DC
  Administered 2016-02-11 – 2016-02-13 (×4): 1 via ORAL
  Filled 2016-02-11 (×5): qty 1

## 2016-02-11 MED ORDER — MEPERIDINE HCL 25 MG/ML IJ SOLN: 6.2500 mg | INTRAMUSCULAR | Status: DC | PRN

## 2016-02-11 MED ORDER — PROMETHAZINE HCL 25 MG/ML IJ SOLN: 6.2500 mg | INTRAMUSCULAR | Status: DC | PRN

## 2016-02-11 SURGICAL SUPPLY — 35 items
BLADE HELICAL TI 11.0X100 (BLADE) ×2 IMPLANT
CANISTER SUCT 1200ML W/VALVE (MISCELLANEOUS) ×3 IMPLANT
DRAPE C-ARMOR (DRAPES) ×3 IMPLANT
DRAPE SHEET LG 3/4 BI-LAMINATE (DRAPES) ×3 IMPLANT
DRAPE TABLE BACK 80X90 (DRAPES) ×3 IMPLANT
DRSG DERMACEA 8X12 NADH (GAUZE/BANDAGES/DRESSINGS) ×3 IMPLANT
DRSG OPSITE POSTOP 3X4 (GAUZE/BANDAGES/DRESSINGS) ×3 IMPLANT
DRSG OPSITE POSTOP 4X12 (GAUZE/BANDAGES/DRESSINGS) ×3 IMPLANT
DURAPREP 26ML APPLICATOR (WOUND CARE) ×3 IMPLANT
ELECT REM PT RETURN 9FT ADLT (ELECTROSURGICAL) ×3
ELECTRODE REM PT RTRN 9FT ADLT (ELECTROSURGICAL) ×1 IMPLANT
GAUZE SPONGE 4X4 12PLY STRL (GAUZE/BANDAGES/DRESSINGS) ×3 IMPLANT
GLOVE BIO SURGEON STRL SZ7.5 (GLOVE) ×3 IMPLANT
GLOVE BIO SURGEON STRL SZ8 (GLOVE) ×3 IMPLANT
GLOVE BIOGEL M STRL SZ7.5 (GLOVE) ×3 IMPLANT
GLOVE INDICATOR 8.0 STRL GRN (GLOVE) ×3 IMPLANT
GOWN STRL REUS W/ TWL LRG LVL3 (GOWN DISPOSABLE) ×2 IMPLANT
GOWN STRL REUS W/TWL LRG LVL3 (GOWN DISPOSABLE) ×6
KIT RM TURNOVER CYSTO AR (KITS) ×3 IMPLANT
MAT BLUE FLOOR 46X72 FLO (MISCELLANEOUS) ×3 IMPLANT
NAIL TROCH FX 11/130D 170-S (Nail) ×2 IMPLANT
NS IRRIG 1000ML POUR BTL (IV SOLUTION) ×3 IMPLANT
PACK HIP COMPR (MISCELLANEOUS) ×3 IMPLANT
REAMER ROD DEEP FLUTE 2.5X950 (INSTRUMENTS) ×3 IMPLANT
SCREW LOCK TI 5.0X38 F/IM NAIL (Screw) ×2 IMPLANT
SOL PREP PVP 2OZ (MISCELLANEOUS) ×3
SOLUTION PREP PVP 2OZ (MISCELLANEOUS) ×1 IMPLANT
STAPLER SKIN PROX 35W (STAPLE) ×3 IMPLANT
SUCTION FRAZIER HANDLE 10FR (MISCELLANEOUS) ×2
SUCTION TUBE FRAZIER 10FR DISP (MISCELLANEOUS) ×1 IMPLANT
SUT VIC AB 0 CT1 36 (SUTURE) ×3 IMPLANT
SUT VIC AB 1 CT1 36 (SUTURE) ×3 IMPLANT
SUT VIC AB 2-0 CT1 27 (SUTURE) ×3
SUT VIC AB 2-0 CT1 TAPERPNT 27 (SUTURE) ×1 IMPLANT
TAPE MICROFOAM 4IN (TAPE) ×3 IMPLANT

## 2016-02-11 NOTE — Transfer of Care (Signed)
Immediate Anesthesia Transfer of Care Note  Patient: [name]  Procedure(s) Performed: Procedure(s): INTRAMEDULLARY (IM) NAIL INTERTROCHANTRIC (Left)  Patient Location: PACU  Anesthesia Type:General  Level of Consciousness: sedated  Airway & Oxygen Therapy: Patient connected to face mask oxygen  Post-op Assessment: Post -op Vital signs reviewed and stable  Post vital signs: stable  Last Vitals:  Vitals:   02/11/16 1404 02/11/16 1804  BP: 125/65 (!) 144/54  Pulse: 66 62  Resp:  16  Temp:  36.3 C    Last Pain:  Vitals:   02/11/16 1804  TempSrc: Temporal  PainSc:          Complications: No apparent anesthesia complications

## 2016-02-11 NOTE — Anesthesia Preprocedure Evaluation (Signed)
Anesthesia Evaluation  Patient identified by MRN, date of birth, ID band Patient awake    Reviewed: Allergy & Precautions, NPO status , Patient's Chart, lab work & pertinent test results, reviewed documented beta blocker date and time   History of Anesthesia Complications Negative for: history of anesthetic complications  Airway Mallampati: III  TM Distance: >3 FB Neck ROM: Full    Dental  (+) Poor Dentition   Pulmonary neg sleep apnea, neg COPD,    breath sounds clear to auscultation- rhonchi (-) wheezing      Cardiovascular hypertension, Pt. on medications and Pt. on home beta blockers (-) CAD and (-) Past MI  Rhythm:Regular Rate:Normal - Systolic murmurs and - Diastolic murmurs    Neuro/Psych Depression negative neurological ROS     GI/Hepatic Neg liver ROS, GERD  ,  Endo/Other  negative endocrine ROSneg diabetes  Renal/GU negative Renal ROS     Musculoskeletal  (+) Arthritis , Osteoarthritis,    Abdominal (+) - obese,   Peds  Hematology negative hematology ROS (+) On warfarin for recurrent DVT   Anesthesia Other Findings Past Medical History: No date: Arthritis No date: Cancer ([place])     Comment: BREAST No date: Cough     Comment: CHRONIC No date: Depression No date: Dizziness No date: DVT (deep venous thrombosis) (HCC) No date: Edema     Comment: FEET/LEGS No date: GERD (gastroesophageal reflux disease) No date: Heart murmur No date: Heart palpitations No date: Hepatitis No date: HOH (hard of hearing) No date: Hyperlipidemia No date: Hypertension No date: Osteoporosis No date: Shortness of breath dyspnea No date: Tremors of nervous system   Reproductive/Obstetrics                             Anesthesia Physical Anesthesia Plan  ASA: III  Anesthesia Plan: General   Post-op Pain Management:    Induction: Intravenous  Airway Management Planned: Oral  ETT  Additional Equipment:   Intra-op Plan:   Post-operative Plan: Extubation in OR  Informed Consent: I have reviewed the patients History and Physical, chart, labs and discussed the procedure including the risks, benefits and alternatives for the proposed anesthesia with the patient or authorized representative who has indicated his/her understanding and acceptance.   Dental advisory given  Plan Discussed with: CRNA and Anesthesiologist  Anesthesia Plan Comments:         Anesthesia Quick Evaluation

## 2016-02-11 NOTE — Anesthesia Procedure Notes (Signed)
Procedure Name: Intubation Date/Time: 02/11/2016 3:45 PM Performed by: Molli Barrows Pre-anesthesia Checklist: Patient identified, Emergency Drugs available, Suction available and Patient being monitored Patient Re-evaluated:Patient Re-evaluated prior to inductionOxygen Delivery Method: Circle system utilized Preoxygenation: Pre-oxygenation with 100% oxygen Intubation Type: IV induction Ventilation: Mask ventilation without difficulty Laryngoscope Size: Miller and 2 Grade View: Grade II Tube type: Oral Tube size: 7.0 mm Number of attempts: 2 Airway Equipment and Method: Bougie stylet Placement Confirmation: positive ETCO2 and breath sounds checked- equal and bilateral Secured at: 22 cm Tube secured with: Tape Dental Injury: Teeth and Oropharynx as per pre-operative assessment  Difficulty Due To: Difficult Airway- due to anterior larynx

## 2016-02-11 NOTE — Brief Op Note (Signed)
02/10/2016 - 02/11/2016  6:04 PM  PATIENT:  [name]  82 y.o. female  PRE-OPERATIVE DIAGNOSIS:  Left intertrochanteric femur fracture  POST-OPERATIVE DIAGNOSIS:  Same  PROCEDURE:  Procedure(s): INTRAMEDULLARY (IM) NAIL INTERTROCHANTRIC (Left)  SURGEON:  Surgeon(s) and Role:    * Dereck Leep, MD - Primary  ASSISTANTS: none   ANESTHESIA:   general  EBL:  Total I/O In: -  Out: 250 [Urine:200; Blood:50]  BLOOD ADMINISTERED:none  DRAINS: none   LOCAL MEDICATIONS USED:  NONE  SPECIMEN:  No Specimen  DISPOSITION OF SPECIMEN:  N/A  COUNTS:  YES  TOURNIQUET:  * No tourniquets in log *  DICTATION: .Dragon Dictation  PLAN OF CARE: Admit to inpatient   PATIENT DISPOSITION:  PACU - hemodynamically stable.   Delay start of Pharmacological VTE agent (>24hrs) due to surgical blood loss or risk of bleeding: yes

## 2016-02-11 NOTE — OR Nursing (Signed)
Bucks traction removed on the floor prior to transfer to preop. Drn Hooten made aware did not want it replaced

## 2016-02-11 NOTE — Clinical Social Work Note (Signed)
Clinical Social Work Assessment  Patient Details  Name: [name] MRN: [id number] Date of Birth: 04-Apr-1925  Date of referral:  02/11/16               Reason for consult:  Facility Placement                Permission sought to share information with:  Chartered certified accountant granted to share information::  Yes, Verbal Permission Granted  Name::      [place]::   [place]   Relationship::     Contact Information:     Housing/Transportation Living arrangements for the past 2 months:  [place] of Information:  Adult Children Patient Interpreter Needed:  None Criminal Activity/Legal Involvement Pertinent to Current Situation/Hospitalization:  No - Comment as needed Significant Relationships:  Adult Children Lives with:  Self Do you feel safe going back to the place where you live?    Need for family participation in patient care:  Yes (Comment)  Care giving concerns:  Patient lives at [facility] in [place].    Social Worker assessment / plan:  Holiday representative ([place]) reviewed chart and noted that patient is having surgery today. CSW attempted to meet with patient however she was already out of the room for surgery. CSW contacted patient's daughter [name] listed on the face sheet. Per daughter patient has 3 adult children herself along with her 2 brothers Hoy Morn and Lanny Hurst. Per [place] they all 3 share HPOA. Per [place] patient lives in the [facility] in [place]. CSW explained that PT will work with patient after surgery and may recommend home health or SNF. CSW explained SNF process and that patient will require a 3 night qualifying inpatient stay in order for Medicare to pay for SNF. Daughter verbalized her understanding and reported that she prefers [facility].   FL2 complete and faxed out. CSW will continue to follow and assist as needed.   Employment status:   Retired Forensic scientist:  Medicare PT Recommendations:  Not assessed at this time Information / Referral to community resources:  [place]  Patient/Family's Response to care:  Patient's daughter is agreeable to [employer].   Patient/Family's Understanding of and Emotional Response to Diagnosis, Current Treatment, and Prognosis:  Patient's daughter was pleasant and thanked CSW for visit.   Emotional Assessment Appearance:  Appears stated age Attitude/Demeanor/Rapport:  Unable to Assess Affect (typically observed):  Unable to Assess Orientation:  Oriented to Self, Oriented to Place, Oriented to  Time, Oriented to Situation Alcohol / Substance use:  Not Applicable Psych involvement (Current and /or in the community):  No (Comment)  Discharge Needs  Concerns to be addressed:  Discharge Planning Concerns Readmission within the last 30 days:  No Current discharge risk:  Dependent with Mobility Barriers to Discharge:  Continued Medical Work up   [employer], Veronia Beets, LCSW 02/11/2016, 4:53 PM

## 2016-02-11 NOTE — Clinical Social Work Placement (Signed)
   CLINICAL SOCIAL WORK PLACEMENT  NOTE  Date:  02/11/2016  Patient Details  Name: [name] MRN: [id number] Date of Birth: 07/11/1924  Clinical Social Work is seeking post-discharge placement for this patient at the [place] level of care (*CSW will initial, date and re-position this form in  chart as items are completed):  Yes   Patient/family provided with [place] Work Department's list of facilities offering this level of care within the geographic area requested by the patient (or if unable, by the patient's family).  Yes   Patient/family informed of their freedom to choose among providers that offer the needed level of care, that participate in Medicare, Medicaid or managed care program needed by the patient, have an available bed and are willing to accept the patient.  Yes   Patient/family informed of [facility]'s ownership interest in [facility] and [facility], as well as of the fact that they are under no obligation to receive care at these facilities.  PASRR submitted to EDS on 02/10/16     PASRR number received on 02/10/16     Existing PASRR number confirmed on       FL2 transmitted to all facilities in geographic area requested by pt/family on 02/11/16     FL2 transmitted to all facilities within larger geographic area on       Patient informed that his/her managed care company has contracts with or will negotiate with certain facilities, including the following:            Patient/family informed of bed offers received.  Patient chooses bed at       Physician recommends and patient chooses bed at      Patient to be transferred to   on  .  Patient to be transferred to facility by       Patient family notified on   of transfer.  Name of family member notified:        PHYSICIAN       Additional Comment:    _______________________________________________ Shemeka Wardle, Veronia Beets, LCSW 02/11/2016, 4:51 PM

## 2016-02-11 NOTE — Op Note (Signed)
OPERATIVE NOTE  DATE OF SURGERY:  02/11/2016  PATIENT NAME:  [name]   DOB: 04-01-1925  MRN: [id number]  PRE-OPERATIVE DIAGNOSIS: Left intertrochanteric femur fracture  POST-OPERATIVE DIAGNOSIS:  Same  PROCEDURE: Open reduction and internal fixation of a left intertrochanteric femur fracture   SURGEON:  Marciano Sequin. M.D.  ANESTHESIA: general  ESTIMATED BLOOD LOSS: 50 mL  FLUIDS REPLACED: 700 mL of crystalloid  DRAINS: None  IMPLANTS UTILIZED: Synthes 11 mm/130 trochanteric fixation nail, 123XX123 mm helical blade, 38 mm x 5.0 mm locking screw  INDICATIONS FOR SURGERY: [name] is a 82 y.o. year old female who fell and sustained a displaced left intertrochanteric femur fracture. After discussion of the risks and benefits of surgical intervention, the patient expressed understanding of the risks benefits and agree with plans for open reduction and internal fixation.   The risks, benefits, and alternatives were discussed at length including but not limited to the risks of infection, bleeding, nerve injury, stiffness, blood clots, the need for revision surgery, limb length inequality, cardiopulmonary complications, among others, and they were willing to proceed.  PROCEDURE IN DETAIL: The patient was brought into the operating room and, after adequate general anesthesia was achieved, patient was placed on the fracture table. All bony prominences were well-padded. The left lower extremity was placed in traction and a provisional reduction was performed and verified using the C-arm. The patient's left hip and leg were cleaned and prepped with alcohol and DuraPrep and draped in the usual sterile fashion. A "timeout" was performed as per usual protocol. A lateral incision was made extended from the proximal portion of the greater trochanter proximally. The fascia was incised in line with the skin incision and the fibers of the hip abductors were split in line. The tip of the  greater trochanter was palpated and a distally threaded guide pin was inserted into the tip of the greater trochanter and advanced into the medullary canal. Position was confirmed in both AP and lateral planes using the C-arm. A pilot hole was enlarged using a step drill. A Synthes 11 mm/130 trochanteric fixation nail was advanced over the guidepin and position confirmed using the C-arm. A second stab incision was made and the tissue protector was inserted through the outrigger device and advanced to the lateral cortex of the femur. A threaded screw guide pin was inserted into the femoral neck and head and position was again confirmed in both AP and lateral planes. Vision was were obtained and it was felt that a 123XX123 mm helical blade was appropriate. The cortex was reamed and then a cannulated reamer was advanced over the guidepin to the appropriate depth. A 123XX123 mm helical blade was then advanced over the guidepin and impacted into place. Good position was noted in multiple planes using the C-arm. The locking sleeve was engaged. Finally, a third stab incision was made and the tissue protector was inserted through the outrigger device and advanced the lateral cortex of the femur for placement of the distal locking screw. A 38 mm x 5.0 mm locking screw was then inserted. The outrigger device was removed. The hip was visualized in all planes using the C-arm with good reduction appreciated and good position of the hardware noted.  The wound was irrigated with copious amounts of normal saline with antibiotic solution and suctioned dry. Good hemostasis was appreciated. The fascia was reapproximated using interrupted sutures of #1 Vicryl. Subcutaneous tissue was approximated layers using first #0 Vicryl followed #2-0 Vicryl.  The skin was closed with skin staples. A sterile dressing was applied.  The patient tolerated the procedure well and was transported to the recovery room in stable condition.   Marciano Sequin., M.D.

## 2016-02-11 NOTE — Care Management Note (Signed)
Case Management Note  Patient Details  Name: [name] MRN: [id number] Date of Birth: 06/08/1925  Subjective/Objective:   Spoke with patient for discharge planning. Patient is from home which is [facility]. Patient is alert and answers questions               Appropriately. Family is present in room. Discussed discharge planning. Patient is scheduled for surgical intervention today at 2pm.  Daughter Berline Chough given my contact information. Anticipate that patient will need SNF at discharge. More to follow. Action/Plan:Anticipated plan is SNF.   Expected Discharge Date:  02/13/16               Expected Discharge Plan:  Skilled Nursing Facility  In-House Referral:  Clinical Social Work  Discharge planning Services  CM Consult  Post Acute Care Choice:    Choice offered to:     DME Arranged:    DME Agency:     HH Arranged:    [place] Agency:     Status of Service:  In process, will continue to follow  If discussed at Long Length of Stay Meetings, dates discussed:    Additional Comments:  Alvie Heidelberg, RN 02/11/2016, 1:07 PM

## 2016-02-12 ENCOUNTER — Encounter: Payer: Self-pay | Admitting: Orthopedic Surgery

## 2016-02-12 LAB — BASIC METABOLIC PANEL
Anion gap: 0 — ABNORMAL LOW (ref 5–15)
BUN: 26 mg/dL — AB (ref 6–20)
CHLORIDE: 109 mmol/L (ref 101–111)
CO2: 30 mmol/L (ref 22–32)
CREATININE: 0.6 mg/dL (ref 0.44–1.00)
Calcium: 8 mg/dL — ABNORMAL LOW (ref 8.9–10.3)
GFR calc Af Amer: >60 mL/min (ref >60)
GFR calc non Af Amer: >60 mL/min (ref >60)
Glucose, Bld: 108 mg/dL — ABNORMAL HIGH (ref 65–99)
Potassium: 4 mmol/L (ref 3.5–5.1)
SODIUM: 139 mmol/L (ref 135–145)

## 2016-02-12 LAB — PROTIME-INR
INR: 1.57
Prothrombin Time: 18.9 seconds — ABNORMAL HIGH (ref 11.4–15.2)

## 2016-02-12 LAB — URINE CULTURE
CULTURE: NO GROWTH
Special Requests: NORMAL

## 2016-02-12 LAB — CBC
HCT: 31.3 % — ABNORMAL LOW (ref 35.0–47.0)
Hemoglobin: 10.5 g/dL — ABNORMAL LOW (ref 12.0–16.0)
MCH: 28.4 pg (ref 26.0–34.0)
MCHC: 33.5 g/dL (ref 32.0–36.0)
MCV: 84.7 fL (ref 80.0–100.0)
PLATELETS: 273 10*3/uL (ref 150–440)
RBC: 3.69 MIL/uL — ABNORMAL LOW (ref 3.80–5.20)
RDW: 15.7 % — AB (ref 11.5–14.5)
WBC: 15.7 10*3/uL — AB (ref 3.6–11.0)

## 2016-02-12 MED ORDER — DIFLUPREDNATE 0.05 % OP EMUL
1.0000 [drp] | Freq: Two times a day (BID) | OPHTHALMIC | Status: DC
  Administered 2016-02-12 – 2016-02-13 (×2): 1 [drp] via OPHTHALMIC

## 2016-02-12 NOTE — Progress Notes (Addendum)
[place] at [facility]                                                                                                                                                                                            Patient Demographics   [name], is a 82 y.o. female, DOB - 11/04/1924, [id number]  Admit date - 02/10/2016   Admitting Physician Hillary Bow, MD  Outpatient Primary MD for the patient is SPARKS,JEFFREY D, MD   LOS - 2  Subjective: Patient admitted with left hip fracutre, c/o pain in hip, denies any cp or sob Wbc counted elevated    Review of Systems:   CONSTITUTIONAL: No documented fever. No fatigue, weakness. No weight gain, no weight loss.  EYES: No blurry or double vision.  ENT: No tinnitus. No postnasal drip. No redness of the oropharynx.  RESPIRATORY: No cough, no wheeze, no hemoptysis. No dyspnea.  CARDIOVASCULAR: No chest pain. No orthopnea. No palpitations. No syncope.  GASTROINTESTINAL: No nausea, no vomiting or diarrhea. No abdominal pain. No melena or hematochezia.  GENITOURINARY: No dysuria or hematuria.  ENDOCRINE: No polyuria or nocturia. No heat or cold intolerance.  HEMATOLOGY: No anemia. No bruising. No bleeding.  INTEGUMENTARY: No rashes. No lesions.  MUSCULOSKELETAL: No arthritis. No swelling. No gout.  + left hip pain NEUROLOGIC: No numbness, tingling, or ataxia. No seizure-type activity.  PSYCHIATRIC: No anxiety. No insomnia. No ADD.    Vitals:   Vitals:   02/12/16 0000 02/12/16 0350 02/12/16 0500 02/12/16 0736  BP: (!) 108/46 (!) 101/52  (!) 113/51  Pulse: (!) 56 (!) 58  (!) 58  Resp: 19 19  18   Temp: 98.6 F (37 C) 98.6 F (37 C)  98.1 F (36.7 C)  TempSrc:    Oral  SpO2: 100% 100%  100%  Weight:   169 lb (76.7 kg)   Height:        Wt Readings from Last 3 Encounters:  02/12/16 169 lb (76.7 kg)  02/02/16 145 lb (65.8 kg)  12/09/15 145 lb (65.8 kg)     Intake/Output Summary (Last 24  hours) at 02/12/16 1313 Last data filed at 02/12/16 0434  Gross per 24 hour  Intake           491.25 ml  Output              400 ml  Net            91.25 ml    Physical Exam:   GENERAL: Pleasant-appearing in no apparent distress.  HEAD, EYES, EARS, NOSE AND THROAT: Atraumatic, normocephalic. Extraocular muscles  are intact. Pupils equal and reactive to light. Sclerae anicteric. No conjunctival injection. No oro-pharyngeal erythema.  NECK: Supple. There is no jugular venous distention. No bruits, no lymphadenopathy, no thyromegaly.  HEART: Regular rate and rhythm,. No murmurs, no rubs, no clicks.  LUNGS: Clear to auscultation bilaterally. No rales or rhonchi. No wheezes.  ABDOMEN: Soft, flat, nontender, nondistended. Has good bowel sounds. No hepatosplenomegaly appreciated.  EXTREMITIES: No evidence of any cyanosis, clubbing, or peripheral edema.  +2 pedal and radial pulses bilaterally.  NEUROLOGIC: The patient is alert, awake, and oriented x3 with no focal motor or sensory deficits appreciated bilaterally.  SKIN: Moist and warm with no rashes appreciated.  Psych: Not anxious, depressed LN: No inguinal LN enlargement    Antibiotics   Anti-infectives    Start     Dose/Rate Route Frequency Ordered Stop   02/11/16 2100  clindamycin (CLEOCIN) IVPB 600 mg     600 mg 100 mL/hr over 30 Minutes Intravenous Every 6 hours 02/11/16 1911 02/12/16 0309   02/11/16 0700  clindamycin (CLEOCIN) IVPB 600 mg    Comments:  SEND WITH THE PATIENT TO THE OR. DO NOT ADMINISTER ON THE UNIT!   600 mg 100 mL/hr over 30 Minutes Intravenous To Surgery 02/10/16 1906 02/11/16 1611   02/10/16 1600  clindamycin (CLEOCIN) IVPB 600 mg  Status:  Discontinued    Comments:  SEND WITH THE PATIENT TO THE OR. DO NOT ADMINISTER ON THE UNIT!   600 mg 100 mL/hr over 30 Minutes Intravenous To Surgery 02/10/16 1545 02/10/16 1906      Medications   Scheduled Meds: . acetaminophen  1,000 mg Intravenous Q6H  .  Difluprednate  1 drop Ophthalmic BID  . ferrous sulfate  325 mg Oral BID WC  . metoCLOPramide  10 mg Oral TID AC & HS  . pantoprazole  40 mg Oral BID  . propranolol ER  60 mg Oral Daily  . senna-docusate  1 tablet Oral BID  . warfarin  3 mg Oral q1800  . Warfarin - Physician Dosing Inpatient   Does not apply q1800   Continuous Infusions: . sodium chloride 75 mL/hr at 02/12/16 0241   PRN Meds:.acetaminophen **OR** acetaminophen, albuterol, bisacodyl, hydrALAZINE, magnesium hydroxide, menthol-cetylpyridinium **OR** phenol, meperidine (DEMEROL) injection, metoCLOPramide **OR** metoCLOPramide (REGLAN) injection, ondansetron **OR** ondansetron (ZOFRAN) IV, oxyCODONE, sodium phosphate, traMADol   Data Review:   Micro Results Recent Results (from the past 240 hour(s))  Surgical PCR screen     Status: None   Collection Time: 02/10/16  5:00 PM  Result Value Ref Range Status   MRSA, PCR NEGATIVE NEGATIVE Final   Staphylococcus aureus NEGATIVE NEGATIVE Final    Comment:        The Xpert SA Assay (FDA approved for NASAL specimens in patients over 1 years of age), is one component of a comprehensive surveillance program.  Test performance has been validated by [facility] for patients greater than or equal to 78 year old. It is not intended to diagnose infection nor to guide or monitor treatment.     Radiology Reports Dg Chest 1 View  Result Date: 02/10/2016 CLINICAL DATA:  [place] yesterday with shoulder and hip pain EXAM: CHEST 1 VIEW COMPARISON:  CT chest of 09/10/2014 and chest x-ray of 10/19/2011 FINDINGS: There is linear opacity in the right mid lung. This opacity likely is chronic although atelectasis cannot be excluded. Vague nodular opacities are present overlying the left anterior second and third ribs possibly related to small lung nodules noted  on prior CT of the chest consistent with a chronic indolent infection. No pneumonia or effusion is currently seen. The heart is within  normal limits in size. Surgical clips overlie the left axilla. The bones are osteopenic. IMPRESSION: 1. No definite active process. 2. Linear atelectasis or scarring the right mid lung. 3. Vague nodular opacities overlie the left anterior second and third ribs possibly representing small nodules noted on prior CT of the chest. Electronically Signed   By: Ivar Drape M.D.   On: 02/10/2016 12:48   Ct Head Wo Contrast  Result Date: 02/10/2016 CLINICAL DATA:  Status post fall.  Left hip and left leg pain. EXAM: CT HEAD WITHOUT CONTRAST TECHNIQUE: Contiguous axial images were obtained from the base of the skull through the vertex without intravenous contrast. COMPARISON:  MR brain 09/12/2015 FINDINGS: Brain: No evidence of acute infarction, hemorrhage, extra-axial collection, ventriculomegaly, or mass effect. Generalized cerebral atrophy. Periventricular white matter low attenuation likely secondary to microangiopathy. Vascular: Cerebrovascular atherosclerotic calcifications are noted. Skull: Negative for fracture or focal lesion. Sinuses/Orbits: Visualized portions of the orbits are unremarkable. Visualized portions of the paranasal sinuses and mastoid air cells are unremarkable. Other: None. IMPRESSION: 1. No acute intracranial pathology. 2. Chronic microvascular disease and cerebral atrophy. Electronically Signed   By: Kathreen Devoid   On: 02/10/2016 12:06   Dg Shoulder Left  Result Date: 02/10/2016 CLINICAL DATA:  Recent fall with left shoulder pain EXAM: LEFT SHOULDER - 2+ VIEW COMPARISON:  CT chest of 09/10/2014 FINDINGS: The bones are diffusely osteopenic. The left humeral head is in normal position with only mild degenerative joint disease for age. No fracture is seen. The left [facility] joint is normally aligned. The ribs that are visualized appear intact. Subtle nodularity in the left mid upper lung field may be due to chronic indolent infection as described on prior CT of the chest. IMPRESSION: Osteopenia.  No  acute fracture. Electronically Signed   By: Ivar Drape M.D.   On: 02/10/2016 12:51   Dg Hip Operative Unilat W Or W/o Pelvis Left  Result Date: 02/11/2016 CLINICAL DATA:  ORIF left proximal femur fracture EXAM: OPERATIVE LEFT HIP (WITH PELVIS IF PERFORMED) 4 VIEWS TECHNIQUE: Fluoroscopic spot image(s) were submitted for interpretation post-operatively. COMPARISON:  02/10/2016 left hip radiograph FINDINGS: Fluoroscopy time 1 minutes 6 seconds. For spot fluoroscopic intraoperative nondiagnostic radiographs of the left hip demonstrate transfixation of the intertrochanteric left femur fracture with intra medullary rod and interlocking left femoral neck pain and distal interlocking screw. IMPRESSION: Intraoperative fluoroscopic guidance for ORIF left proximal femur fracture. Electronically Signed   By: Ilona Sorrel M.D.   On: 02/11/2016 17:38   Dg Hip Unilat With Pelvis 2-3 Views Left  Result Date: 02/10/2016 CLINICAL DATA:  Status post fall.  Left hip and shoulder pain. EXAM: DG HIP (WITH OR WITHOUT PELVIS) 2-3V LEFT COMPARISON:  None. FINDINGS: Comminuted left intertrochanteric fracture with mild displacement. No right hip fracture. No dislocation. Mild osteoarthritis of bilateral hips. Mild osteoarthritis of bilateral sacroiliac joints. IMPRESSION: Acute, comminuted left intertrochanteric fracture. Electronically Signed   By: Kathreen Devoid   On: 02/10/2016 12:45     CBC  Recent Labs Lab 02/10/16 1251 02/11/16 0445 02/12/16 0539  WBC 25.8* 19.5* 15.7*  HGB 14.0 11.8* 10.5*  HCT 41.5 35.3 31.3*  PLT 368 315 273  MCV 82.6 84.0 84.7  MCH 27.8 28.1 28.4  MCHC 33.7 33.5 33.5  RDW 15.4* 15.7* 15.7*  LYMPHSABS 0.8*  --   --  MONOABS 2.6*  --   --   EOSABS 0.0  --   --   BASOSABS 0.0  --   --     Chemistries   Recent Labs Lab 02/10/16 1037 02/11/16 0445 02/12/16 0539  NA 134* 137 139  K 4.3 3.9 4.0  CL 101 107 109  CO2 24 28 30   GLUCOSE 169* 128* 108*  BUN 26* 31* 26*  CREATININE  0.67 0.80 0.60  CALCIUM 9.3 8.6* 8.0*   ------------------------------------------------------------------------------------------------------------------ estimated creatinine clearance is 50.9 mL/min (by C-G formula based on SCr of 0.8 mg/dL). ------------------------------------------------------------------------------------------------------------------ No results for input(s): HGBA1C in the last 72 hours. ------------------------------------------------------------------------------------------------------------------ No results for input(s): CHOL, HDL, LDLCALC, TRIG, CHOLHDL, LDLDIRECT in the last 72 hours. ------------------------------------------------------------------------------------------------------------------ No results for input(s): TSH, T4TOTAL, T3FREE, THYROIDAB in the last 72 hours.  Invalid input(s): FREET3 ------------------------------------------------------------------------------------------------------------------ No results for input(s): VITAMINB12, FOLATE, FERRITIN, TIBC, IRON, RETICCTPCT in the last 72 hours.  Coagulation profile  Recent Labs Lab 02/10/16 1037 02/11/16 0445 02/12/16 0539  INR 1.40 1.45 1.57    No results for input(s): DDIMER in the last 72 hours.  Cardiac Enzymes No results for input(s): CKMB, TROPONINI, MYOGLOBIN in the last 168 hours.  Invalid input(s): CK ------------------------------------------------------------------------------------------------------------------ Invalid input(s): [place]  Patient is 5 s/p faill with left hip fx  * Left hip fracture S/p hip repair Control pain Seen by physical therapy now sitting in a chair  * Acute rhabdomyolysis Continue ivf repeat cpk in the a.m.   * Leukocytosis trending down Chest x-ray is negative UA is negative Likely stress-induced repeat CBC in the morning  * Central hypertension Continue inderal blood pressure stable  *gerd  Continue  protonix  Misc: continue warfarin     Code Status Orders        Start     Ordered   02/10/16 1323  Full code  Continuous     02/10/16 1324    Code Status History    Date Active Date Inactive Code Status Order ID Comments User Context   This patient has a current code status but no historical code status.    Advance Directive Documentation   Flowsheet Row Most Recent Value  Type of Advance Directive  Healthcare Power of Attorney, Living will  Pre-existing out of facility DNR order (yellow form or pink MOST form)  No data  "MOST" Form in Place?  No data           Consults   ortho  DVT Prophylaxis  warfarin  Lab Results  Component Value Date   PLT 273 02/12/2016     Time Spent in minutes   31min  Greater than 50% of time spent in care coordination and counseling patient regarding the condition and plan of care.   Dustin Flock M.D on 02/12/2016 at 1:13 PM  Between 7am to 6pm - Pager - [phone]  After 6pm go to [email or web address] - password [email or web address] [facility]   Office  [phone]

## 2016-02-12 NOTE — Anesthesia Postprocedure Evaluation (Signed)
Anesthesia Post Note  Patient: [name]  Procedure(s) Performed: Procedure(s) (LRB): INTRAMEDULLARY (IM) NAIL INTERTROCHANTRIC (Left)  Patient location during evaluation: PACU Anesthesia Type: General Level of consciousness: awake and alert Pain management: pain level controlled Vital Signs Assessment: post-procedure vital signs reviewed and stable Respiratory status: spontaneous breathing, nonlabored ventilation, respiratory function stable and patient connected to nasal cannula oxygen Cardiovascular status: blood pressure returned to baseline and stable Postop Assessment: no signs of nausea or vomiting Anesthetic complications: no    Last Vitals:  Vitals:   02/12/16 0000 02/12/16 0350  BP: (!) 108/46 (!) 101/52  Pulse: (!) 56 (!) 58  Resp: 19 19  Temp: 37 C 37 C    Last Pain:  Vitals:   02/11/16 2252  TempSrc: Oral  PainSc:                  Molli Barrows

## 2016-02-12 NOTE — Progress Notes (Signed)
[place] at [facility]                                                                                                                                                                                            Patient Demographics   [name], is a 82 y.o. female, DOB - 02-Sep-1924, [id number]  Admit date - 02/10/2016   Admitting Physician Hillary Bow, MD  Outpatient Primary MD for the patient is SPARKS,JEFFREY D, MD   LOS - 2  Subjective: Patient admitted with left hip fracutre, c/o pain in hip, denies any cp or sob Wbc counted elevated    Review of Systems:   CONSTITUTIONAL: No documented fever. No fatigue, weakness. No weight gain, no weight loss.  EYES: No blurry or double vision.  ENT: No tinnitus. No postnasal drip. No redness of the oropharynx.  RESPIRATORY: No cough, no wheeze, no hemoptysis. No dyspnea.  CARDIOVASCULAR: No chest pain. No orthopnea. No palpitations. No syncope.  GASTROINTESTINAL: No nausea, no vomiting or diarrhea. No abdominal pain. No melena or hematochezia.  GENITOURINARY: No dysuria or hematuria.  ENDOCRINE: No polyuria or nocturia. No heat or cold intolerance.  HEMATOLOGY: No anemia. No bruising. No bleeding.  INTEGUMENTARY: No rashes. No lesions.  MUSCULOSKELETAL: No arthritis. No swelling. No gout.  + left hip pain NEUROLOGIC: No numbness, tingling, or ataxia. No seizure-type activity.  PSYCHIATRIC: No anxiety. No insomnia. No ADD.    Vitals:   Vitals:   02/12/16 0000 02/12/16 0350 02/12/16 0500 02/12/16 0736  BP: (!) 108/46 (!) 101/52  (!) 113/51  Pulse: (!) 56 (!) 58  (!) 58  Resp: 19 19  18   Temp: 98.6 F (37 C) 98.6 F (37 C)  98.1 F (36.7 C)  TempSrc:    Oral  SpO2: 100% 100%  100%  Weight:   169 lb (76.7 kg)   Height:        Wt Readings from Last 3 Encounters:  02/12/16 169 lb (76.7 kg)  02/02/16 145 lb (65.8 kg)  12/09/15 145 lb (65.8 kg)     Intake/Output Summary (Last 24  hours) at 02/12/16 1300 Last data filed at 02/12/16 0434  Gross per 24 hour  Intake           491.25 ml  Output              400 ml  Net            91.25 ml    Physical Exam:   GENERAL: Pleasant-appearing in no apparent distress.  HEAD, EYES, EARS, NOSE AND THROAT: Atraumatic, normocephalic. Extraocular muscles  are intact. Pupils equal and reactive to light. Sclerae anicteric. No conjunctival injection. No oro-pharyngeal erythema.  NECK: Supple. There is no jugular venous distention. No bruits, no lymphadenopathy, no thyromegaly.  HEART: Regular rate and rhythm,. No murmurs, no rubs, no clicks.  LUNGS: Clear to auscultation bilaterally. No rales or rhonchi. No wheezes.  ABDOMEN: Soft, flat, nontender, nondistended. Has good bowel sounds. No hepatosplenomegaly appreciated.  EXTREMITIES: No evidence of any cyanosis, clubbing, or peripheral edema.  +2 pedal and radial pulses bilaterally.  NEUROLOGIC: The patient is alert, awake, and oriented x3 with no focal motor or sensory deficits appreciated bilaterally.  SKIN: Moist and warm with no rashes appreciated.  Psych: Not anxious, depressed LN: No inguinal LN enlargement    Antibiotics   Anti-infectives    Start     Dose/Rate Route Frequency Ordered Stop   02/11/16 2100  clindamycin (CLEOCIN) IVPB 600 mg     600 mg 100 mL/hr over 30 Minutes Intravenous Every 6 hours 02/11/16 1911 02/12/16 0309   02/11/16 0700  clindamycin (CLEOCIN) IVPB 600 mg    Comments:  SEND WITH THE PATIENT TO THE OR. DO NOT ADMINISTER ON THE UNIT!   600 mg 100 mL/hr over 30 Minutes Intravenous To Surgery 02/10/16 1906 02/11/16 1611   02/10/16 1600  clindamycin (CLEOCIN) IVPB 600 mg  Status:  Discontinued    Comments:  SEND WITH THE PATIENT TO THE OR. DO NOT ADMINISTER ON THE UNIT!   600 mg 100 mL/hr over 30 Minutes Intravenous To Surgery 02/10/16 1545 02/10/16 1906      Medications   Scheduled Meds: . acetaminophen  1,000 mg Intravenous Q6H  .  Difluprednate  1 drop Ophthalmic BID  . ferrous sulfate  325 mg Oral BID WC  . metoCLOPramide  10 mg Oral TID AC & HS  . pantoprazole  40 mg Oral BID  . propranolol ER  60 mg Oral Daily  . senna-docusate  1 tablet Oral BID  . warfarin  3 mg Oral q1800  . Warfarin - Physician Dosing Inpatient   Does not apply q1800   Continuous Infusions: . sodium chloride 75 mL/hr at 02/12/16 0241   PRN Meds:.acetaminophen **OR** acetaminophen, albuterol, bisacodyl, hydrALAZINE, magnesium hydroxide, menthol-cetylpyridinium **OR** phenol, meperidine (DEMEROL) injection, metoCLOPramide **OR** metoCLOPramide (REGLAN) injection, ondansetron **OR** ondansetron (ZOFRAN) IV, oxyCODONE, sodium phosphate, traMADol   Data Review:   Micro Results Recent Results (from the past 240 hour(s))  Surgical PCR screen     Status: None   Collection Time: 02/10/16  5:00 PM  Result Value Ref Range Status   MRSA, PCR NEGATIVE NEGATIVE Final   Staphylococcus aureus NEGATIVE NEGATIVE Final    Comment:        The Xpert SA Assay (FDA approved for NASAL specimens in patients over 38 years of age), is one component of a comprehensive surveillance program.  Test performance has been validated by [facility] for patients greater than or equal to 80 year old. It is not intended to diagnose infection nor to guide or monitor treatment.     Radiology Reports Dg Chest 1 View  Result Date: 02/10/2016 CLINICAL DATA:  [place] yesterday with shoulder and hip pain EXAM: CHEST 1 VIEW COMPARISON:  CT chest of 09/10/2014 and chest x-ray of 10/19/2011 FINDINGS: There is linear opacity in the right mid lung. This opacity likely is chronic although atelectasis cannot be excluded. Vague nodular opacities are present overlying the left anterior second and third ribs possibly related to small lung nodules noted  on prior CT of the chest consistent with a chronic indolent infection. No pneumonia or effusion is currently seen. The heart is within  normal limits in size. Surgical clips overlie the left axilla. The bones are osteopenic. IMPRESSION: 1. No definite active process. 2. Linear atelectasis or scarring the right mid lung. 3. Vague nodular opacities overlie the left anterior second and third ribs possibly representing small nodules noted on prior CT of the chest. Electronically Signed   By: Ivar Drape M.D.   On: 02/10/2016 12:48   Ct Head Wo Contrast  Result Date: 02/10/2016 CLINICAL DATA:  Status post fall.  Left hip and left leg pain. EXAM: CT HEAD WITHOUT CONTRAST TECHNIQUE: Contiguous axial images were obtained from the base of the skull through the vertex without intravenous contrast. COMPARISON:  MR brain 09/12/2015 FINDINGS: Brain: No evidence of acute infarction, hemorrhage, extra-axial collection, ventriculomegaly, or mass effect. Generalized cerebral atrophy. Periventricular white matter low attenuation likely secondary to microangiopathy. Vascular: Cerebrovascular atherosclerotic calcifications are noted. Skull: Negative for fracture or focal lesion. Sinuses/Orbits: Visualized portions of the orbits are unremarkable. Visualized portions of the paranasal sinuses and mastoid air cells are unremarkable. Other: None. IMPRESSION: 1. No acute intracranial pathology. 2. Chronic microvascular disease and cerebral atrophy. Electronically Signed   By: Kathreen Devoid   On: 02/10/2016 12:06   Dg Shoulder Left  Result Date: 02/10/2016 CLINICAL DATA:  Recent fall with left shoulder pain EXAM: LEFT SHOULDER - 2+ VIEW COMPARISON:  CT chest of 09/10/2014 FINDINGS: The bones are diffusely osteopenic. The left humeral head is in normal position with only mild degenerative joint disease for age. No fracture is seen. The left [facility] joint is normally aligned. The ribs that are visualized appear intact. Subtle nodularity in the left mid upper lung field may be due to chronic indolent infection as described on prior CT of the chest. IMPRESSION: Osteopenia.  No  acute fracture. Electronically Signed   By: Ivar Drape M.D.   On: 02/10/2016 12:51   Dg Hip Operative Unilat W Or W/o Pelvis Left  Result Date: 02/11/2016 CLINICAL DATA:  ORIF left proximal femur fracture EXAM: OPERATIVE LEFT HIP (WITH PELVIS IF PERFORMED) 4 VIEWS TECHNIQUE: Fluoroscopic spot image(s) were submitted for interpretation post-operatively. COMPARISON:  02/10/2016 left hip radiograph FINDINGS: Fluoroscopy time 1 minutes 6 seconds. For spot fluoroscopic intraoperative nondiagnostic radiographs of the left hip demonstrate transfixation of the intertrochanteric left femur fracture with intra medullary rod and interlocking left femoral neck pain and distal interlocking screw. IMPRESSION: Intraoperative fluoroscopic guidance for ORIF left proximal femur fracture. Electronically Signed   By: Ilona Sorrel M.D.   On: 02/11/2016 17:38   Dg Hip Unilat With Pelvis 2-3 Views Left  Result Date: 02/10/2016 CLINICAL DATA:  Status post fall.  Left hip and shoulder pain. EXAM: DG HIP (WITH OR WITHOUT PELVIS) 2-3V LEFT COMPARISON:  None. FINDINGS: Comminuted left intertrochanteric fracture with mild displacement. No right hip fracture. No dislocation. Mild osteoarthritis of bilateral hips. Mild osteoarthritis of bilateral sacroiliac joints. IMPRESSION: Acute, comminuted left intertrochanteric fracture. Electronically Signed   By: Kathreen Devoid   On: 02/10/2016 12:45     CBC  Recent Labs Lab 02/10/16 1251 02/11/16 0445 02/12/16 0539  WBC 25.8* 19.5* 15.7*  HGB 14.0 11.8* 10.5*  HCT 41.5 35.3 31.3*  PLT 368 315 273  MCV 82.6 84.0 84.7  MCH 27.8 28.1 28.4  MCHC 33.7 33.5 33.5  RDW 15.4* 15.7* 15.7*  LYMPHSABS 0.8*  --   --  MONOABS 2.6*  --   --   EOSABS 0.0  --   --   BASOSABS 0.0  --   --     Chemistries   Recent Labs Lab 02/10/16 1037 02/11/16 0445 02/12/16 0539  NA 134* 137 139  K 4.3 3.9 4.0  CL 101 107 109  CO2 24 28 30   GLUCOSE 169* 128* 108*  BUN 26* 31* 26*  CREATININE  0.67 0.80 0.60  CALCIUM 9.3 8.6* 8.0*   ------------------------------------------------------------------------------------------------------------------ estimated creatinine clearance is 50.9 mL/min (by C-G formula based on SCr of 0.8 mg/dL). ------------------------------------------------------------------------------------------------------------------ No results for input(s): HGBA1C in the last 72 hours. ------------------------------------------------------------------------------------------------------------------ No results for input(s): CHOL, HDL, LDLCALC, TRIG, CHOLHDL, LDLDIRECT in the last 72 hours. ------------------------------------------------------------------------------------------------------------------ No results for input(s): TSH, T4TOTAL, T3FREE, THYROIDAB in the last 72 hours.  Invalid input(s): FREET3 ------------------------------------------------------------------------------------------------------------------ No results for input(s): VITAMINB12, FOLATE, FERRITIN, TIBC, IRON, RETICCTPCT in the last 72 hours.  Coagulation profile  Recent Labs Lab 02/10/16 1037 02/11/16 0445 02/12/16 0539  INR 1.40 1.45 1.57    No results for input(s): DDIMER in the last 72 hours.  Cardiac Enzymes No results for input(s): CKMB, TROPONINI, MYOGLOBIN in the last 168 hours.  Invalid input(s): CK ------------------------------------------------------------------------------------------------------------------ Invalid input(s): [place]  Patient is 59 s/p faill with left hip fx  * Left hip fracture S/p hip repair Control pain Pt eval and treatment tomm  * Acute rhabdomyolysis Continue ivf repeat cpk    * Leukocytosis Chest x-ray is negative UA is negative Likely stress-induced repeat CBC in the morning  * Central hypertension Continue inderal  *gerd  Continue protonix  Misc: continue warfarin     Code Status Orders         Start     Ordered   02/10/16 1323  Full code  Continuous     02/10/16 1324    Code Status History    Date Active Date Inactive Code Status Order ID Comments User Context   This patient has a current code status but no historical code status.    Advance Directive Documentation   Flowsheet Row Most Recent Value  Type of Advance Directive  Healthcare Power of Attorney, Living will  Pre-existing out of facility DNR order (yellow form or pink MOST form)  No data  "MOST" Form in Place?  No data           Consults   ortho  DVT Prophylaxis  warfarin  Lab Results  Component Value Date   PLT 273 02/12/2016     Time Spent in minutes   37min  Greater than 50% of time spent in care coordination and counseling patient regarding the condition and plan of care.   Dustin Flock M.D on 02/12/2016 at 1:00 PM  Between 7am to 6pm - Pager - [phone]  After 6pm go to [email or web address] - password [email or web address] [place] [facility]   Office  [phone]

## 2016-02-12 NOTE — Progress Notes (Signed)
PT is recommending SNF. Clinical Social Worker (CSW) met with patient and her daughter Berline Chough to present bed offers. They chose [facility]. Kim admissions coordinator at [facility] is aware of accepted bed offer. CSW will continue to follow and assist as needed.   [employer], LCSW [phone]

## 2016-02-12 NOTE — Progress Notes (Signed)
Physical Therapy Treatment Patient Details Name: [name] MRN: [id number] DOB: [birth year] Today's Date: 02/12/2016    History of Present Illness Pt. was admitted for a  left proximal femur fracture s/p ORIF.    PT Comments    Pt is making good progress towards goals, however is limited secondary to weakness and pain. Pt still very anxious with all mobility, however is able to participate in more there-ex and agreeable to therapy. Still requires heavy assist for mobility. O2 removed for ambulation, however returned to patient once supine.  Follow Up Recommendations  SNF     Equipment Recommendations       Recommendations for Other Services       Precautions / Restrictions Precautions Precautions: Fall Restrictions Weight Bearing Restrictions: Yes LLE Weight Bearing: Weight bearing as tolerated    Mobility  Bed Mobility Overal bed mobility: Needs Assistance;+2 for physical assistance Bed Mobility: Sit to Supine     Supine to sit: Max assist;+2 for physical assistance     General bed mobility comments: Transfers performed with max assist +2. Pt less anxious with mobility, however still limited by pain and needs heavy assist for mobility  Transfers Overall transfer level: Needs assistance Equipment used: Rolling walker (2 wheeled) Transfers: Sit to/from Stand Sit to Stand: Max assist;+2 physical assistance         General transfer comment: transfers performed with heavy cues and tactile cues for upright posture. Therapist blocked B feet prior to standing.  Ambulation/Gait Ambulation/Gait assistance: Mod assist;+2 physical assistance Ambulation Distance (Feet): 3 Feet Assistive device: Rolling walker (2 wheeled) Gait Pattern/deviations: Step-to pattern     General Gait Details: antalgic gait pattern with slow technique. Pt not able to fully pick L LE foot off ground and was able to slide towards bed.   Stairs            Wheelchair Mobility     Modified Rankin (Stroke Patients Only)       Balance                                    Cognition Arousal/Alertness: Awake/alert Behavior During Therapy: Anxious Overall Cognitive Status: Within Functional Limits for tasks assessed                      Exercises Other Exercises Other Exercises: supine ther-ex performed on B LE including ankle pumps, quad sets, hip abd/add, and glut sets. All ther-ex performed x 10 reps with min assist and cues for correct technique. Other Exercises: Transfer to [facility] with +3 for safety/equipment. Max assist for hygiene.     General Comments        Pertinent Vitals/Pain Pain Assessment: Faces Faces Pain Scale: Hurts little more Pain Location: L hip Pain Descriptors / Indicators: Dull;Discomfort;Operative site guarding Pain Intervention(s): Limited activity within patient's tolerance;Ice applied    Home Living Family/patient expects to be discharged to:: Private residence Living Arrangements: Alone Available Help at Discharge: Family Type of Home: Independent living facility Home Access: Level entry   Home Layout: One level Home Equipment: Environmental consultant - 4 wheels;Cane - single point      Prior Function Level of Independence: Independent with assistive device(s)      Comments: Resides at [place] [address].   PT Goals (current goals can now be found in the care plan section) Acute Rehab PT Goals Patient Stated Goal: To get better  PT Goal Formulation: With patient Time For Goal Achievement: 02/26/16 Potential to Achieve Goals: Good Progress towards PT goals: Progressing toward goals    Frequency  BID    PT Plan Current plan remains appropriate    Co-evaluation             End of Session Equipment Utilized During Treatment: Gait belt;Oxygen Activity Tolerance: Patient limited by pain Patient left: in bed;with bed alarm set     Time: 1333-1356 PT Time Calculation (min) (ACUTE ONLY):  23 min  Charges:  $Gait Training: 8-22 mins $Therapeutic Exercise: 8-22 mins                    G Codes:      [name] March 10, 2016, 3:43 PM  [name], PT, DPT [phone]

## 2016-02-12 NOTE — Evaluation (Signed)
Physical Therapy Evaluation Patient Details Name: [name] MRN: [id number] DOB: 12-Jul-1924 Today's Date: 02/12/2016   History of Present Illness  Pt admitted for L proximal femur fracture and is now s/p ORIF. Pt with complaints of initial fall as well as history of multiple falls in the past year. Pt with history of arthritis and breast cancer.  Clinical Impression  Pt is a pleasant 82 year old female who was admitted for L hip fracture and now s/p ORIF. Pt performs bed mobility with max assist +2, transfers with max assist +2, and ambulation with mod assist +2 and rw. Pt very anxious with all mobility and is limited secondary to pain. Pt demonstrates deficits with pain/strength/mobility. Would benefit from skilled PT to address above deficits and promote optimal return to PLOF; recommend transition to STR upon discharge from acute hospitalization.       Follow Up Recommendations SNF    Equipment Recommendations       Recommendations for Other Services       Precautions / Restrictions Precautions Precautions: Fall Restrictions Weight Bearing Restrictions: Yes LLE Weight Bearing: Weight bearing as tolerated      Mobility  Bed Mobility Overal bed mobility: Needs Assistance;+2 for physical assistance Bed Mobility: Supine to Sit     Supine to sit: Max assist;+2 for physical assistance     General bed mobility comments: Pt able to initiate transfer by reaching for railing with B hands. Pt needs heavy assist to perform transfer. Once seated at EOB, needs help scooting out towards EOB and then able to sit with cga. Pt very fearful of movement.  Transfers Overall transfer level: Needs assistance Equipment used: Rolling walker (2 wheeled) Transfers: Sit to/from Stand Sit to Stand: Max assist;+2 physical assistance         General transfer comment: transfers performed with heavy assist and multiple cues for sequencing. Needs therapist to block B feet prior to  standing. Once standing, pt with kyphotic posture, needs assist for upright posture and tactile cues.   Ambulation/Gait Ambulation/Gait assistance: Mod assist;+2 physical assistance Ambulation Distance (Feet): 3 Feet Assistive device: Rolling walker (2 wheeled) Gait Pattern/deviations: Step-to pattern;Shuffle     General Gait Details: antalgic gait pattern with shuffling gait sequencing. Heavy cues for technique. Pt with decreased stance time on L LE secondary to pain. Slow speed over to recliner  Stairs            Wheelchair Mobility    Modified Rankin (Stroke Patients Only)       Balance Overall balance assessment: Needs assistance;History of Falls Sitting-balance support: Feet supported Sitting balance-Leahy Scale: Fair     Standing balance support: Bilateral upper extremity supported Standing balance-Leahy Scale: Poor                               Pertinent Vitals/Pain Pain Assessment: Faces Faces Pain Scale: Hurts even more Pain Location: L hip Pain Descriptors / Indicators: Discomfort;Operative site guarding Pain Intervention(s): Limited activity within patient's tolerance;Patient requesting pain meds-RN notified;RN gave pain meds during session;Ice applied    Home Living Family/patient expects to be discharged to:: Private residence Living Arrangements: Alone (at [place]) Available Help at Discharge: Family Type of Home: Independent living facility Home Access: Level entry     Home Layout: One level Home Equipment: Environmental consultant - 4 wheels;Cane - single point      Prior Function Level of Independence: Independent with assistive device(s)  Comments: ambulated to dining hall every day with rollater     Hand Dominance        Extremity/Trunk Assessment   Upper Extremity Assessment: Generalized weakness (B UE grossly 3+/5)           Lower Extremity Assessment: Generalized weakness (L LE grossly 2/5; R LE grossly 3+/5)          Communication   Communication: No difficulties  Cognition Arousal/Alertness: Awake/alert Behavior During Therapy: Anxious Overall Cognitive Status: Within Functional Limits for tasks assessed                      General Comments      Exercises Other Exercises Other Exercises: Supine ther-ex performed on B LE including ankle pumps, quad sets, and hip abd/add. Pt very fearful of movement, however able to perform 10 reps with mod/max assist.      Assessment/Plan    PT Assessment Patient needs continued PT services  PT Diagnosis Difficulty walking;Generalized weakness;Acute pain   PT Problem List Decreased strength;Decreased balance;Decreased mobility;Decreased knowledge of use of DME;Pain  PT Treatment Interventions Gait training;Therapeutic exercise   PT Goals (Current goals can be found in the Care Plan section) Acute Rehab PT Goals Patient Stated Goal: to get stronger PT Goal Formulation: With patient Time For Goal Achievement: 02/26/16 Potential to Achieve Goals: Good    Frequency BID   Barriers to discharge        Co-evaluation               End of Session Equipment Utilized During Treatment: Gait belt;Oxygen Activity Tolerance: Patient limited by pain Patient left: in chair;with chair alarm set;with family/visitor present Nurse Communication: Mobility status         Time: [id number] PT Time Calculation (min) (ACUTE ONLY): 37 min   Charges:   PT Evaluation $PT Eval Moderate Complexity: 1 Procedure PT Treatments $Therapeutic Exercise: 8-22 mins   PT G Codes:        Brittley Regner 02/29/16, 11:24 AM  [name], PT, DPT [phone]

## 2016-02-12 NOTE — Progress Notes (Addendum)
   Subjective: 1 Day Post-Op Procedure(s) (LRB): INTRAMEDULLARY (IM) NAIL INTERTROCHANTRIC (Left) Patient reports pain as mild.   Patient is well, and has had no acute complaints or problems We will start therapy today.  Plan is to go Rehab after hospital stay. no nausea and no vomiting Patient denies any chest pains or shortness of breath. Pt apprehensive about moving the left leg. Sitting up in bed  Objective: Vital signs in last 24 hours: Temp:  [97.3 F (36.3 C)-98.6 F (37 C)] 98.6 F (37 C) (08/31 0350) Pulse Rate:  [56-83] 58 (08/31 0350) Resp:  [12-23] 19 (08/31 0350) BP: (101-153)/(46-78) 101/52 (08/31 0350) SpO2:  [93 %-100 %] 100 % (08/31 0350) FiO2 (%):  [32 %] 32 % (08/30 1911) Weight:  [76.7 kg (169 lb)] 76.7 kg (169 lb) (08/31 0500) well approximated incision Heels are non tender and elevated off the bed using rolled towels Intake/Output from previous day: 08/30 0701 - 08/31 0700 In: 491.3 [I.V.:491.3] Out: 400 [Urine:350; Blood:50] Intake/Output this shift: No intake/output data recorded.   Recent Labs  02/10/16 1251 02/11/16 0445 02/12/16 0539  HGB 14.0 11.8* 10.5*    Recent Labs  02/11/16 0445 02/12/16 0539  WBC 19.5* 15.7*  RBC 4.21 3.69*  HCT 35.3 31.3*  PLT 315 273    Recent Labs  02/11/16 0445 02/12/16 0539  NA 137 139  K 3.9 4.0  CL 107 109  CO2 28 30  BUN 31* 26*  CREATININE 0.80 0.60  GLUCOSE 128* 108*  CALCIUM 8.6* 8.0*    Recent Labs  02/11/16 0445 02/12/16 0539  INR 1.45 1.57    EXAM General - Patient is Alert, Appropriate and Oriented Extremity - Neurologically intact Neurovascular intact Sensation intact distally Intact pulses distally Dorsiflexion/Plantar flexion intact Dressing - dressing C/D/I Motor Function - intact, moving foot and toes well on exam.    Past Medical History:  Diagnosis Date  . Arthritis   . Cancer (HCC)    BREAST  . Cough    CHRONIC  . Depression   . Dizziness   . DVT (deep  venous thrombosis) ([place])   . Edema    FEET/LEGS  . GERD (gastroesophageal reflux disease)   . Heart murmur   . Heart palpitations   . Hepatitis   . HOH (hard of hearing)   . Hyperlipidemia   . Hypertension   . Osteoporosis   . Shortness of breath dyspnea   . Tremors of nervous system     Assessment/Plan: 1 Day Post-Op Procedure(s) (LRB): INTRAMEDULLARY (IM) NAIL INTERTROCHANTRIC (Left) Active Problems:   Closed left hip fracture (HCC)  Estimated body mass index is 26.47 kg/m as calculated from the following:   Height as of this encounter: 5\' 7"  (1.702 m).   Weight as of this encounter: 76.7 kg (169 lb). Advance diet Up with therapy D/C IV fluids Plan for discharge tomorrow Discharge to SNF  Labs: reviewed Labs in am  DVT Prophylaxis - Coumadin, Foot Pumps and TED hose Weight-Bearing as tolerated to left leg D/C O2 and Pulse OX and try on Room [employer] R. [place] 02/12/2016, 7:27 AM   Addendum: I discussed rehabilitation options with the family. Care management/social work Neurosurgeon for skilled nursing. I restarted the patient's Coumadin postoperatively, and prothrombin times will need to be monitored accordingly.  James P. Holley Bouche M.D.

## 2016-02-12 NOTE — Evaluation (Signed)
Occupational Therapy Evaluation Patient Details Name: BETHAN LUCKE MRN: [id number] DOB: 15-Mar-1925 Today's Date: 02/12/2016    History of Present Illness Pt. was admitted for a  left proximal femur fracture s/p ORIF.   Clinical Impression   Pt. Is an 82 y.o. female who was admitted to [facility] for surgical intervention following a Left Proximal Femur Fracture s/p ORIF. Pt. Presents with pain, limited ROM, weakness, and limited activity tolerance which hinder her ability to complete ADL tasks. Pt. Could benefit from skilled OT services to review adaptive equipment training for LE ADLs, review work simplification techniques, and to improve ADLfunctioning and work towards returning to her PLOF.    Follow Up Recommendations  SNF    Equipment Recommendations       Recommendations for Other Services PT consult     Precautions / Restrictions Precautions Precautions: Fall Restrictions Weight Bearing Restrictions: Yes LLE Weight Bearing: Weight bearing as tolerated      Mobility  Transfers   Balance  Transfers deferred to PT: Per PT Pt. Requires MaxA x 2 For transfers                            ADL Overall ADL's : Needs assistance/impaired Eating/Feeding: Set up   Grooming: Set up               Lower Body Dressing: Maximal assistance                 General ADL Comments: Pt. education was provided about A/E use for LE dressing.     Vision     Perception     Praxis      Pertinent Vitals/Pain Pain Assessment: 0-10 Faces Pain Scale: Hurts little more Pain Location: L hip Pain Descriptors / Indicators: Discomfort;Operative site guarding Pain Intervention(s): Limited activity within patient's tolerance;Patient requesting pain meds-RN notified;RN gave pain meds during session;Ice applied     Hand Dominance Right   Extremity/Trunk Assessment Upper Extremity Assessment Upper Extremity Assessment: Generalized weakness          Communication Communication Communication: No difficulties   Cognition Arousal/Alertness: Awake/alert Behavior During Therapy: Anxious Overall Cognitive Status: Within Functional Limits for tasks assessed                     General Comments       Exercises   Shoulder Instructions      Home Living Family/patient expects to be discharged to:: Private residence Living Arrangements: Alone Available Help at Discharge: Family Type of Home: Independent living facility Home Access: Level entry     Home Layout: One level     Bathroom Shower/Tub: Teacher, early years/pre: Standard     Home Equipment: Environmental consultant - 4 wheels;Cane - single point          Prior Functioning/Environment Level of Independence: Independent with assistive device(s)        Comments: Resides at [address].    OT Diagnosis: Generalized weakness;Acute pain   OT Problem List: Decreased strength;Pain;Decreased safety awareness   OT Treatment/Interventions: Self-care/ADL training;Therapeutic exercise;Therapeutic activities;Energy conservation;Patient/family education;DME and/or AE instruction    OT Goals(Current goals can be found in the care plan section) Acute Rehab OT Goals Patient Stated Goal: To get better OT Goal Formulation: With patient/family Time For Goal Achievement: 02/12/16 Potential to Achieve Goals: Good  OT Frequency: Min 1X/week   Barriers to D/C:  Co-evaluation              End of Session    Activity Tolerance: Patient tolerated treatment well;Patient limited by pain Patient left: in chair;with call bell/phone within reach;with chair alarm set;with family/visitor present   Time: 1020-1050 OT Time Calculation (min): 30 min Charges:  OT General Charges $OT Visit: 1 Procedure OT Evaluation $OT Eval Moderate Complexity: 1 Procedure G-Codes:    Harrel Carina, MS, OTR/L 02/12/2016, 11:59 AM

## 2016-02-12 NOTE — Consult Note (Signed)
Stopped by to check on patient who missed on week postop visit after cataract surgery left eye.  Overall she is doing well with regards to her eyes and vision is fine.  Her fall does not seem to have affected her eyes.  Vision: able to read newspaper medium print, both eyes. Conj is white and quiet and cornea is clear on gross inspection.   Recommended eye drops: 1.  Difluprednate (durezol) solution.  one drop to the left eye 2 times per day   until 1 month postop visit, outpatient visit at [facility] in approx 3 weeks.

## 2016-02-13 ENCOUNTER — Encounter
Admission: RE | Admit: 2016-02-13 | Discharge: 2016-02-13 | Disposition: A | Discharge status: Home / Self Care | Payer: Medicare Other | Source: Ambulatory Visit | Attending: Internal Medicine | Admitting: Internal Medicine

## 2016-02-13 LAB — PROTIME-INR
INR: 1.6
Prothrombin Time: 19.2 seconds — ABNORMAL HIGH (ref 11.4–15.2)

## 2016-02-13 LAB — BASIC METABOLIC PANEL
Anion gap: 6 (ref 5–15)
BUN: 18 mg/dL (ref 6–20)
CHLORIDE: 103 mmol/L (ref 101–111)
CO2: 26 mmol/L (ref 22–32)
CREATININE: 0.55 mg/dL (ref 0.44–1.00)
Calcium: 8.2 mg/dL — ABNORMAL LOW (ref 8.9–10.3)
GFR calc Af Amer: >60 mL/min (ref >60)
GFR calc non Af Amer: >60 mL/min (ref >60)
GLUCOSE: 120 mg/dL — AB (ref 65–99)
Potassium: 3.7 mmol/L (ref 3.5–5.1)
SODIUM: 135 mmol/L (ref 135–145)

## 2016-02-13 LAB — CBC
HCT: 33.3 % — ABNORMAL LOW (ref 35.0–47.0)
Hemoglobin: 11 g/dL — ABNORMAL LOW (ref 12.0–16.0)
MCH: 28.4 pg (ref 26.0–34.0)
MCHC: 33.1 g/dL (ref 32.0–36.0)
MCV: 85.8 fL (ref 80.0–100.0)
PLATELETS: 335 10*3/uL (ref 150–440)
RBC: 3.88 MIL/uL (ref 3.80–5.20)
RDW: 15.6 % — AB (ref 11.5–14.5)
WBC: 13.1 10*3/uL — ABNORMAL HIGH (ref 3.6–11.0)

## 2016-02-13 LAB — CK: Total CK: 277 U/L — ABNORMAL HIGH (ref 38–234)

## 2016-02-13 MED ORDER — FERROUS SULFATE 325 (65 FE) MG PO TABS: 325.0000 mg | ORAL_TABLET | Freq: Two times a day (BID) | ORAL | 3 refills | Status: AC

## 2016-02-13 MED ORDER — OXYCODONE HCL 5 MG PO TABS: 5.0000 mg | ORAL_TABLET | ORAL | 0 refills | Status: DC | PRN

## 2016-02-13 NOTE — Progress Notes (Signed)
   Subjective: 2 Days Post-Op Procedure(s) (LRB): INTRAMEDULLARY (IM) NAIL INTERTROCHANTRIC (Left) Patient reports pain as either a 7 or pt is asleep.   Patient is well, and has had no acute complaints or problems Continue with physical  therapy today.  Plan is to go Rehab after hospital stay. no nausea and no vomiting Patient denies any chest pains or shortness of breath. Objective: Vital signs in last 24 hours: Temp:  [97.5 F (36.4 C)-98.2 F (36.8 C)] 98.2 F (36.8 C) (09/01 0330) Pulse Rate:  [64-73] 73 (09/01 0330) Resp:  [18-19] 19 (09/01 0330) BP: (125-138)/(53-97) 131/53 (09/01 0330) SpO2:  [94 %-98 %] 94 % (09/01 0330) Weight:  [77.6 kg (171 lb)] 77.6 kg (171 lb) (09/01 0444) well approximated incision Heels are non tender and elevated off the bed using rolled towels Intake/Output from previous day: 08/31 0701 - 09/01 0700 In: 170 [P.O.:120; IV Piggyback:50] Out: 550 [Urine:550] Intake/Output this shift: No intake/output data recorded.   Recent Labs  02/10/16 1251 02/11/16 0445 02/12/16 0539 02/13/16 0353  HGB 14.0 11.8* 10.5* 11.0*    Recent Labs  02/12/16 0539 02/13/16 0353  WBC 15.7* 13.1*  RBC 3.69* 3.88  HCT 31.3* 33.3*  PLT 273 335    Recent Labs  02/12/16 0539 02/13/16 0353  NA 139 135  K 4.0 3.7  CL 109 103  CO2 30 26  BUN 26* 18  CREATININE 0.60 0.55  GLUCOSE 108* 120*  CALCIUM 8.0* 8.2*    Recent Labs  02/12/16 0539 02/13/16 0353  INR 1.57 1.60    EXAM General - Patient is Alert, Appropriate and Oriented Extremity - Neurologically intact Neurovascular intact Sensation intact distally Intact pulses distally Dorsiflexion/Plantar flexion intact Dressing - dressing C/D/I Motor Function - intact, moving foot and toes well on exam.    Past Medical History:  Diagnosis Date  . Arthritis   . Cancer (HCC)    BREAST  . Cough    CHRONIC  . Depression   . Dizziness   . DVT (deep venous thrombosis) ([place])   . Edema    FEET/LEGS  . GERD (gastroesophageal reflux disease)   . Heart murmur   . Heart palpitations   . Hepatitis   . HOH (hard of hearing)   . Hyperlipidemia   . Hypertension   . Osteoporosis   . Shortness of breath dyspnea   . Tremors of nervous system     Assessment/Plan: 2 Days Post-Op Procedure(s) (LRB): INTRAMEDULLARY (IM) NAIL INTERTROCHANTRIC (Left) Active Problems:   Closed left hip fracture (HCC)  Estimated body mass index is 26.78 kg/m as calculated from the following:   Height as of this encounter: 5\' 7"  (1.702 m).   Weight as of this encounter: 77.6 kg (171 lb). Up with therapy Discharge to SNF when cleared by medicine  Labs: reviewed. INR 1.6 DVT Prophylaxis - Coumadin, Foot Pumps and TED hose Weight-Bearing as tolerated to left leg Pt will need to f/u in [facility] in 6 weeks Continue coumadin  TED stocking to be worn for 6 weeks. Staples will need to be removed in 2 weeks and apply benzoin and steri strips.  Jillyn Ledger. [place] 02/13/2016, 7:47 AM

## 2016-02-13 NOTE — Clinical Social Work Note (Signed)
Patient to dc to [facility] Room 208 via non-emergent EMS. Daughter, patient and facility aware. CSW will con't to be available for any dc needs.  Santiago Bumpers, MSW, [place]

## 2016-02-13 NOTE — Discharge Instructions (Signed)
°  DIET:  Cardiac diet  DISCHARGE CONDITION:  Stable  ACTIVITY:  Activity as tolerated  OXYGEN:  Home Oxygen: No.   Oxygen Delivery: room air  DISCHARGE LOCATION:  nursing home    ADDITIONAL DISCHARGE INSTRUCTION:   If you experience worsening of your admission symptoms, develop shortness of breath, life threatening emergency, suicidal or homicidal thoughts you must seek medical attention immediately by calling 911 or calling your MD immediately  if symptoms less severe.  You Must read complete instructions/literature along with all the possible adverse reactions/side effects for all the Medicines you take and that have been prescribed to you. Take any new Medicines after you have completely understood and accpet all the possible adverse reactions/side effects.   Please note  You were cared for by a hospitalist during your hospital stay. If you have any questions about your discharge medications or the care you received while you were in the hospital after you are discharged, you can call the unit and asked to speak with the hospitalist on call if the hospitalist that took care of you is not available. Once you are discharged, your primary care physician will handle any further medical issues. Please note that NO REFILLS for any discharge medications will be authorized once you are discharged, as it is imperative that you return to your primary care physician (or establish a relationship with a primary care physician if you do not have one) for your aftercare needs so that they can reassess your need for medications and monitor your lab values.

## 2016-02-13 NOTE — Progress Notes (Signed)
Patient was discharged to [facility] via EMS. Family in room at discharge and took all of patient's belongings. IV removed with cath intact. Eye drops and paperwork sent with EMS. Report called.

## 2016-02-13 NOTE — Progress Notes (Signed)
Physical Therapy Treatment Patient Details Name: [name] MRN: [id number] DOB: 08-12-1924 Today's Date: 02/13/2016    History of Present Illness Pt. was admitted for a  left proximal femur fracture s/p ORIF.    PT Comments    Pt is making good progress towards goals with decreased physical assist required for mobility. Pt less anxious this date, however is more drowsy and takes increased cues for participation. Pt still limited by pain and pain increased with movement. Pt able to tolerate increased there-ex this date. Still requires +2 assist for all movement.   Follow Up Recommendations  SNF     Equipment Recommendations       Recommendations for Other Services       Precautions / Restrictions Precautions Precautions: Fall Restrictions Weight Bearing Restrictions: Yes LLE Weight Bearing: Weight bearing as tolerated    Mobility  Bed Mobility Overal bed mobility: Needs Assistance;+2 for physical assistance Bed Mobility: Supine to Sit     Supine to sit: Max assist;+2 for physical assistance     General bed mobility comments: Pt able to grab handrail to assist with transfer. Heavy assist and cues required for sequencing. Once seated at EOB, pt able to sit with cga.   Transfers Overall transfer level: Needs assistance Equipment used: Rolling walker (2 wheeled) Transfers: Sit to/from Stand Sit to Stand: Mod assist;+2 physical assistance         General transfer comment: transfers performed with decreased assist from previous session. Pt did not require feet to be blocked. Once standing, pt able to demonstrate improved upright posture with decreased cues. Pt fatigues quickly this session and is unable to maintain erect posture consistently.  Ambulation/Gait Ambulation/Gait assistance: +2 physical assistance;Mod assist Ambulation Distance (Feet): 3 Feet Assistive device: Rolling walker (2 wheeled) Gait Pattern/deviations: Step-to pattern;Shuffle     General  Gait Details: antalgic gait pattern noted. Pt able to slide L foot and has increased difficulty stepping with R foot secondary to decreased stance time on L side. Pt with increased pain with all mobility.   Stairs            Wheelchair Mobility    Modified Rankin (Stroke Patients Only)       Balance                                    Cognition Arousal/Alertness:  (sleepy) Behavior During Therapy: Flat affect Overall Cognitive Status: Within Functional Limits for tasks assessed                      Exercises Other Exercises Other Exercises: supine ther-ex performed on B LE including quad sets, SAQ, hip abd/add, and incentive spironmeter. All ther-ex performed x 12 reps with mod/max assist for completion. Cues given for attention to task and correct technique. Increased pain with movement on L LE.    General Comments        Pertinent Vitals/Pain Pain Assessment: Faces Faces Pain Scale: Hurts even more Pain Location: L hip with movement Pain Descriptors / Indicators: Operative site guarding Pain Intervention(s): Limited activity within patient's tolerance;Ice applied;Premedicated before session    Home Living                      Prior Function            PT Goals (current goals can now be found in the care plan  section) Acute Rehab PT Goals Patient Stated Goal: To get better PT Goal Formulation: With patient Time For Goal Achievement: 02/26/16 Potential to Achieve Goals: Good Progress towards PT goals: Progressing toward goals    Frequency  BID    PT Plan Current plan remains appropriate    Co-evaluation             End of Session Equipment Utilized During Treatment: Gait belt Activity Tolerance: Patient limited by pain Patient left: in chair;with chair alarm set;with family/visitor present;with SCD's reapplied     Time: [id number] PT Time Calculation (min) (ACUTE ONLY): 29 min  Charges:  $Gait Training: 8-22  mins $Therapeutic Exercise: 8-22 mins                    G Codes:      Romona Murdy Feb 19, 2016, 9:40 AM  [name], PT, DPT [phone]

## 2016-02-13 NOTE — Care Management Important Message (Signed)
Important Message  Patient Details  Name: [name] MRN: [id number] Date of Birth: 1924-09-02   Medicare Important Message Given:  Yes    Alvie Heidelberg, RN 02/13/2016, 7:56 AM

## 2016-02-13 NOTE — Discharge Summary (Signed)
[name], 82 y.o., DOB May 27, 1925, MRN [id number]. Admission date: 02/10/2016 Discharge Date 02/13/2016 Primary MD SPARKS,JEFFREY D, MD Admitting Physician Hillary Bow, MD  Admission Diagnosis  Hip fracture requiring operative repair, left, closed, initial encounter [facility]) [S72.002A] Non-traumatic rhabdomyolysis [M62.82]  Discharge Diagnosis   Active Problems:   Closed left hip fracture [facility])   History of breast cancer   Chronic depression   GERD   Heart palpitation   Osteoarthritis        Hospital Course  [name]  is a 82 y.o. female with a known history of Cataracts, chronic dizziness presents to the emergency room after she tried to turn quickly in her kitchen and fell onto the floor landing on the left side and hitting her head. Did not lose consciousness.Here in the emergency room patient has been found to have left hip patient was seen by orthopedics taken to the OR. And had intramedullary nail intra-trochanteric repair. Patient did have a WBC that was elevated. No source of infection was identified urinalysis chest x-ray was negative. Patient is doing better pain is under control she is stable to be discharged to rehabilitation.             Consults  orthopedic surgery  Significant Tests:  See full reports for all details    Dg Chest 1 View  Result Date: 02/10/2016 CLINICAL DATA:  [place] yesterday with shoulder and hip pain EXAM: CHEST 1 VIEW COMPARISON:  CT chest of 09/10/2014 and chest x-ray of 10/19/2011 FINDINGS: There is linear opacity in the right mid lung. This opacity likely is chronic although atelectasis cannot be excluded. Vague nodular opacities are present overlying the left anterior second and third ribs possibly related to small lung nodules noted on prior CT of the chest consistent with a chronic indolent infection. No pneumonia or effusion is currently seen. The heart is within normal limits in size. Surgical clips overlie the left axilla.  The bones are osteopenic. IMPRESSION: 1. No definite active process. 2. Linear atelectasis or scarring the right mid lung. 3. Vague nodular opacities overlie the left anterior second and third ribs possibly representing small nodules noted on prior CT of the chest. Electronically Signed   By: Ivar Drape M.D.   On: 02/10/2016 12:48   Ct Head Wo Contrast  Result Date: 02/10/2016 CLINICAL DATA:  Status post fall.  Left hip and left leg pain. EXAM: CT HEAD WITHOUT CONTRAST TECHNIQUE: Contiguous axial images were obtained from the base of the skull through the vertex without intravenous contrast. COMPARISON:  MR brain 09/12/2015 FINDINGS: Brain: No evidence of acute infarction, hemorrhage, extra-axial collection, ventriculomegaly, or mass effect. Generalized cerebral atrophy. Periventricular white matter low attenuation likely secondary to microangiopathy. Vascular: Cerebrovascular atherosclerotic calcifications are noted. Skull: Negative for fracture or focal lesion. Sinuses/Orbits: Visualized portions of the orbits are unremarkable. Visualized portions of the paranasal sinuses and mastoid air cells are unremarkable. Other: None. IMPRESSION: 1. No acute intracranial pathology. 2. Chronic microvascular disease and cerebral atrophy. Electronically Signed   By: Kathreen Devoid   On: 02/10/2016 12:06   Dg Shoulder Left  Result Date: 02/10/2016 CLINICAL DATA:  Recent fall with left shoulder pain EXAM: LEFT SHOULDER - 2+ VIEW COMPARISON:  CT chest of 09/10/2014 FINDINGS: The bones are diffusely osteopenic. The left humeral head is in normal position with only mild degenerative joint disease for age. No fracture is seen. The left [facility] joint is normally aligned. The ribs that are visualized appear intact. Subtle nodularity in  the left mid upper lung field may be due to chronic indolent infection as described on prior CT of the chest. IMPRESSION: Osteopenia.  No acute fracture. Electronically Signed   By: Ivar Drape M.D.    On: 02/10/2016 12:51   Dg Hip Operative Unilat W Or W/o Pelvis Left  Result Date: 02/11/2016 CLINICAL DATA:  ORIF left proximal femur fracture EXAM: OPERATIVE LEFT HIP (WITH PELVIS IF PERFORMED) 4 VIEWS TECHNIQUE: Fluoroscopic spot image(s) were submitted for interpretation post-operatively. COMPARISON:  02/10/2016 left hip radiograph FINDINGS: Fluoroscopy time 1 minutes 6 seconds. For spot fluoroscopic intraoperative nondiagnostic radiographs of the left hip demonstrate transfixation of the intertrochanteric left femur fracture with intra medullary rod and interlocking left femoral neck pain and distal interlocking screw. IMPRESSION: Intraoperative fluoroscopic guidance for ORIF left proximal femur fracture. Electronically Signed   By: Ilona Sorrel M.D.   On: 02/11/2016 17:38   Dg Hip Unilat With Pelvis 2-3 Views Left  Result Date: 02/10/2016 CLINICAL DATA:  Status post fall.  Left hip and shoulder pain. EXAM: DG HIP (WITH OR WITHOUT PELVIS) 2-3V LEFT COMPARISON:  None. FINDINGS: Comminuted left intertrochanteric fracture with mild displacement. No right hip fracture. No dislocation. Mild osteoarthritis of bilateral hips. Mild osteoarthritis of bilateral sacroiliac joints. IMPRESSION: Acute, comminuted left intertrochanteric fracture. Electronically Signed   By: Kathreen Devoid   On: 02/10/2016 12:45       Today   Subjective:   [name]  Feels better no chest pain  Objective:   Blood pressure (!) 122/48, pulse 70, temperature 98.2 F (36.8 C), temperature source Oral, resp. rate 20, height 5\' 7"  (1.702 m), weight 171 lb (77.6 kg), SpO2 93 %.  .  Intake/Output Summary (Last 24 hours) at 02/13/16 1106 Last data filed at 02/12/16 2100  Gross per 24 hour  Intake              170 ml  Output              550 ml  Net             -380 ml    Exam VITAL SIGNS: Blood pressure (!) 122/48, pulse 70, temperature 98.2 F (36.8 C), temperature source Oral, resp. rate 20, height 5\' 7"  (1.702  m), weight 171 lb (77.6 kg), SpO2 93 %.  GENERAL:  82 y.o.-year-old patient lying in the bed with no acute distress.  EYES: Pupils equal, round, reactive to light and accommodation. No scleral icterus. Extraocular muscles intact.  HEENT: Head atraumatic, normocephalic. Oropharynx and nasopharynx clear.  NECK:  Supple, no jugular venous distention. No thyroid enlargement, no tenderness.  LUNGS: Normal breath sounds bilaterally, no wheezing, rales,rhonchi or crepitation. No use of accessory muscles of respiration.  CARDIOVASCULAR: S1, S2 normal. No murmurs, rubs, or gallops.  ABDOMEN: Soft, nontender, nondistended. Bowel sounds present. No organomegaly or mass.  EXTREMITIES: No pedal edema, cyanosis, or clubbing.  NEUROLOGIC: Cranial nerves II through XII are intact. Muscle strength 5/5 in all extremities. Sensation intact. Gait not checked.  PSYCHIATRIC: The patient is alert and oriented x 3.  SKIN: No obvious rash, lesion, or ulcer.   Data Review     CBC w Diff: Lab Results  Component Value Date   WBC 13.1 (H) 02/13/2016   HGB 11.0 (L) 02/13/2016   HGB 13.6 11/22/2012   HGB 12.9 02/19/2010   HCT 33.3 (L) 02/13/2016   HCT 40.7 11/22/2012   HCT 37.6 02/19/2010   PLT 335 02/13/2016   PLT 306  11/22/2012   PLT 271 02/19/2010   LYMPHOPCT 3 02/10/2016   LYMPHOPCT 38.8 11/22/2012   LYMPHOPCT 33.0 02/19/2010   BANDSPCT 0 02/10/2016   MONOPCT 10 02/10/2016   MONOPCT 11.2 11/22/2012   MONOPCT 9.5 02/19/2010   EOSPCT 0 02/10/2016   EOSPCT 3.9 11/22/2012   EOSPCT 4.1 02/19/2010   BASOPCT 0 02/10/2016   BASOPCT 0.9 11/22/2012   BASOPCT 0.6 02/19/2010   CMP: Lab Results  Component Value Date   NA 135 02/13/2016   NA 136 11/22/2012   NA 138 12/10/2009   K 3.7 02/13/2016   K 4.0 11/22/2012   K 4.3 12/10/2009   CL 103 02/13/2016   CL 104 11/22/2012   CL 101 12/10/2009   CO2 26 02/13/2016   CO2 29 11/22/2012   CO2 28 12/10/2009   BUN 18 02/13/2016   BUN 18 11/22/2012   BUN 22  12/10/2009   CREATININE 0.55 02/13/2016   CREATININE 0.65 11/22/2012   CREATININE 0.8 12/10/2009   PROT 6.8 11/22/2012   PROT 6.5 12/10/2009   ALBUMIN 3.2 (L) 11/22/2012   BILITOT 0.4 11/22/2012   BILITOT 0.70 12/10/2009   ALKPHOS 89 11/22/2012   ALKPHOS 81 12/10/2009   AST 28 11/22/2012   AST 27 12/10/2009   ALT 29 11/22/2012   ALT 22 12/10/2009  .  Micro Results Recent Results (from the past 240 hour(s))  Urine culture     Status: None   Collection Time: 02/10/16  5:00 PM  Result Value Ref Range Status   Specimen Description URINE, RANDOM  Final   Special Requests Normal  Final   Culture NO GROWTH Performed at [facility]   Final   Report Status 02/12/2016 FINAL  Final  Surgical PCR screen     Status: None   Collection Time: 02/10/16  5:00 PM  Result Value Ref Range Status   MRSA, PCR NEGATIVE NEGATIVE Final   Staphylococcus aureus NEGATIVE NEGATIVE Final    Comment:        The Xpert SA Assay (FDA approved for NASAL specimens in patients over 11 years of age), is one component of a comprehensive surveillance program.  Test performance has been validated by [facility] for patients greater than or equal to 58 year old. It is not intended to diagnose infection nor to guide or monitor treatment.         Code Status Orders        Start     Ordered   02/10/16 1323  Full code  Continuous     02/10/16 1324    Code Status History    Date Active Date Inactive Code Status Order ID Comments User Context   This patient has a current code status but no historical code status.    Advance Directive Documentation   Flowsheet Row Most Recent Value  Type of Advance Directive  Healthcare Power of Attorney, Living will  Pre-existing out of facility DNR order (yellow form or pink MOST form)  No data  "MOST" Form in Place?  No data           Contact information for follow-up providers    SPARKS,JEFFREY D, MD Follow up in 2 week(s).   Specialty:   Internal Medicine Contact information: [place] [address] [phone]        Dereck Leep, MD Follow up in 2 week(s).   Specialty:  Orthopedic Surgery Contact information: [place]  [place] [address] [phone]            Contact information for after-discharge care    Destination    [facility] .   Specialty:  [place] information: [address] [place] [phone]                  Discharge Medications     Medication List    TAKE these medications   acetaminophen 500 MG tablet Commonly known as:  TYLENOL Take 500 mg by mouth every 6 (six) hours as needed for mild pain.   DUREZOL 0.05 % Emul Generic drug:  Difluprednate Apply 1 drop to eye 4 (four) times daily. LEFT eye   ferrous sulfate 325 (65 FE) MG tablet Take 1 tablet (325 mg total) by mouth 2 (two) times daily with a meal.   moxifloxacin 0.5 % ophthalmic solution Commonly known as:  VIGAMOX Place 1 drop into the left eye 4 (four) times daily.   oxyCODONE 5 MG immediate release tablet Commonly known as:  Oxy IR/ROXICODONE Take 1-2 tablets (5-10 mg total) by mouth every 4 (four) hours as needed for breakthrough pain ((for MODERATE breakthrough pain)).   propranolol ER 60 MG 24 hr capsule Commonly known as:  INDERAL LA Take 60 mg by mouth daily.   warfarin 3 MG tablet Commonly known as:  COUMADIN Take 1 tablet by mouth daily.          Total Time in preparing paper work, data evaluation and todays exam - 35 minutes  Dustin Flock M.D on 02/13/2016 at 11:06 AM  [facility]   Office  [phone]

## 2016-02-18 LAB — PROTIME-INR
INR: 1.69
PROTHROMBIN TIME: 20.1 s — AB (ref 11.4–15.2)

## 2016-02-19 ENCOUNTER — Non-Acute Institutional Stay (SKILLED_NURSING_FACILITY): Payer: Medicare Other | Admitting: Gerontology

## 2016-02-19 DIAGNOSIS — I82503 Chronic embolism and thrombosis of unspecified deep veins of lower extremity, bilateral: Secondary | ICD-10-CM | POA: Diagnosis not present

## 2016-02-19 DIAGNOSIS — R42 Dizziness and giddiness: Secondary | ICD-10-CM | POA: Diagnosis not present

## 2016-02-19 DIAGNOSIS — F419 Anxiety disorder, unspecified: Secondary | ICD-10-CM

## 2016-02-19 DIAGNOSIS — G250 Essential tremor: Secondary | ICD-10-CM

## 2016-02-19 DIAGNOSIS — Z5181 Encounter for therapeutic drug level monitoring: Secondary | ICD-10-CM

## 2016-02-19 DIAGNOSIS — F418 Other specified anxiety disorders: Secondary | ICD-10-CM | POA: Diagnosis not present

## 2016-02-19 DIAGNOSIS — F329 Major depressive disorder, single episode, unspecified: Secondary | ICD-10-CM

## 2016-02-19 DIAGNOSIS — S72002D Fracture of unspecified part of neck of left femur, subsequent encounter for closed fracture with routine healing: Secondary | ICD-10-CM | POA: Diagnosis not present

## 2016-02-19 LAB — CBC WITH DIFFERENTIAL/PLATELET
BASOS PCT: 0 %
Basophils Absolute: 0.1 10*3/uL (ref 0–0.1)
EOS ABS: 0.3 10*3/uL (ref 0–0.7)
Eosinophils Relative: 2 %
HCT: 32.3 % — ABNORMAL LOW (ref 35.0–47.0)
HEMOGLOBIN: 10.8 g/dL — AB (ref 12.0–16.0)
Lymphocytes Relative: 13 %
Lymphs Abs: 2.1 10*3/uL (ref 1.0–3.6)
MCH: 28.3 pg (ref 26.0–34.0)
MCHC: 33.6 g/dL (ref 32.0–36.0)
MCV: 84.3 fL (ref 80.0–100.0)
MONOS PCT: 11 %
Monocytes Absolute: 1.9 10*3/uL — ABNORMAL HIGH (ref 0.2–0.9)
NEUTROS PCT: 74 %
Neutro Abs: 12.3 10*3/uL — ABNORMAL HIGH (ref 1.4–6.5)
PLATELETS: 500 10*3/uL — AB (ref 150–440)
RBC: 3.83 MIL/uL (ref 3.80–5.20)
RDW: 15.7 % — ABNORMAL HIGH (ref 11.5–14.5)
WBC: 16.6 10*3/uL — AB (ref 3.6–11.0)

## 2016-02-19 LAB — TSH: TSH: 2.178 u[IU]/mL (ref 0.350–4.500)

## 2016-02-19 LAB — MAGNESIUM: MAGNESIUM: 2 mg/dL (ref 1.7–2.4)

## 2016-02-19 LAB — PROTIME-INR
INR: 1.71
PROTHROMBIN TIME: 20.3 s — AB (ref 11.4–15.2)

## 2016-02-19 LAB — COMPREHENSIVE METABOLIC PANEL
ALT: 17 U/L (ref 14–54)
ANION GAP: 4 — AB (ref 5–15)
AST: 29 U/L (ref 15–41)
Albumin: 2.4 g/dL — ABNORMAL LOW (ref 3.5–5.0)
Alkaline Phosphatase: 77 U/L (ref 38–126)
BILIRUBIN TOTAL: 0.8 mg/dL (ref 0.3–1.2)
BUN: 13 mg/dL (ref 6–20)
CHLORIDE: 97 mmol/L — AB (ref 101–111)
CO2: 32 mmol/L (ref 22–32)
Calcium: 8.7 mg/dL — ABNORMAL LOW (ref 8.9–10.3)
Creatinine, Ser: 0.56 mg/dL (ref 0.44–1.00)
GFR calc Af Amer: >60 mL/min (ref >60)
Glucose, Bld: 121 mg/dL — ABNORMAL HIGH (ref 65–99)
POTASSIUM: 4.7 mmol/L (ref 3.5–5.1)
Sodium: 133 mmol/L — ABNORMAL LOW (ref 135–145)
TOTAL PROTEIN: 6.1 g/dL — AB (ref 6.5–8.1)

## 2016-02-19 LAB — VITAMIN B12: VITAMIN B 12: 309 pg/mL (ref 180–914)

## 2016-02-19 LAB — FOLATE: FOLATE: 14.5 ng/mL (ref >5.9)

## 2016-02-20 LAB — PROTIME-INR
INR: 1.82
PROTHROMBIN TIME: 21.3 s — AB (ref 11.4–15.2)

## 2016-02-20 LAB — VITAMIN D 25 HYDROXY (VIT D DEFICIENCY, FRACTURES): VIT D 25 HYDROXY: 21.3 ng/mL — AB (ref 30.0–100.0)

## 2016-02-22 DIAGNOSIS — F419 Anxiety disorder, unspecified: Secondary | ICD-10-CM

## 2016-02-22 DIAGNOSIS — I82503 Chronic embolism and thrombosis of unspecified deep veins of lower extremity, bilateral: Secondary | ICD-10-CM | POA: Insufficient documentation

## 2016-02-22 DIAGNOSIS — R42 Dizziness and giddiness: Secondary | ICD-10-CM | POA: Insufficient documentation

## 2016-02-22 DIAGNOSIS — F32A Depression, unspecified: Secondary | ICD-10-CM | POA: Insufficient documentation

## 2016-02-22 DIAGNOSIS — G250 Essential tremor: Secondary | ICD-10-CM | POA: Insufficient documentation

## 2016-02-22 DIAGNOSIS — H8110 Benign paroxysmal vertigo, unspecified ear: Secondary | ICD-10-CM | POA: Insufficient documentation

## 2016-02-22 DIAGNOSIS — F329 Major depressive disorder, single episode, unspecified: Secondary | ICD-10-CM | POA: Insufficient documentation

## 2016-02-22 NOTE — Progress Notes (Signed)
Location:      Place of Service:  SNF (31) Provider:  Toni Arthurs, NP-C  SPARKS,JEFFREY D, MD  Patient Care Team: Idelle Crouch, MD as PCP - General (Internal Medicine)  Extended Emergency Contact Information Primary Emergency Contact: Driver,Laurel M Address: [address]          [place], [place] [address] [place] Phone: [phone] Relation: Daughter  Code Status:  full Goals of care: Advanced Directive information Advanced Directives 02/11/2016  Does patient have an advance directive? Yes  Type of Paramedic of [place];Living will  Does patient want to make changes to advanced directive? -  Copy of advanced directive(s) in chart? Yes     Chief Complaint  Patient presents with  . Acute Visit  . Medication Management    HPI:  Pt is a 82 y.o. female seen today for an acute visit for INR monitoring as well as nursing and therapy concerns about increased anxiety. Patient reports mechanical fall at home in her kitchen. She was cooking and turned quickly to reach for something. She says the room began to spin and she fell. She was able to scoot herself into the bedroom, where sat on the floor, with her shoulder lean up against the corner of her dresser, for 14 hours until a neighbor found her. She was taken to the hospital, and underwent surgery. Now in rehab, she endorses intense fear of Falling Again, being dropped, and overall hurting more. She does have a essential tremor that noticeably worsened when speaking about her experience. She also downplays the intensity of her pain. When directly asked, she reports her pain at worst as an 8 out of 10 and at best a 3 out of 10. She also reports spasms and cramps especially in her thigh. She already has a diagnosis of depression, but is not on any antidepressants. Patient also reports having cataract surgery to her left eye as recently as one month ago and to her right eye one month prior  to that. She reports having sudden onset of hearing loss especially in her left ear. She also reports having sharp, intense headaches on one side of her head constantly over the past several months. However, since the fall, she has not had any headaches. She has had recent head CT's and MRI to evaluate this, which were negative. She sees dr. Sharol Roussel as an ENT Physician. Advised patient to follow up with him regarding her dizziness and sudden hearing loss.  INR today was 1.71, used  for management of Coumadin  for VTE Prophylaxis in the setting of afib/ chronic DVTs. Pt has been complaint with medication regimen and is aware of dietary modifications needed. Pt is aware of importance of continued compliance with medication and testing. Pt has not displayed any adverse effects related to anticoagulant therapy such as unexplained or excessive bleeding, bruising, hematuria, hematemesis, melena. Heart rate is controlled. Vital signs are stable.      Past Medical History:  Diagnosis Date  . Arthritis   . Cancer (HCC)    BREAST  . Cough    CHRONIC  . Depression   . Dizziness   . DVT (deep venous thrombosis) ([place])   . Edema    FEET/LEGS  . GERD (gastroesophageal reflux disease)   . Heart murmur   . Heart palpitations   . Hepatitis   . HOH (hard of hearing)   . Hyperlipidemia   . Hypertension   . Osteoporosis   .  Shortness of breath dyspnea   . Tremors of nervous system    Past Surgical History:  Procedure Laterality Date  . APPENDECTOMY    . BREAST SURGERY     LUMPECTOMY  . CATARACT EXTRACTION W/PHACO Right 12/18/2015   Procedure: CATARACT EXTRACTION PHACO AND INTRAOCULAR LENS PLACEMENT (IOC);  Surgeon: Eulogio Bear, MD;  Location: [facility] ORS;  Service: Ophthalmology;  Laterality: Right;  [place] 2.00 AP% 14.5 CDE 17.61 Fluid Pack lot # [id number]  . CATARACT EXTRACTION W/PHACO Left 02/05/2016   Procedure: CATARACT EXTRACTION PHACO AND INTRAOCULAR LENS PLACEMENT (IOC);  Surgeon: Eulogio Bear, MD;  Location: [facility] ORS;  Service: Ophthalmology;  Laterality: Left;  [place] 01:23 AP% 8.1 CDE 6.83 Fluid  pack lot # [id number]  . CHOLECYSTECTOMY    . EXTERNAL EAR SURGERY    . EYE SURGERY    . HERNIA REPAIR    . INTRAMEDULLARY (IM) NAIL INTERTROCHANTERIC Left 02/11/2016   Procedure: INTRAMEDULLARY (IM) NAIL INTERTROCHANTRIC;  Surgeon: Dereck Leep, MD;  Location: [facility] ORS;  Service: Orthopedics;  Laterality: Left;  . IVC FILTER PLACEMENT ([facility] HX)    . KNEE ARTHROSCOPY    . MASTECTOMY Left   . NOSE SURGERY     CANCER  . TONSILLECTOMY    . TUBAL LIGATION      Allergies  Allergen Reactions  . Morphine And Related Nausea And Vomiting  . Penicillins Other (See Comments)    Unknown Has patient had a PCN reaction causing immediate rash, facial/tongue/throat swelling, SOB or lightheadedness with hypotension: unsure Has patient had a PCN reaction causing severe rash involving mucus membranes or skin necrosis: unsure Has patient had a PCN reaction that required hospitalization unsure Has patient had a PCN reaction occurring within the last 10 years: no If all of the above answers are "NO", then may proceed with Cephalosporin use.      Medication List       Accurate as of 02/19/16 11:59 PM. Always use your most recent med list.          acetaminophen 500 MG tablet Commonly known as:  TYLENOL Take 500 mg by mouth every 6 (six) hours as needed for mild pain.   alendronate 10 MG tablet Commonly known as:  FOSAMAX Take by mouth.   DUREZOL 0.05 % Emul Generic drug:  Difluprednate Apply 1 drop to eye 4 (four) times daily. LEFT eye   ferrous sulfate 325 (65 FE) MG tablet Take 1 tablet (325 mg total) by mouth 2 (two) times daily with a meal.   moxifloxacin 0.5 % ophthalmic solution Commonly known as:  VIGAMOX Place 1 drop into the left eye 4 (four) times daily.   oxyCODONE 5 MG immediate release tablet Commonly known as:  Oxy IR/ROXICODONE Take 1-2 tablets (5-10 mg total)  by mouth every 4 (four) hours as needed for breakthrough pain ((for MODERATE breakthrough pain)).   propranolol ER 60 MG 24 hr capsule Commonly known as:  INDERAL LA Take 60 mg by mouth daily.   Vitamin D3 1000 units Caps Take by mouth.   warfarin 3 MG tablet Commonly known as:  COUMADIN Take 1 tablet by mouth daily.       Review of Systems  Constitutional: Positive for activity change. Negative for appetite change, chills, diaphoresis and fever.  HENT: Negative for congestion, sneezing, sore throat, trouble swallowing and voice change.   Eyes: Negative for pain, redness and visual disturbance.  Respiratory: Negative for apnea, cough, choking, chest tightness, shortness of  breath and wheezing.   Cardiovascular: Positive for leg swelling. Negative for chest pain and palpitations.  Gastrointestinal: Negative for abdominal distention, abdominal pain, constipation, diarrhea and nausea.  Genitourinary: Negative for difficulty urinating, dysuria, frequency and urgency.  Musculoskeletal: Positive for arthralgias (typical arthritis) and joint swelling. Negative for back pain, gait problem and myalgias.  Skin: Positive for wound (Left hip incision). Negative for color change, pallor and rash.  Neurological: Positive for dizziness and tremors. Negative for syncope, speech difficulty, weakness, numbness and headaches.  Psychiatric/Behavioral: Negative for agitation and behavioral problems. The patient is nervous/anxious.   All other systems reviewed and are negative.    There is no immunization history on file for this patient. Pertinent  Health Maintenance Due  Topic Date Due  . PNA vac Low Risk Adult (1 of 2 - PCV13) 01/30/1992  . INFLUENZA VACCINE  01/13/2016  . DEXA SCAN  Completed   No flowsheet data found. Functional Status Survey:    Vitals:   02/19/16 0500  BP: (!) 108/46  Pulse: 75  Resp: (!) 24  Temp: 98 F (36.7 C)  SpO2: 93%   There is no height or weight on file  to calculate BMI. Physical Exam  Constitutional: She is oriented to person, place, and time. Vital signs are normal. She appears well-developed and well-nourished. She is active and cooperative. She does not appear ill. No distress.  HENT:  Head: Normocephalic and atraumatic.  Mouth/Throat: Uvula is midline, oropharynx is clear and moist and mucous membranes are normal. Mucous membranes are not pale, not dry and not cyanotic.  Eyes: Conjunctivae, EOM and lids are normal. Pupils are equal, round, and reactive to light.  Neck: Trachea normal, normal range of motion and full passive range of motion without pain. Neck supple. No JVD present. No tracheal deviation, no edema and no erythema present. No thyromegaly present.  Cardiovascular: Normal rate, regular rhythm, normal heart sounds, intact distal pulses and normal pulses.  Exam reveals no gallop, no distant heart sounds and no friction rub.   No murmur heard. Pulmonary/Chest: Effort normal and breath sounds normal. No accessory muscle usage. No respiratory distress. She has no decreased breath sounds. She has no wheezes. She has no rhonchi. She has no rales. She exhibits no tenderness.  Abdominal: Normal appearance and bowel sounds are normal. She exhibits no distension and no ascites. There is no tenderness.  Musculoskeletal: She exhibits no edema.       Left hip: She exhibits decreased range of motion, decreased strength, tenderness and swelling.  Expected osteoarthritis, stiffness  Neurological: She is alert and oriented to person, place, and time. She has normal strength. She displays tremor.  Skin: Skin is warm and dry. Laceration (left hip- incision) noted. She is not diaphoretic. No cyanosis. No pallor. Nails show no clubbing.  Psychiatric: She has a normal mood and affect. Her speech is normal and behavior is normal. Judgment and thought content normal. Cognition and memory are normal.  Nursing note and vitals reviewed.   Labs  reviewed:  Recent Labs  02/12/16 0539 02/13/16 0353 02/19/16 1600  NA 139 135 133*  K 4.0 3.7 4.7  CL 109 103 97*  CO2 30 26 32  GLUCOSE 108* 120* 121*  BUN 26* 18 13  CREATININE 0.60 0.55 0.56  CALCIUM 8.0* 8.2* 8.7*  MG  --   --  2.0    Recent Labs  02/19/16 1600  AST 29  ALT 17  ALKPHOS 77  BILITOT 0.8  PROT  6.1*  ALBUMIN 2.4*    Recent Labs  02/10/16 1251  02/12/16 0539 02/13/16 0353 02/19/16 1600  WBC 25.8*  < > 15.7* 13.1* 16.6*  NEUTROABS 22.4*  --   --   --  12.3*  HGB 14.0  < > 10.5* 11.0* 10.8*  HCT 41.5  < > 31.3* 33.3* 32.3*  MCV 82.6  < > 84.7 85.8 84.3  PLT 368  < > 273 335 500*  < > = values in this interval not displayed. Lab Results  Component Value Date   TSH 2.178 02/19/2016   No results found for: HGBA1C No results found for: CHOL, HDL, LDLCALC, LDLDIRECT, TRIG, CHOLHDL  Significant Diagnostic Results in last 30 days:  Dg Chest 1 View  Result Date: 02/10/2016 CLINICAL DATA:  [place] yesterday with shoulder and hip pain EXAM: CHEST 1 VIEW COMPARISON:  CT chest of 09/10/2014 and chest x-ray of 10/19/2011 FINDINGS: There is linear opacity in the right mid lung. This opacity likely is chronic although atelectasis cannot be excluded. Vague nodular opacities are present overlying the left anterior second and third ribs possibly related to small lung nodules noted on prior CT of the chest consistent with a chronic indolent infection. No pneumonia or effusion is currently seen. The heart is within normal limits in size. Surgical clips overlie the left axilla. The bones are osteopenic. IMPRESSION: 1. No definite active process. 2. Linear atelectasis or scarring the right mid lung. 3. Vague nodular opacities overlie the left anterior second and third ribs possibly representing small nodules noted on prior CT of the chest. Electronically Signed   By: Ivar Drape M.D.   On: 02/10/2016 12:48   Ct Head Wo Contrast  Result Date: 02/10/2016 CLINICAL DATA:   Status post fall.  Left hip and left leg pain. EXAM: CT HEAD WITHOUT CONTRAST TECHNIQUE: Contiguous axial images were obtained from the base of the skull through the vertex without intravenous contrast. COMPARISON:  MR brain 09/12/2015 FINDINGS: Brain: No evidence of acute infarction, hemorrhage, extra-axial collection, ventriculomegaly, or mass effect. Generalized cerebral atrophy. Periventricular white matter low attenuation likely secondary to microangiopathy. Vascular: Cerebrovascular atherosclerotic calcifications are noted. Skull: Negative for fracture or focal lesion. Sinuses/Orbits: Visualized portions of the orbits are unremarkable. Visualized portions of the paranasal sinuses and mastoid air cells are unremarkable. Other: None. IMPRESSION: 1. No acute intracranial pathology. 2. Chronic microvascular disease and cerebral atrophy. Electronically Signed   By: Kathreen Devoid   On: 02/10/2016 12:06   Dg Shoulder Left  Result Date: 02/10/2016 CLINICAL DATA:  Recent fall with left shoulder pain EXAM: LEFT SHOULDER - 2+ VIEW COMPARISON:  CT chest of 09/10/2014 FINDINGS: The bones are diffusely osteopenic. The left humeral head is in normal position with only mild degenerative joint disease for age. No fracture is seen. The left [facility] joint is normally aligned. The ribs that are visualized appear intact. Subtle nodularity in the left mid upper lung field may be due to chronic indolent infection as described on prior CT of the chest. IMPRESSION: Osteopenia.  No acute fracture. Electronically Signed   By: Ivar Drape M.D.   On: 02/10/2016 12:51   Dg Hip Operative Unilat W Or W/o Pelvis Left  Result Date: 02/11/2016 CLINICAL DATA:  ORIF left proximal femur fracture EXAM: OPERATIVE LEFT HIP (WITH PELVIS IF PERFORMED) 4 VIEWS TECHNIQUE: Fluoroscopic spot image(s) were submitted for interpretation post-operatively. COMPARISON:  02/10/2016 left hip radiograph FINDINGS: Fluoroscopy time 1 minutes 6 seconds. For spot  fluoroscopic intraoperative nondiagnostic  radiographs of the left hip demonstrate transfixation of the intertrochanteric left femur fracture with intra medullary rod and interlocking left femoral neck pain and distal interlocking screw. IMPRESSION: Intraoperative fluoroscopic guidance for ORIF left proximal femur fracture. Electronically Signed   By: Ilona Sorrel M.D.   On: 02/11/2016 17:38   Dg Hip Unilat With Pelvis 2-3 Views Left  Result Date: 02/10/2016 CLINICAL DATA:  Status post fall.  Left hip and shoulder pain. EXAM: DG HIP (WITH OR WITHOUT PELVIS) 2-3V LEFT COMPARISON:  None. FINDINGS: Comminuted left intertrochanteric fracture with mild displacement. No right hip fracture. No dislocation. Mild osteoarthritis of bilateral hips. Mild osteoarthritis of bilateral sacroiliac joints. IMPRESSION: Acute, comminuted left intertrochanteric fracture. Electronically Signed   By: Kathreen Devoid   On: 02/10/2016 12:45    Assessment/Plan 1. Anxiety and depression  Vitamin D3 (cholecalciferol (vitamin d3)) [OTC] capsule; 4,000 unit; oral   cyanocobalamin (vitamin B-12) [OTC] tablet; 1,000 mcg; amt: 2 tablets (2,000 mcg); oral, daily x 14 days, then  cyanocobalamin (vitamin B-12) [OTC] tablet; 1,000 mcg; amt: 1 tablet (1,000 mcg); oral; daily  Venlafaxine capsule,extended release 24hr; 37.5 mg; amt: 1 capsule; oral x 5 days, then  Venlafaxine capsule,extended release 24hr; 75 mg; amt: 1 capsule; oral  2. Essential tremor  Anxiety control; see above  3. Closed left hip fracture, with routine healing, subsequent encounter  acetaminophen [OTC] tablet; 325 mg; amt: 2 tablets (650 mg); oral Pain control QID  oxycodone - Schedule II tablet; 5 mg; amt: 1 tablet; oral QID  oxycodone - Schedule II tablet; 5 mg; amt: 1-2 tabs (5-26m); oral  Methocarbamol tablet; 500 mg; amt: 1 tablet; oral Q 6 hrs prn  Ice to left hip QID and prn  4. Encounter for therapeutic drug monitoring  Coumadin 4 mg PO Q  Day at 1700 for Atrial Fibrillation  Recheck INR 1 week  Monitor for s/s of bleeding, bruising, hematuria, hematemesis, melena  Check INR 3 days after initiation of antibiotic, when applicable.  5. 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 Vertigo  Follow up with ENT  Review CT head, MRI results  Meclizine 12.5 mg po Q 6 hrs prn- vertigo  Family/ staff Communication:   Total Time:  Documentation:  Face to Face:  Family/Phone:   Labs/tests ordered:  Cbc, met c, tsh, b12, vit d, mag+, INR Recheck INR 1 week  Medication list reviewed and assessed for continued appropriateness.  SVikki Ports NP-C [facility] [phone] [place] [id number] Phone (Mon-Fri 8am-5pm):  [phone] Call:  [phone]& follow prompts after 5pm & weekends Office Phone:  [phone] Fax:  [phone]

## 2016-02-23 ENCOUNTER — Non-Acute Institutional Stay (SKILLED_NURSING_FACILITY): Payer: Medicare Other | Admitting: Gerontology

## 2016-02-23 DIAGNOSIS — Z5181 Encounter for therapeutic drug level monitoring: Secondary | ICD-10-CM | POA: Diagnosis not present

## 2016-02-23 DIAGNOSIS — S72002D Fracture of unspecified part of neck of left femur, subsequent encounter for closed fracture with routine healing: Secondary | ICD-10-CM | POA: Diagnosis not present

## 2016-02-23 DIAGNOSIS — G8918 Other acute postprocedural pain: Secondary | ICD-10-CM | POA: Diagnosis not present

## 2016-02-23 DIAGNOSIS — I82503 Chronic embolism and thrombosis of unspecified deep veins of lower extremity, bilateral: Secondary | ICD-10-CM | POA: Diagnosis not present

## 2016-02-23 LAB — PROTIME-INR
INR: 2.86
Prothrombin Time: 30.6 seconds — ABNORMAL HIGH (ref 11.4–15.2)

## 2016-02-23 NOTE — Progress Notes (Signed)
Location:      Place of Service:  SNF (31) Provider:  Toni Arthurs, NP-C  SPARKS,JEFFREY D, MD  Patient Care Team: Idelle Crouch, MD as PCP - General (Internal Medicine)  Extended Emergency Contact Information Primary Emergency Contact: Driver,Laurel M Address: [address]          [place],  [address] [place] Phone: [phone] Relation: Daughter  Code Status:  full Goals of care: Advanced Directive information Advanced Directives 02/11/2016  Does patient have an advance directive? Yes  Type of Paramedic of [place];Living will  Does patient want to make changes to advanced directive? -  Copy of advanced directive(s) in chart? Yes     Chief Complaint  Patient presents with  . Medication Management  . Follow-up    HPI:  Pt is a 82 y.o. female seen today for an acute visit for INR monitoring as well as follow up on pain management.  She downplays the intensity of her pain. When directly asked, she reports her pain as 0 out of 10. She also reports spasms and cramps especially in her thigh.   INR today was 2.86, used  for management of Coumadin  for VTE Prophylaxis in the setting of afib/ chronic DVTs. Pt has been complaint with medication regimen and is aware of dietary modifications needed. Pt is aware of importance of continued compliance with medication and testing. Pt has not displayed any adverse effects related to anticoagulant therapy such as unexplained or excessive bleeding, bruising, hematuria, hematemesis, melena. Heart rate is controlled. Vital signs are stable.      Past Medical History:  Diagnosis Date  . Arthritis   . Cancer (HCC)    BREAST  . Cough    CHRONIC  . Depression   . Dizziness   . DVT (deep venous thrombosis) ([place])   . Edema    FEET/LEGS  . GERD (gastroesophageal reflux disease)   . Heart murmur   . Heart palpitations   . Hepatitis   . HOH (hard of hearing)   . Hyperlipidemia   .  Hypertension   . Osteoporosis   . Shortness of breath dyspnea   . Tremors of nervous system    Past Surgical History:  Procedure Laterality Date  . APPENDECTOMY    . BREAST SURGERY     LUMPECTOMY  . CATARACT EXTRACTION W/PHACO Right 12/18/2015   Procedure: CATARACT EXTRACTION PHACO AND INTRAOCULAR LENS PLACEMENT (IOC);  Surgeon: Eulogio Bear, MD;  Location: [facility] ORS;  Service: Ophthalmology;  Laterality: Right;  [place] 2.00 AP% 14.5 CDE 17.61 Fluid Pack lot # [id number]  . CATARACT EXTRACTION W/PHACO Left 02/05/2016   Procedure: CATARACT EXTRACTION PHACO AND INTRAOCULAR LENS PLACEMENT (IOC);  Surgeon: Eulogio Bear, MD;  Location: [facility] ORS;  Service: Ophthalmology;  Laterality: Left;  [place] 01:23 AP% 8.1 CDE 6.83 Fluid  pack lot # [id number]  . CHOLECYSTECTOMY    . EXTERNAL EAR SURGERY    . EYE SURGERY    . HERNIA REPAIR    . INTRAMEDULLARY (IM) NAIL INTERTROCHANTERIC Left 02/11/2016   Procedure: INTRAMEDULLARY (IM) NAIL INTERTROCHANTRIC;  Surgeon: Dereck Leep, MD;  Location: [facility] ORS;  Service: Orthopedics;  Laterality: Left;  . IVC FILTER PLACEMENT ([facility] HX)    . KNEE ARTHROSCOPY    . MASTECTOMY Left   . NOSE SURGERY     CANCER  . TONSILLECTOMY    . TUBAL LIGATION      Allergies  Allergen Reactions  .  Morphine And Related Nausea And Vomiting  . Penicillins Other (See Comments)    Unknown Has patient had a PCN reaction causing immediate rash, facial/tongue/throat swelling, SOB or lightheadedness with hypotension: unsure Has patient had a PCN reaction causing severe rash involving mucus membranes or skin necrosis: unsure Has patient had a PCN reaction that required hospitalization unsure Has patient had a PCN reaction occurring within the last 10 years: no If all of the above answers are "NO", then may proceed with Cephalosporin use.      Medication List       Accurate as of 02/23/16  5:47 PM. Always use your most recent med list.          acetaminophen 500 MG  tablet Commonly known as:  TYLENOL Take 500 mg by mouth every 6 (six) hours as needed for mild pain.   alendronate 10 MG tablet Commonly known as:  FOSAMAX Take by mouth.   DUREZOL 0.05 % Emul Generic drug:  Difluprednate Apply 1 drop to eye 4 (four) times daily. LEFT eye   ferrous sulfate 325 (65 FE) MG tablet Take 1 tablet (325 mg total) by mouth 2 (two) times daily with a meal.   moxifloxacin 0.5 % ophthalmic solution Commonly known as:  VIGAMOX Place 1 drop into the left eye 4 (four) times daily.   oxyCODONE 5 MG immediate release tablet Commonly known as:  Oxy IR/ROXICODONE Take 1-2 tablets (5-10 mg total) by mouth every 4 (four) hours as needed for breakthrough pain ((for MODERATE breakthrough pain)).   propranolol ER 60 MG 24 hr capsule Commonly known as:  INDERAL LA Take 60 mg by mouth daily.   Vitamin D3 1000 units Caps Take by mouth.   warfarin 3 MG tablet Commonly known as:  COUMADIN Take 1 tablet by mouth daily.       Review of Systems  Constitutional: Positive for activity change. Negative for appetite change, chills, diaphoresis and fever.  HENT: Negative for congestion, sneezing, sore throat, trouble swallowing and voice change.   Eyes: Negative for pain, redness and visual disturbance.  Respiratory: Negative for apnea, cough, choking, chest tightness, shortness of breath and wheezing.   Cardiovascular: Positive for leg swelling. Negative for chest pain and palpitations.  Gastrointestinal: Negative for abdominal distention, abdominal pain, anal bleeding, blood in stool, constipation, diarrhea and nausea.  Genitourinary: Negative for difficulty urinating, dysuria, frequency and urgency.  Musculoskeletal: Positive for arthralgias (typical arthritis) and joint swelling. Negative for back pain, gait problem and myalgias.  Skin: Positive for wound (Left hip incision). Negative for color change, pallor and rash.  Neurological: Positive for tremors. Negative for  syncope, speech difficulty, weakness, numbness and headaches.  Psychiatric/Behavioral: Negative for agitation and behavioral problems. The patient is nervous/anxious.   All other systems reviewed and are negative.    There is no immunization history on file for this patient. Pertinent  Health Maintenance Due  Topic Date Due  . PNA vac Low Risk Adult (1 of 2 - PCV13) 01/30/1992  . INFLUENZA VACCINE  01/13/2016  . DEXA SCAN  Completed   No flowsheet data found. Functional Status Survey:    Vitals:   02/23/16 1746  BP: 115/68  Pulse: 70  Resp: 20  Temp: 98.7 F (37.1 C)  SpO2: 94%  Weight: 154 lb (69.9 kg)   Body mass index is 24.12 kg/m. Physical Exam  Constitutional: She is oriented to person, place, and time. Vital signs are normal. She appears well-developed and well-nourished. She is active  and cooperative. She does not appear ill. No distress.  HENT:  Head: Normocephalic and atraumatic.  Mouth/Throat: Uvula is midline, oropharynx is clear and moist and mucous membranes are normal. Mucous membranes are not pale, not dry and not cyanotic.  Eyes: Conjunctivae, EOM and lids are normal. Pupils are equal, round, and reactive to light.  Neck: Trachea normal, normal range of motion and full passive range of motion without pain. Neck supple. No JVD present. No tracheal deviation, no edema and no erythema present. No thyromegaly present.  Cardiovascular: Normal rate, regular rhythm, normal heart sounds, intact distal pulses and normal pulses.  Exam reveals no gallop, no distant heart sounds and no friction rub.   No murmur heard. Pulmonary/Chest: Effort normal and breath sounds normal. No accessory muscle usage. No respiratory distress. She has no decreased breath sounds. She has no wheezes. She has no rhonchi. She has no rales. She exhibits no tenderness.  Abdominal: Normal appearance and bowel sounds are normal. She exhibits no distension and no ascites. There is no tenderness.    Musculoskeletal: She exhibits edema (L>R 2+L/ 1+R).       Left hip: She exhibits decreased range of motion, decreased strength, tenderness and swelling.  Expected osteoarthritis, stiffness  Neurological: She is alert and oriented to person, place, and time. She has normal strength. She displays tremor.  Skin: Skin is warm and dry. Laceration (left hip- incision) noted. She is not diaphoretic. No cyanosis. No pallor. Nails show no clubbing.  Psychiatric: She has a normal mood and affect. Her speech is normal and behavior is normal. Judgment and thought content normal. Cognition and memory are normal.  Nursing note and vitals reviewed.   Labs reviewed:  Recent Labs  02/12/16 0539 02/13/16 0353 02/19/16 1600  NA 139 135 133*  K 4.0 3.7 4.7  CL 109 103 97*  CO2 30 26 32  GLUCOSE 108* 120* 121*  BUN 26* 18 13  CREATININE 0.60 0.55 0.56  CALCIUM 8.0* 8.2* 8.7*  MG  --   --  2.0    Recent Labs  02/19/16 1600  AST 29  ALT 17  ALKPHOS 77  BILITOT 0.8  PROT 6.1*  ALBUMIN 2.4*    Recent Labs  02/10/16 1251  02/12/16 0539 02/13/16 0353 02/19/16 1600  WBC 25.8*  < > 15.7* 13.1* 16.6*  NEUTROABS 22.4*  --   --   --  12.3*  HGB 14.0  < > 10.5* 11.0* 10.8*  HCT 41.5  < > 31.3* 33.3* 32.3*  MCV 82.6  < > 84.7 85.8 84.3  PLT 368  < > 273 335 500*  < > = values in this interval not displayed. Lab Results  Component Value Date   TSH 2.178 02/19/2016   No results found for: HGBA1C No results found for: CHOL, HDL, LDLCALC, LDLDIRECT, TRIG, CHOLHDL  Significant Diagnostic Results in last 30 days:  Dg Chest 1 View  Result Date: 02/10/2016 CLINICAL DATA:  [place] yesterday with shoulder and hip pain EXAM: CHEST 1 VIEW COMPARISON:  CT chest of 09/10/2014 and chest x-ray of 10/19/2011 FINDINGS: There is linear opacity in the right mid lung. This opacity likely is chronic although atelectasis cannot be excluded. Vague nodular opacities are present overlying the left anterior second  and third ribs possibly related to small lung nodules noted on prior CT of the chest consistent with a chronic indolent infection. No pneumonia or effusion is currently seen. The heart is within normal limits in size. Surgical clips  overlie the left axilla. The bones are osteopenic. IMPRESSION: 1. No definite active process. 2. Linear atelectasis or scarring the right mid lung. 3. Vague nodular opacities overlie the left anterior second and third ribs possibly representing small nodules noted on prior CT of the chest. Electronically Signed   By: Ivar Drape M.D.   On: 02/10/2016 12:48   Ct Head Wo Contrast  Result Date: 02/10/2016 CLINICAL DATA:  Status post fall.  Left hip and left leg pain. EXAM: CT HEAD WITHOUT CONTRAST TECHNIQUE: Contiguous axial images were obtained from the base of the skull through the vertex without intravenous contrast. COMPARISON:  MR brain 09/12/2015 FINDINGS: Brain: No evidence of acute infarction, hemorrhage, extra-axial collection, ventriculomegaly, or mass effect. Generalized cerebral atrophy. Periventricular white matter low attenuation likely secondary to microangiopathy. Vascular: Cerebrovascular atherosclerotic calcifications are noted. Skull: Negative for fracture or focal lesion. Sinuses/Orbits: Visualized portions of the orbits are unremarkable. Visualized portions of the paranasal sinuses and mastoid air cells are unremarkable. Other: None. IMPRESSION: 1. No acute intracranial pathology. 2. Chronic microvascular disease and cerebral atrophy. Electronically Signed   By: Kathreen Devoid   On: 02/10/2016 12:06   Dg Shoulder Left  Result Date: 02/10/2016 CLINICAL DATA:  Recent fall with left shoulder pain EXAM: LEFT SHOULDER - 2+ VIEW COMPARISON:  CT chest of 09/10/2014 FINDINGS: The bones are diffusely osteopenic. The left humeral head is in normal position with only mild degenerative joint disease for age. No fracture is seen. The left [facility] joint is normally aligned. The ribs  that are visualized appear intact. Subtle nodularity in the left mid upper lung field may be due to chronic indolent infection as described on prior CT of the chest. IMPRESSION: Osteopenia.  No acute fracture. Electronically Signed   By: Ivar Drape M.D.   On: 02/10/2016 12:51   Dg Hip Operative Unilat W Or W/o Pelvis Left  Result Date: 02/11/2016 CLINICAL DATA:  ORIF left proximal femur fracture EXAM: OPERATIVE LEFT HIP (WITH PELVIS IF PERFORMED) 4 VIEWS TECHNIQUE: Fluoroscopic spot image(s) were submitted for interpretation post-operatively. COMPARISON:  02/10/2016 left hip radiograph FINDINGS: Fluoroscopy time 1 minutes 6 seconds. For spot fluoroscopic intraoperative nondiagnostic radiographs of the left hip demonstrate transfixation of the intertrochanteric left femur fracture with intra medullary rod and interlocking left femoral neck pain and distal interlocking screw. IMPRESSION: Intraoperative fluoroscopic guidance for ORIF left proximal femur fracture. Electronically Signed   By: Ilona Sorrel M.D.   On: 02/11/2016 17:38   Dg Hip Unilat With Pelvis 2-3 Views Left  Result Date: 02/10/2016 CLINICAL DATA:  Status post fall.  Left hip and shoulder pain. EXAM: DG HIP (WITH OR WITHOUT PELVIS) 2-3V LEFT COMPARISON:  None. FINDINGS: Comminuted left intertrochanteric fracture with mild displacement. No right hip fracture. No dislocation. Mild osteoarthritis of bilateral hips. Mild osteoarthritis of bilateral sacroiliac joints. IMPRESSION: Acute, comminuted left intertrochanteric fracture. Electronically Signed   By: Kathreen Devoid   On: 02/10/2016 12:45    Assessment/Plan  1. Closed left hip fracture, with routine healing, subsequent encounter 2. Pain, acute post-operative  Continue current regimen  acetaminophen [OTC] tablet; 325 mg; amt: 2 tablets (650 mg); oral Pain control QID  oxycodone - Schedule II tablet; 5 mg; amt: 1 tablet; oral QID  oxycodone - Schedule II tablet; 5 mg; amt: 1-2 tabs  (5-10mg ); oral  Methocarbamol tablet; 500 mg; amt: 1 tablet; oral Q 6 hrs prn  Ice to left hip QID and prn  3. Encounter for therapeutic drug monitoring  Coumadin 4 mg PO Q Day at 1700 for Atrial Fibrillation  Recheck INR 1 week  Monitor for s/s of bleeding, bruising, hematuria, hematemesis, melena  Check INR 3 days after initiation of antibiotic, when applicable.  4. 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  Vikki Ports, NP-C Geriatrics [facility] [phone], [place] [address] Cell Phone (Mon-Fri 8am-5pm):  [phone] On Call:  [phone] & follow prompts after 5pm & weekends Office Phone:  [phone] Office Fax:  [phone]

## 2016-02-25 LAB — PROTIME-INR
INR: 2.59
Prothrombin Time: 28.3 seconds — ABNORMAL HIGH (ref 11.4–15.2)

## 2016-02-27 LAB — PROTIME-INR
INR: 2.74
PROTHROMBIN TIME: 29.6 s — AB (ref 11.4–15.2)

## 2016-03-01 LAB — PROTIME-INR
INR: 3.52
Prothrombin Time: 36.1 seconds — ABNORMAL HIGH (ref 11.4–15.2)

## 2016-03-02 ENCOUNTER — Non-Acute Institutional Stay (SKILLED_NURSING_FACILITY): Payer: Medicare Other | Admitting: Gerontology

## 2016-03-02 DIAGNOSIS — I82503 Chronic embolism and thrombosis of unspecified deep veins of lower extremity, bilateral: Secondary | ICD-10-CM

## 2016-03-02 DIAGNOSIS — Z5181 Encounter for therapeutic drug level monitoring: Secondary | ICD-10-CM

## 2016-03-02 DIAGNOSIS — R0989 Other specified symptoms and signs involving the circulatory and respiratory systems: Secondary | ICD-10-CM

## 2016-03-02 DIAGNOSIS — S72002D Fracture of unspecified part of neck of left femur, subsequent encounter for closed fracture with routine healing: Secondary | ICD-10-CM

## 2016-03-02 DIAGNOSIS — G8918 Other acute postprocedural pain: Secondary | ICD-10-CM | POA: Diagnosis not present

## 2016-03-02 NOTE — Progress Notes (Signed)
Location:      Place of Service:  SNF (31) Provider:  Toni Arthurs, NP-C  SPARKS,JEFFREY D, MD  Patient Care Team: Idelle Crouch, MD as PCP - General (Internal Medicine)  Extended Emergency Contact Information Primary Emergency Contact: Driver,Laurel M Address: [address]          [place], [place] [address] [place] Phone: [phone] Relation: Daughter  Code Status:  full Goals of care: Advanced Directive information Advanced Directives 02/11/2016  Does patient have an advance directive? Yes  Type of Paramedic of [place];Living will  Does patient want to make changes to advanced directive? -  Copy of advanced directive(s) in chart? Yes     Chief Complaint  Patient presents with  . Follow-up    HPI:  Pt is a 82 y.o. female seen today for an acute visit for INR monitoring as well as follow up on pain management.  She downplays the intensity of her pain. When directly asked, she reports her pain as 0 out of 10. She also reports spasms and cramps especially in her thigh.   INR today was 2.86, used  for management of Coumadin  for VTE Prophylaxis in the setting of afib/ chronic DVTs. Pt has been complaint with medication regimen and is aware of dietary modifications needed. Pt is aware of importance of continued compliance with medication and testing. Pt has not displayed any adverse effects related to anticoagulant therapy such as unexplained or excessive bleeding, bruising, hematuria, hematemesis, melena. Heart rate is controlled. Vital signs are stable.      Past Medical History:  Diagnosis Date  . Arthritis   . Cancer (HCC)    BREAST  . Cough    CHRONIC  . Depression   . Dizziness   . DVT (deep venous thrombosis) ([place])   . Edema    FEET/LEGS  . GERD (gastroesophageal reflux disease)   . Heart murmur   . Heart palpitations   . Hepatitis   . HOH (hard of hearing)   . Hyperlipidemia   . Hypertension   .  Osteoporosis   . Shortness of breath dyspnea   . Tremors of nervous system    Past Surgical History:  Procedure Laterality Date  . APPENDECTOMY    . BREAST SURGERY     LUMPECTOMY  . CATARACT EXTRACTION W/PHACO Right 12/18/2015   Procedure: CATARACT EXTRACTION PHACO AND INTRAOCULAR LENS PLACEMENT (IOC);  Surgeon: Eulogio Bear, MD;  Location: [facility] ORS;  Service: Ophthalmology;  Laterality: Right;  [place] 2.00 AP% 14.5 CDE 17.61 Fluid Pack lot # [id number]  . CATARACT EXTRACTION W/PHACO Left 02/05/2016   Procedure: CATARACT EXTRACTION PHACO AND INTRAOCULAR LENS PLACEMENT (IOC);  Surgeon: Eulogio Bear, MD;  Location: [facility] ORS;  Service: Ophthalmology;  Laterality: Left;  [place] 01:23 AP% 8.1 CDE 6.83 Fluid  pack lot # [id number]  . CHOLECYSTECTOMY    . EXTERNAL EAR SURGERY    . EYE SURGERY    . HERNIA REPAIR    . INTRAMEDULLARY (IM) NAIL INTERTROCHANTERIC Left 02/11/2016   Procedure: INTRAMEDULLARY (IM) NAIL INTERTROCHANTRIC;  Surgeon: Dereck Leep, MD;  Location: [facility] ORS;  Service: Orthopedics;  Laterality: Left;  . IVC FILTER PLACEMENT ([facility] HX)    . KNEE ARTHROSCOPY    . MASTECTOMY Left   . NOSE SURGERY     CANCER  . TONSILLECTOMY    . TUBAL LIGATION      Allergies  Allergen Reactions  . Morphine And  Related Nausea And Vomiting  . Penicillins Other (See Comments)    Unknown Has patient had a PCN reaction causing immediate rash, facial/tongue/throat swelling, SOB or lightheadedness with hypotension: unsure Has patient had a PCN reaction causing severe rash involving mucus membranes or skin necrosis: unsure Has patient had a PCN reaction that required hospitalization unsure Has patient had a PCN reaction occurring within the last 10 years: no If all of the above answers are "NO", then may proceed with Cephalosporin use.      Medication List       Accurate as of 03/02/16  7:04 PM. Always use your most recent med list.          acetaminophen 500 MG tablet Commonly known  as:  TYLENOL Take 500 mg by mouth every 6 (six) hours as needed for mild pain.   alendronate 10 MG tablet Commonly known as:  FOSAMAX Take by mouth.   DUREZOL 0.05 % Emul Generic drug:  Difluprednate Apply 1 drop to eye 4 (four) times daily. LEFT eye   ferrous sulfate 325 (65 FE) MG tablet Take 1 tablet (325 mg total) by mouth 2 (two) times daily with a meal.   moxifloxacin 0.5 % ophthalmic solution Commonly known as:  VIGAMOX Place 1 drop into the left eye 4 (four) times daily.   oxyCODONE 5 MG immediate release tablet Commonly known as:  Oxy IR/ROXICODONE Take 1-2 tablets (5-10 mg total) by mouth every 4 (four) hours as needed for breakthrough pain ((for MODERATE breakthrough pain)).   propranolol ER 60 MG 24 hr capsule Commonly known as:  INDERAL LA Take 60 mg by mouth daily.   Vitamin D3 1000 units Caps Take by mouth.   warfarin 3 MG tablet Commonly known as:  COUMADIN Take 1 tablet by mouth daily.       Review of Systems  Constitutional: Positive for activity change. Negative for appetite change, chills, diaphoresis and fever.  HENT: Negative for congestion, sneezing, sore throat, trouble swallowing and voice change.   Eyes: Negative for pain, redness and visual disturbance.  Respiratory: Negative for apnea, cough, choking, chest tightness, shortness of breath and wheezing.   Cardiovascular: Positive for leg swelling. Negative for chest pain and palpitations.  Gastrointestinal: Negative for abdominal distention, abdominal pain, anal bleeding, blood in stool, constipation, diarrhea and nausea.  Genitourinary: Negative for difficulty urinating, dysuria, frequency and urgency.  Musculoskeletal: Positive for arthralgias (typical arthritis) and joint swelling. Negative for back pain, gait problem and myalgias.  Skin: Positive for wound (Left hip incision). Negative for color change, pallor and rash.  Neurological: Positive for tremors. Negative for syncope, speech  difficulty, weakness, numbness and headaches.  Psychiatric/Behavioral: Negative for agitation and behavioral problems. The patient is nervous/anxious.   All other systems reviewed and are negative.    There is no immunization history on file for this patient. Pertinent  Health Maintenance Due  Topic Date Due  . PNA vac Low Risk Adult (1 of 2 - PCV13) 01/30/1992  . INFLUENZA VACCINE  01/13/2016  . DEXA SCAN  Completed   No flowsheet data found. Functional Status Survey:    Vitals:   03/02/16 0500  BP: (!) 120/40  Pulse: (!) 58  Resp: 17  Temp: 97.9 F (36.6 C)  SpO2: 98%  Weight: 154 lb 14.4 oz (70.3 kg)   Body mass index is 24.26 kg/m. Physical Exam  Constitutional: She is oriented to person, place, and time. Vital signs are normal. She appears well-developed and well-nourished. She  is active and cooperative. She does not appear ill. No distress.  HENT:  Head: Normocephalic and atraumatic.  Mouth/Throat: Uvula is midline, oropharynx is clear and moist and mucous membranes are normal. Mucous membranes are not pale, not dry and not cyanotic.  Eyes: Conjunctivae, EOM and lids are normal. Pupils are equal, round, and reactive to light.  Neck: Trachea normal, normal range of motion and full passive range of motion without pain. Neck supple. No JVD present. No tracheal deviation, no edema and no erythema present. No thyromegaly present.  Cardiovascular: Normal rate, regular rhythm, normal heart sounds, intact distal pulses and normal pulses.  Exam reveals no gallop, no distant heart sounds and no friction rub.   No murmur heard. Pulmonary/Chest: Effort normal. No accessory muscle usage. No respiratory distress. She has no decreased breath sounds. She has no wheezes. She has no rhonchi. She has rales in the right upper field. She exhibits no tenderness.  Abdominal: Normal appearance and bowel sounds are normal. She exhibits no distension and no ascites. There is no tenderness.    Musculoskeletal: She exhibits edema (L>R 2+L/ 1+R).       Left hip: She exhibits decreased range of motion, decreased strength, tenderness and swelling.  Expected osteoarthritis, stiffness  Neurological: She is alert and oriented to person, place, and time. She has normal strength. She displays tremor.  Skin: Skin is warm and dry. Laceration (left hip- incision) noted. She is not diaphoretic. No cyanosis. No pallor. Nails show no clubbing.  Psychiatric: She has a normal mood and affect. Her speech is normal and behavior is normal. Judgment and thought content normal. Cognition and memory are normal.  Nursing note and vitals reviewed.   Labs reviewed:  Recent Labs  02/12/16 0539 02/13/16 0353 02/19/16 1600  NA 139 135 133*  K 4.0 3.7 4.7  CL 109 103 97*  CO2 30 26 32  GLUCOSE 108* 120* 121*  BUN 26* 18 13  CREATININE 0.60 0.55 0.56  CALCIUM 8.0* 8.2* 8.7*  MG  --   --  2.0    Recent Labs  02/19/16 1600  AST 29  ALT 17  ALKPHOS 77  BILITOT 0.8  PROT 6.1*  ALBUMIN 2.4*    Recent Labs  02/10/16 1251  02/12/16 0539 02/13/16 0353 02/19/16 1600  WBC 25.8*  < > 15.7* 13.1* 16.6*  NEUTROABS 22.4*  --   --   --  12.3*  HGB 14.0  < > 10.5* 11.0* 10.8*  HCT 41.5  < > 31.3* 33.3* 32.3*  MCV 82.6  < > 84.7 85.8 84.3  PLT 368  < > 273 335 500*  < > = values in this interval not displayed. Lab Results  Component Value Date   TSH 2.178 02/19/2016   No results found for: HGBA1C No results found for: CHOL, HDL, LDLCALC, LDLDIRECT, TRIG, CHOLHDL  Significant Diagnostic Results in last 30 days:  Dg Chest 1 View  Result Date: 02/10/2016 CLINICAL DATA:  [place] yesterday with shoulder and hip pain EXAM: CHEST 1 VIEW COMPARISON:  CT chest of 09/10/2014 and chest x-ray of 10/19/2011 FINDINGS: There is linear opacity in the right mid lung. This opacity likely is chronic although atelectasis cannot be excluded. Vague nodular opacities are present overlying the left anterior second  and third ribs possibly related to small lung nodules noted on prior CT of the chest consistent with a chronic indolent infection. No pneumonia or effusion is currently seen. The heart is within normal limits in size.  Surgical clips overlie the left axilla. The bones are osteopenic. IMPRESSION: 1. No definite active process. 2. Linear atelectasis or scarring the right mid lung. 3. Vague nodular opacities overlie the left anterior second and third ribs possibly representing small nodules noted on prior CT of the chest. Electronically Signed   By: Ivar Drape M.D.   On: 02/10/2016 12:48   Ct Head Wo Contrast  Result Date: 02/10/2016 CLINICAL DATA:  Status post fall.  Left hip and left leg pain. EXAM: CT HEAD WITHOUT CONTRAST TECHNIQUE: Contiguous axial images were obtained from the base of the skull through the vertex without intravenous contrast. COMPARISON:  MR brain 09/12/2015 FINDINGS: Brain: No evidence of acute infarction, hemorrhage, extra-axial collection, ventriculomegaly, or mass effect. Generalized cerebral atrophy. Periventricular white matter low attenuation likely secondary to microangiopathy. Vascular: Cerebrovascular atherosclerotic calcifications are noted. Skull: Negative for fracture or focal lesion. Sinuses/Orbits: Visualized portions of the orbits are unremarkable. Visualized portions of the paranasal sinuses and mastoid air cells are unremarkable. Other: None. IMPRESSION: 1. No acute intracranial pathology. 2. Chronic microvascular disease and cerebral atrophy. Electronically Signed   By: Kathreen Devoid   On: 02/10/2016 12:06   Dg Shoulder Left  Result Date: 02/10/2016 CLINICAL DATA:  Recent fall with left shoulder pain EXAM: LEFT SHOULDER - 2+ VIEW COMPARISON:  CT chest of 09/10/2014 FINDINGS: The bones are diffusely osteopenic. The left humeral head is in normal position with only mild degenerative joint disease for age. No fracture is seen. The left [facility] joint is normally aligned. The ribs  that are visualized appear intact. Subtle nodularity in the left mid upper lung field may be due to chronic indolent infection as described on prior CT of the chest. IMPRESSION: Osteopenia.  No acute fracture. Electronically Signed   By: Ivar Drape M.D.   On: 02/10/2016 12:51   Dg Hip Operative Unilat W Or W/o Pelvis Left  Result Date: 02/11/2016 CLINICAL DATA:  ORIF left proximal femur fracture EXAM: OPERATIVE LEFT HIP (WITH PELVIS IF PERFORMED) 4 VIEWS TECHNIQUE: Fluoroscopic spot image(s) were submitted for interpretation post-operatively. COMPARISON:  02/10/2016 left hip radiograph FINDINGS: Fluoroscopy time 1 minutes 6 seconds. For spot fluoroscopic intraoperative nondiagnostic radiographs of the left hip demonstrate transfixation of the intertrochanteric left femur fracture with intra medullary rod and interlocking left femoral neck pain and distal interlocking screw. IMPRESSION: Intraoperative fluoroscopic guidance for ORIF left proximal femur fracture. Electronically Signed   By: Ilona Sorrel M.D.   On: 02/11/2016 17:38   Dg Hip Unilat With Pelvis 2-3 Views Left  Result Date: 02/10/2016 CLINICAL DATA:  Status post fall.  Left hip and shoulder pain. EXAM: DG HIP (WITH OR WITHOUT PELVIS) 2-3V LEFT COMPARISON:  None. FINDINGS: Comminuted left intertrochanteric fracture with mild displacement. No right hip fracture. No dislocation. Mild osteoarthritis of bilateral hips. Mild osteoarthritis of bilateral sacroiliac joints. IMPRESSION: Acute, comminuted left intertrochanteric fracture. Electronically Signed   By: Kathreen Devoid   On: 02/10/2016 12:45    Assessment/Plan  1. Closed left hip fracture, with routine healing, subsequent encounter 2. Pain, acute post-operative  Continue current regimen  acetaminophen [OTC] tablet; 325 mg; amt: 2 tablets (650 mg); oral Pain control QID  oxycodone - Schedule II tablet; 5 mg; amt: 1 tablet; oral QID  oxycodone - Schedule II tablet; 5 mg; amt: 1-2 tabs  (5-10mg ); oral  Methocarbamol tablet; 500 mg; amt: 1 tablet; oral Q 6 hrs prn  Ice to left hip QID and prn  Xray Left Hip to ensure  proper alignment  3. Encounter for therapeutic drug monitoring  Supratherapeutic INR of 3.52  Hold Coumadin x 2 days, then  Coumadin 3.5 mg PO Q Day at 1700 for Atrial Fibrillation  Recheck INR 4 days  Monitor for s/s of bleeding, bruising, hematuria, hematemesis, melena  Check INR 3 days after initiation of antibiotic, when applicable.  4. 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 Chest Crackles  2- view CXR  Incentive Spirometer  Family/ staff Communication:   Total Time:  Documentation:  Face to Face:  Family/Phone:   Labs/tests ordered:  Recheck INR 1 week, Left Hip Xray, CXR  Medication list reviewed and assessed for continued appropriateness.  Vikki Ports, NP-C Geriatrics [facility] [phone] [place] [address], [place] [address] Cell Phone (Mon-Fri 8am-5pm):  [phone] On Call:  [phone] & follow prompts after 5pm & weekends Office Phone:  [phone] Office Fax:  [phone]

## 2016-03-03 LAB — CBC WITH DIFFERENTIAL/PLATELET
BASOS ABS: 0.2 10*3/uL — AB (ref 0–0.1)
BASOS PCT: 2 %
EOS ABS: 0.2 10*3/uL (ref 0–0.7)
Eosinophils Relative: 2 %
HEMATOCRIT: 26.3 % — AB (ref 35.0–47.0)
HEMOGLOBIN: 8.7 g/dL — AB (ref 12.0–16.0)
Lymphocytes Relative: 17 %
Lymphs Abs: 2 10*3/uL (ref 1.0–3.6)
MCH: 28.7 pg (ref 26.0–34.0)
MCHC: 33.2 g/dL (ref 32.0–36.0)
MCV: 86.3 fL (ref 80.0–100.0)
Monocytes Absolute: 1.8 10*3/uL — ABNORMAL HIGH (ref 0.2–0.9)
Monocytes Relative: 15 %
NEUTROS ABS: 7.9 10*3/uL — AB (ref 1.4–6.5)
NEUTROS PCT: 64 %
Platelets: 435 10*3/uL (ref 150–440)
RBC: 3.04 MIL/uL — AB (ref 3.80–5.20)
RDW: 16.4 % — ABNORMAL HIGH (ref 11.5–14.5)
WBC: 12.1 10*3/uL — AB (ref 3.6–11.0)

## 2016-03-03 LAB — COMPREHENSIVE METABOLIC PANEL
ALBUMIN: 2.4 g/dL — AB (ref 3.5–5.0)
ALK PHOS: 156 U/L — AB (ref 38–126)
ALT: 10 U/L — ABNORMAL LOW (ref 14–54)
ANION GAP: 4 — AB (ref 5–15)
AST: 17 U/L (ref 15–41)
BILIRUBIN TOTAL: 0.4 mg/dL (ref 0.3–1.2)
BUN: 12 mg/dL (ref 6–20)
CALCIUM: 8.3 mg/dL — AB (ref 8.9–10.3)
CO2: 29 mmol/L (ref 22–32)
Chloride: 100 mmol/L — ABNORMAL LOW (ref 101–111)
Creatinine, Ser: 0.48 mg/dL (ref 0.44–1.00)
GFR calc Af Amer: >60 mL/min (ref >60)
GFR calc non Af Amer: >60 mL/min (ref >60)
GLUCOSE: 110 mg/dL — AB (ref 65–99)
Potassium: 4.4 mmol/L (ref 3.5–5.1)
SODIUM: 133 mmol/L — AB (ref 135–145)
TOTAL PROTEIN: 5.9 g/dL — AB (ref 6.5–8.1)

## 2016-03-03 LAB — PROTIME-INR
INR: 3.65
Prothrombin Time: 37.2 seconds — ABNORMAL HIGH (ref 11.4–15.2)

## 2016-03-05 ENCOUNTER — Non-Acute Institutional Stay (SKILLED_NURSING_FACILITY): Payer: Medicare Other | Admitting: Gerontology

## 2016-03-05 DIAGNOSIS — G8918 Other acute postprocedural pain: Secondary | ICD-10-CM

## 2016-03-05 LAB — COMPREHENSIVE METABOLIC PANEL
ALT: 10 U/L — AB (ref 14–54)
AST: 20 U/L (ref 15–41)
Albumin: 2.2 g/dL — ABNORMAL LOW (ref 3.5–5.0)
Alkaline Phosphatase: 133 U/L — ABNORMAL HIGH (ref 38–126)
Anion gap: 4 — ABNORMAL LOW (ref 5–15)
BILIRUBIN TOTAL: 0.9 mg/dL (ref 0.3–1.2)
BUN: 16 mg/dL (ref 6–20)
CO2: 29 mmol/L (ref 22–32)
CREATININE: 0.64 mg/dL (ref 0.44–1.00)
Calcium: 8.1 mg/dL — ABNORMAL LOW (ref 8.9–10.3)
Chloride: 98 mmol/L — ABNORMAL LOW (ref 101–111)
Glucose, Bld: 111 mg/dL — ABNORMAL HIGH (ref 65–99)
POTASSIUM: 4.2 mmol/L (ref 3.5–5.1)
Sodium: 131 mmol/L — ABNORMAL LOW (ref 135–145)
TOTAL PROTEIN: 5.7 g/dL — AB (ref 6.5–8.1)

## 2016-03-05 LAB — CBC WITH DIFFERENTIAL/PLATELET
BASOS PCT: 1 %
Basophils Absolute: 0.1 10*3/uL (ref 0–0.1)
EOS ABS: 0.4 10*3/uL (ref 0–0.7)
EOS PCT: 3 %
HCT: 21.1 % — ABNORMAL LOW (ref 35.0–47.0)
HEMOGLOBIN: 7 g/dL — AB (ref 12.0–16.0)
LYMPHS ABS: 2.3 10*3/uL (ref 1.0–3.6)
Lymphocytes Relative: 18 %
MCH: 28.3 pg (ref 26.0–34.0)
MCHC: 33 g/dL (ref 32.0–36.0)
MCV: 85.9 fL (ref 80.0–100.0)
MONO ABS: 1.8 10*3/uL — AB (ref 0.2–0.9)
MONOS PCT: 14 %
Neutro Abs: 8.4 10*3/uL — ABNORMAL HIGH (ref 1.4–6.5)
Neutrophils Relative %: 64 %
PLATELETS: 401 10*3/uL (ref 150–440)
RBC: 2.46 MIL/uL — ABNORMAL LOW (ref 3.80–5.20)
RDW: 16.1 % — AB (ref 11.5–14.5)
WBC: 13.1 10*3/uL — ABNORMAL HIGH (ref 3.6–11.0)

## 2016-03-05 LAB — SEDIMENTATION RATE: Sed Rate: 81 mm/hr — ABNORMAL HIGH (ref 0–30)

## 2016-03-05 LAB — PROTIME-INR
INR: 2.59
PROTHROMBIN TIME: 28.3 s — AB (ref 11.4–15.2)

## 2016-03-06 ENCOUNTER — Emergency Department: Payer: Medicare Other

## 2016-03-06 ENCOUNTER — Encounter: Payer: Self-pay | Admitting: Emergency Medicine

## 2016-03-06 ENCOUNTER — Inpatient Hospital Stay
Admission: EM | Admit: 2016-03-06 | Discharge: 2016-03-10 | DRG: 580 | Disposition: A | Discharge status: Skilled Nursing Facility | Payer: Medicare Other | Attending: Internal Medicine | Admitting: Internal Medicine

## 2016-03-06 DIAGNOSIS — Z86718 Personal history of other venous thrombosis and embolism: Secondary | ICD-10-CM

## 2016-03-06 DIAGNOSIS — Z880 Allergy status to penicillin: Secondary | ICD-10-CM | POA: Diagnosis not present

## 2016-03-06 DIAGNOSIS — T45515A Adverse effect of anticoagulants, initial encounter: Secondary | ICD-10-CM | POA: Diagnosis present

## 2016-03-06 DIAGNOSIS — Z8249 Family history of ischemic heart disease and other diseases of the circulatory system: Secondary | ICD-10-CM | POA: Diagnosis not present

## 2016-03-06 DIAGNOSIS — Z7901 Long term (current) use of anticoagulants: Secondary | ICD-10-CM

## 2016-03-06 DIAGNOSIS — M79605 Pain in left leg: Secondary | ICD-10-CM | POA: Diagnosis present

## 2016-03-06 DIAGNOSIS — F329 Major depressive disorder, single episode, unspecified: Secondary | ICD-10-CM | POA: Diagnosis present

## 2016-03-06 DIAGNOSIS — L7632 Postprocedural hematoma of skin and subcutaneous tissue following other procedure: Secondary | ICD-10-CM | POA: Diagnosis present

## 2016-03-06 DIAGNOSIS — Y838 Other surgical procedures as the cause of abnormal reaction of the patient, or of later complication, without mention of misadventure at the time of the procedure: Secondary | ICD-10-CM | POA: Diagnosis present

## 2016-03-06 DIAGNOSIS — S72002D Fracture of unspecified part of neck of left femur, subsequent encounter for closed fracture with routine healing: Secondary | ICD-10-CM | POA: Diagnosis not present

## 2016-03-06 DIAGNOSIS — R52 Pain, unspecified: Secondary | ICD-10-CM

## 2016-03-06 DIAGNOSIS — G250 Essential tremor: Secondary | ICD-10-CM | POA: Diagnosis present

## 2016-03-06 DIAGNOSIS — I10 Essential (primary) hypertension: Secondary | ICD-10-CM | POA: Diagnosis present

## 2016-03-06 DIAGNOSIS — Z885 Allergy status to narcotic agent status: Secondary | ICD-10-CM

## 2016-03-06 DIAGNOSIS — M810 Age-related osteoporosis without current pathological fracture: Secondary | ICD-10-CM | POA: Diagnosis present

## 2016-03-06 DIAGNOSIS — E871 Hypo-osmolality and hyponatremia: Secondary | ICD-10-CM | POA: Diagnosis present

## 2016-03-06 DIAGNOSIS — R062 Wheezing: Secondary | ICD-10-CM

## 2016-03-06 DIAGNOSIS — L02416 Cutaneous abscess of left lower limb: Secondary | ICD-10-CM | POA: Diagnosis present

## 2016-03-06 DIAGNOSIS — D62 Acute posthemorrhagic anemia: Secondary | ICD-10-CM | POA: Diagnosis not present

## 2016-03-06 DIAGNOSIS — J9811 Atelectasis: Secondary | ICD-10-CM | POA: Diagnosis present

## 2016-03-06 DIAGNOSIS — Z853 Personal history of malignant neoplasm of breast: Secondary | ICD-10-CM | POA: Diagnosis not present

## 2016-03-06 DIAGNOSIS — H9190 Unspecified hearing loss, unspecified ear: Secondary | ICD-10-CM | POA: Diagnosis present

## 2016-03-06 DIAGNOSIS — K219 Gastro-esophageal reflux disease without esophagitis: Secondary | ICD-10-CM | POA: Diagnosis present

## 2016-03-06 DIAGNOSIS — J45909 Unspecified asthma, uncomplicated: Secondary | ICD-10-CM | POA: Diagnosis present

## 2016-03-06 DIAGNOSIS — R262 Difficulty in walking, not elsewhere classified: Secondary | ICD-10-CM

## 2016-03-06 DIAGNOSIS — Z823 Family history of stroke: Secondary | ICD-10-CM | POA: Diagnosis not present

## 2016-03-06 DIAGNOSIS — E785 Hyperlipidemia, unspecified: Secondary | ICD-10-CM | POA: Diagnosis present

## 2016-03-06 DIAGNOSIS — M6281 Muscle weakness (generalized): Secondary | ICD-10-CM

## 2016-03-06 DIAGNOSIS — R609 Edema, unspecified: Secondary | ICD-10-CM

## 2016-03-06 HISTORY — DX: Unspecified asthma, uncomplicated: J45.909

## 2016-03-06 LAB — ABO/RH: ABO/RH(D): A POS

## 2016-03-06 LAB — COMPREHENSIVE METABOLIC PANEL
ALT: 11 U/L — ABNORMAL LOW (ref 14–54)
ANION GAP: 6 (ref 5–15)
AST: 29 U/L (ref 15–41)
Albumin: 2.2 g/dL — ABNORMAL LOW (ref 3.5–5.0)
Alkaline Phosphatase: 135 U/L — ABNORMAL HIGH (ref 38–126)
BILIRUBIN TOTAL: 1.2 mg/dL (ref 0.3–1.2)
BUN: 13 mg/dL (ref 6–20)
CO2: 26 mmol/L (ref 22–32)
Calcium: 8.1 mg/dL — ABNORMAL LOW (ref 8.9–10.3)
Chloride: 99 mmol/L — ABNORMAL LOW (ref 101–111)
Creatinine, Ser: 0.68 mg/dL (ref 0.44–1.00)
GFR calc Af Amer: >60 mL/min (ref >60)
Glucose, Bld: 109 mg/dL — ABNORMAL HIGH (ref 65–99)
POTASSIUM: 4.6 mmol/L (ref 3.5–5.1)
Sodium: 131 mmol/L — ABNORMAL LOW (ref 135–145)
TOTAL PROTEIN: 5.9 g/dL — AB (ref 6.5–8.1)

## 2016-03-06 LAB — CBC WITH DIFFERENTIAL/PLATELET
BASOS ABS: 0.1 10*3/uL (ref 0–0.1)
Basophils Relative: 1 %
EOS PCT: 2 %
Eosinophils Absolute: 0.2 10*3/uL (ref 0–0.7)
HEMATOCRIT: 21.8 % — AB (ref 35.0–47.0)
Hemoglobin: 7.4 g/dL — ABNORMAL LOW (ref 12.0–16.0)
LYMPHS PCT: 15 %
Lymphs Abs: 2.3 10*3/uL (ref 1.0–3.6)
MCH: 29.1 pg (ref 26.0–34.0)
MCHC: 34.1 g/dL (ref 32.0–36.0)
MCV: 85.3 fL (ref 80.0–100.0)
Monocytes Absolute: 1.9 10*3/uL — ABNORMAL HIGH (ref 0.2–0.9)
Monocytes Relative: 12 %
NEUTROS ABS: 10.9 10*3/uL — AB (ref 1.4–6.5)
NEUTROS PCT: 70 %
PLATELETS: 468 10*3/uL — AB (ref 150–440)
RBC: 2.55 MIL/uL — AB (ref 3.80–5.20)
RDW: 16.4 % — ABNORMAL HIGH (ref 11.5–14.5)
WBC: 15.5 10*3/uL — AB (ref 3.6–11.0)

## 2016-03-06 LAB — URINALYSIS COMPLETE WITH MICROSCOPIC (ARMC ONLY)
BILIRUBIN URINE: NEGATIVE
Glucose, UA: NEGATIVE mg/dL
HGB URINE DIPSTICK: NEGATIVE
Ketones, ur: NEGATIVE mg/dL
LEUKOCYTES UA: NEGATIVE
Nitrite: NEGATIVE
PH: 6 (ref 5.0–8.0)
Protein, ur: NEGATIVE mg/dL
Specific Gravity, Urine: 1.01 (ref 1.005–1.030)

## 2016-03-06 LAB — PREPARE RBC (CROSSMATCH)

## 2016-03-06 LAB — C-REACTIVE PROTEIN: CRP: 19.3 mg/dL — AB (ref <1.0)

## 2016-03-06 MED ORDER — HYDROMORPHONE HCL 1 MG/ML IJ SOLN
0.5000 mg | INTRAMUSCULAR | Status: DC | PRN
  Administered 2016-03-06: 0.5 mg via INTRAVENOUS
  Filled 2016-03-06: qty 1

## 2016-03-06 MED ORDER — ONDANSETRON HCL 4 MG/2ML IJ SOLN
4.0000 mg | Freq: Four times a day (QID) | INTRAMUSCULAR | Status: DC | PRN
  Administered 2016-03-06: 4 mg via INTRAVENOUS
  Filled 2016-03-06: qty 2

## 2016-03-06 MED ORDER — DOCUSATE SODIUM 100 MG PO CAPS
100.0000 mg | ORAL_CAPSULE | Freq: Two times a day (BID) | ORAL | Status: DC
  Administered 2016-03-06 – 2016-03-10 (×7): 100 mg via ORAL
  Filled 2016-03-06 (×7): qty 1

## 2016-03-06 MED ORDER — ZOLPIDEM TARTRATE 5 MG PO TABS: 5.0000 mg | ORAL_TABLET | Freq: Every evening | ORAL | Status: DC | PRN

## 2016-03-06 MED ORDER — MECLIZINE HCL 25 MG PO TABS: 12.5000 mg | ORAL_TABLET | Freq: Four times a day (QID) | ORAL | Status: DC | PRN

## 2016-03-06 MED ORDER — ACETAMINOPHEN 650 MG RE SUPP: 650.0000 mg | Freq: Four times a day (QID) | RECTAL | Status: DC | PRN

## 2016-03-06 MED ORDER — ACETAMINOPHEN 325 MG PO TABS
650.0000 mg | ORAL_TABLET | Freq: Four times a day (QID) | ORAL | Status: DC | PRN
  Administered 2016-03-07 – 2016-03-10 (×2): 650 mg via ORAL
  Filled 2016-03-06 (×2): qty 2

## 2016-03-06 MED ORDER — VITAMIN D 1000 UNITS PO TABS
4000.0000 [IU] | ORAL_TABLET | Freq: Every day | ORAL | Status: DC
  Administered 2016-03-08 – 2016-03-10 (×3): 4000 [IU] via ORAL
  Filled 2016-03-06 (×3): qty 4

## 2016-03-06 MED ORDER — PROPRANOLOL HCL ER 60 MG PO CP24
60.0000 mg | ORAL_CAPSULE | Freq: Every day | ORAL | Status: DC
  Administered 2016-03-07 – 2016-03-10 (×4): 60 mg via ORAL
  Filled 2016-03-06 (×5): qty 1

## 2016-03-06 MED ORDER — VITAMIN B-12 1000 MCG PO TABS
1000.0000 ug | ORAL_TABLET | Freq: Every day | ORAL | Status: DC
  Administered 2016-03-08 – 2016-03-10 (×3): 1000 ug via ORAL
  Filled 2016-03-06 (×3): qty 1

## 2016-03-06 MED ORDER — IOPAMIDOL (ISOVUE-300) INJECTION 61%
100.0000 mL | Freq: Once | INTRAVENOUS | Status: AC | PRN
  Administered 2016-03-06: 100 mL via INTRAVENOUS

## 2016-03-06 MED ORDER — ONDANSETRON HCL 4 MG PO TABS: 4.0000 mg | ORAL_TABLET | Freq: Four times a day (QID) | ORAL | Status: DC | PRN

## 2016-03-06 MED ORDER — FERROUS SULFATE 325 (65 FE) MG PO TABS
325.0000 mg | ORAL_TABLET | Freq: Two times a day (BID) | ORAL | Status: DC
  Filled 2016-03-06: qty 1

## 2016-03-06 MED ORDER — VENLAFAXINE HCL ER 75 MG PO CP24
75.0000 mg | ORAL_CAPSULE | Freq: Every day | ORAL | Status: DC
  Administered 2016-03-08 – 2016-03-10 (×3): 75 mg via ORAL
  Filled 2016-03-06 (×3): qty 1

## 2016-03-06 MED ORDER — OXYCODONE HCL 5 MG PO TABS
5.0000 mg | ORAL_TABLET | ORAL | Status: DC | PRN
  Administered 2016-03-06 – 2016-03-08 (×2): 5 mg via ORAL
  Filled 2016-03-06 (×5): qty 1
  Filled 2016-03-06: qty 2

## 2016-03-06 MED ORDER — OXYCODONE HCL 5 MG PO TABS
5.0000 mg | ORAL_TABLET | ORAL | Status: DC | PRN
  Administered 2016-03-07 – 2016-03-08 (×3): 5 mg via ORAL

## 2016-03-06 MED ORDER — METHOCARBAMOL 500 MG PO TABS: 500.0000 mg | ORAL_TABLET | Freq: Four times a day (QID) | ORAL | Status: DC | PRN

## 2016-03-06 MED ORDER — MOXIFLOXACIN HCL 0.5 % OP SOLN
1.0000 [drp] | Freq: Four times a day (QID) | OPHTHALMIC | Status: DC
  Administered 2016-03-06 – 2016-03-10 (×12): 1 [drp] via OPHTHALMIC
  Filled 2016-03-06 (×2): qty 3

## 2016-03-06 MED ORDER — SODIUM CHLORIDE 0.9 % IV SOLN
Freq: Once | INTRAVENOUS | Status: AC
  Administered 2016-03-06: 20:00:00 via INTRAVENOUS

## 2016-03-06 MED ORDER — DIPHENHYDRAMINE HCL 12.5 MG/5ML PO ELIX: 12.5000 mg | ORAL_SOLUTION | ORAL | Status: DC | PRN

## 2016-03-06 MED ORDER — MIRTAZAPINE 15 MG PO TABS
7.5000 mg | ORAL_TABLET | Freq: Every day | ORAL | Status: DC
  Administered 2016-03-06 – 2016-03-09 (×4): 7.5 mg via ORAL
  Filled 2016-03-06 (×4): qty 1

## 2016-03-06 NOTE — ED Notes (Signed)
Teresa Lose, rn notified of increase in temperature. Blood transfusion paused at 2034 due to increased temp. Pt appears in no acute distress.

## 2016-03-06 NOTE — ED Triage Notes (Signed)
Pt sent over from [facility] place for ct scan of left leg and pt is in need of blood transfusion.  NAD, skin warm and dry.

## 2016-03-06 NOTE — Consult Note (Signed)
[facility] NAME: [name]    MR#:  [id number]  DATE OF BIRTH:  11/05/1924  DATE OF CONSULT:  03/06/2016  PRIMARY CARE PHYSICIAN: Idelle Crouch, MD   REQUESTING/REFERRING PHYSICIAN: Dr. Hessie Knows  CHIEF COMPLAINT:   Chief Complaint  Patient presents with  . Anemia  . Leg Pain    HISTORY OF PRESENT ILLNESS:  [name]  is a 82 y.o. 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 Patient was just discharged to a skilled nursing facility a few weeks back after having hip surgery and over the past few days has been complaining of left hip pain. She was not able to work physical therapy and her left hip with somewhat swollen and she was empirically started on Levaquin. She was sent to the ER and a CT scan of the left hip was consistent with a hematoma and therefore patient is being admitted under the orthopedic service for evacuation of the hematoma and hospitalist services were contacted for preoperative medical evaluation. Patient presently denies any chest pain, shortness of breath, nausea, vomiting, abdominal pain or any other associated symptoms presently.  PAST MEDICAL HISTORY:   Past Medical History:  Diagnosis Date  . Arthritis   . Asthma   . Cancer (HCC)    BREAST  . Cough    CHRONIC  . Depression   . Dizziness   . DVT (deep venous thrombosis) ([place])   . Edema    FEET/LEGS  . GERD (gastroesophageal reflux disease)   . Heart murmur   . Heart palpitations   . Hepatitis   . HOH (hard of hearing)   . Hyperlipidemia   . Hypertension   . Osteoporosis   . Shortness of breath dyspnea   . Tremors of nervous system     PAST SURGICAL HISTOIRY:   Past Surgical History:  Procedure Laterality Date  .  APPENDECTOMY    . BREAST SURGERY     LUMPECTOMY  . CATARACT EXTRACTION W/PHACO Right 12/18/2015   Procedure: CATARACT EXTRACTION PHACO AND INTRAOCULAR LENS PLACEMENT (IOC);  Surgeon: Eulogio Bear, MD;  Location: [facility] ORS;  Service: Ophthalmology;  Laterality: Right;  [place] 2.00 AP% 14.5 CDE 17.61 Fluid Pack lot # [id number]  . CATARACT EXTRACTION W/PHACO Left 02/05/2016   Procedure: CATARACT EXTRACTION PHACO AND INTRAOCULAR LENS PLACEMENT (IOC);  Surgeon: Eulogio Bear, MD;  Location: [facility] ORS;  Service: Ophthalmology;  Laterality: Left;  [place] 01:23 AP% 8.1 CDE 6.83 Fluid  pack lot # [id number]  . CHOLECYSTECTOMY    . EXTERNAL EAR SURGERY    . EYE SURGERY    . HERNIA REPAIR    . INTRAMEDULLARY (IM) NAIL INTERTROCHANTERIC Left 02/11/2016   Procedure: INTRAMEDULLARY (IM) NAIL INTERTROCHANTRIC;  Surgeon: Dereck Leep, MD;  Location: [facility] ORS;  Service: Orthopedics;  Laterality: Left;  . IVC FILTER PLACEMENT ([facility] HX)    . KNEE ARTHROSCOPY    . MASTECTOMY Left   . NOSE SURGERY     CANCER  . TONSILLECTOMY    . TUBAL LIGATION      SOCIAL HISTORY:   Social History  Substance Use Topics  . Smoking status: Never Smoker  . Smokeless tobacco: Never Used  . Alcohol use Yes     Comment: [place]  FAMILY HISTORY:   Family History  Problem Relation Age of Onset  . CAD Mother   . Stroke Father     DRUG ALLERGIES:   Allergies  Allergen Reactions  . Morphine And Related Nausea And Vomiting  . Penicillins Other (See Comments)    Unknown Has patient had a PCN reaction causing immediate rash, facial/tongue/throat swelling, SOB or lightheadedness with hypotension: unsure Has patient had a PCN reaction causing severe rash involving mucus membranes or skin necrosis: unsure Has patient had a PCN reaction that required hospitalization unsure Has patient had a PCN reaction occurring within the last 10 years: no If all of the above answers are "NO", then may proceed with Cephalosporin use.     REVIEW OF SYSTEMS:   Review of Systems  Constitutional: Negative for fever and weight loss.  HENT: Negative for congestion, nosebleeds and tinnitus.   Eyes: Negative for blurred vision, double vision and redness.  Respiratory: Negative for cough, hemoptysis and shortness of breath.   Cardiovascular: Negative for chest pain, orthopnea, leg swelling and PND.  Gastrointestinal: Negative for abdominal pain, diarrhea, melena, nausea and vomiting.  Genitourinary: Negative for dysuria, hematuria and urgency.  Musculoskeletal: Positive for joint pain (Left Hip). Negative for falls.  Neurological: Positive for weakness (Generalized). Negative for dizziness, tingling, sensory change, focal weakness, seizures and headaches.  Endo/Heme/Allergies: Negative for polydipsia. Does not bruise/bleed easily.  Psychiatric/Behavioral: Negative for depression and memory loss. The patient is not nervous/anxious.      MEDICATIONS AT HOME:   Prior to Admission medications   Medication Sig Start Date End Date Taking? Authorizing Provider  acetaminophen (TYLENOL) 500 MG tablet Take 500 mg by mouth every 6 (six) hours as needed for mild pain.   Yes Historical Provider, MD  Cholecalciferol (VITAMIN D3) 1000 units CAPS Take 4 capsules by mouth daily.    Yes Historical Provider, MD  Difluprednate (DUREZOL) 0.05 % EMUL Apply 1 drop to eye 2 (two) times daily. LEFT eye    Yes Historical Provider, MD  ENSURE (ENSURE) Take 237 mLs by mouth.   Yes Historical Provider, MD  ferrous sulfate 325 (65 FE) MG tablet Take 1 tablet (325 mg total) by mouth 2 (two) times daily with a meal. 02/13/16  Yes Dustin Flock, MD  LEVOFLOXACIN IV Inject 750 mg into the vein daily. For cellulitis   Yes Historical Provider, MD  meclizine (ANTIVERT) 12.5 MG tablet Take 12.5 mg by mouth every 6 (six) hours as needed for dizziness.   Yes Historical Provider, MD  methocarbamol (ROBAXIN) 500 MG tablet Take 500 mg by mouth every 6 (six) hours as  needed for muscle spasms.   Yes Historical Provider, MD  mirtazapine (REMERON) 7.5 MG tablet Take 7.5 mg by mouth at bedtime.   Yes Historical Provider, MD  oxyCODONE (OXY IR/ROXICODONE) 5 MG immediate release tablet Take 1-2 tablets (5-10 mg total) by mouth every 4 (four) hours as needed for breakthrough pain ((for MODERATE breakthrough pain)). 02/13/16  Yes Dustin Flock, MD  propranolol ER (INDERAL LA) 60 MG 24 hr capsule Take 60 mg by mouth daily.   Yes Historical Provider, MD  sennosides-docusate sodium (SENOKOT-S) 8.6-50 MG tablet Take 1 tablet by mouth 2 (two) times daily.   Yes Historical Provider, MD  venlafaxine XR (EFFEXOR-XR) 75 MG 24 hr capsule Take 75 mg by mouth daily with breakfast.   Yes Historical Provider, MD  vitamin B-12 (CYANOCOBALAMIN) 1000 MCG tablet Take 1,000 mcg by mouth daily.   Yes Historical Provider, MD  warfarin (COUMADIN) 1 MG tablet Take 0.5 mg by mouth daily.  10/06/15  Yes Historical Provider, MD  warfarin (COUMADIN) 3 MG tablet Take 3 mg by mouth daily.   Yes Historical Provider, MD  moxifloxacin (VIGAMOX) 0.5 % ophthalmic solution Place 1 drop into the left eye 4 (four) times daily.    Historical Provider, MD      VITAL SIGNS:  Blood pressure (!) 166/64, pulse 75, temperature 97.9 F (36.6 C), temperature source Oral, resp. rate 17, SpO2 95 %.  PHYSICAL EXAMINATION:  GENERAL:  82 y.o.-year-old patient lying in the bed with in acute distress.  EYES: Pupils equal, round, reactive to light and accommodation. No scleral icterus. Extraocular muscles intact.  HEENT: Head atraumatic, normocephalic. Oropharynx and nasopharynx clear.  NECK:  Supple, no jugular venous distention. No thyroid enlargement, no tenderness.  LUNGS: Normal breath sounds bilaterally, no wheezing, rales, rhonchi . No use of accessory muscles of respiration.  CARDIOVASCULAR: S1, S2, RRR. No murmurs, rubs, gallops, clicks.  ABDOMEN: Soft, nontender, nondistended. Bowel sounds present. No  organomegaly or mass.  EXTREMITIES: No pedal edema, cyanosis, or clubbing.  Left Hip swelling. Surgical scar stable with no drainage noted. NEUROLOGIC: Cranial nerves II through XII are intact. No focal motor or sensory deficits appreciated bilaterally  PSYCHIATRIC: The patient is alert and oriented x 3. Good affect SKIN: No obvious rash, lesion, or ulcer.   LABORATORY PANEL:   CBC  Recent Labs Lab 03/06/16 1120  WBC 15.5*  HGB 7.4*  HCT 21.8*  PLT 468*   ------------------------------------------------------------------------------------------------------------------  Chemistries   Recent Labs Lab 03/06/16 1120  NA 131*  K 4.6  CL 99*  CO2 26  GLUCOSE 109*  BUN 13  CREATININE 0.68  CALCIUM 8.1*  AST 29  ALT 11*  ALKPHOS 135*  BILITOT 1.2   ------------------------------------------------------------------------------------------------------------------  Cardiac Enzymes No results for input(s): TROPONINI in the last 168 hours. ------------------------------------------------------------------------------------------------------------------  RADIOLOGY:  US Pelvis Complete  Result Date: 03/06/2016 CLINICAL DATA:  Left-sided ovarian cystic lesion on CT. EXAM: TRANSABDOMINAL ULTRASOUND OF PELVIS TECHNIQUE: Transabdominal ultrasound examination of the pelvis was performed including evaluation of the uterus, ovaries, adnexal regions, and pelvic cul-de-sac. COMPARISON:  CT of earlier today. FINDINGS: Uterus Measurements: 7.5 x 1.4 x 2.7 cm. Normal in morphology for age. Endometrium Thickness: On the order of 6-7 mm. No focal abnormality. Trace fluid is suspected within the endometrium of the uterine fundus on image 26. Right ovary Not visualized. A simple appearing cystic lesion is identified within the right adnexa and measures 4.6 x 3.7 x 3.7 cm. Left ovary Not visualized. No left-sided ovarian or adnexal cystic lesion is seen to correspond to the CT abnormality. Other  findings:  No abnormal free fluid. IMPRESSION: 1. Exam performed transabdominally only at patient preference. 2. The left pelvic cystic lesion is not identified. This is simple in appearance on noncontrast CT. 3. Simple appearing right adnexal lesion is also identified measuring maximally 4.6 cm. Question peritoneal inclusion cyst or less likely ovarian cyst. Per consensus criteria, this warrants follow-up with pelvic ultrasound at 1 year. On this exam, recommend attention to the left hemipelvis as well. This recommendation follows the consensus statement: Management of Asymptomatic Ovarian and Other Adnexal Cysts Imaged at [place]: Society of Radiologists in [place]. Radiology 2010; [phone]. 4. Suspicion of trace fluid within the endometrium. Correlate with symptoms of postmenopausal bleeding. Presuming none, this is of doubtful significance. Electronically Signed   By: Abigail Miyamoto M.D.   On: 03/06/2016  16:31   Ct Femur Left W Contrast  Result Date: 03/06/2016 CLINICAL DATA:  Status post fixation of a left hip fracture 02/11/2016. Pain, swelling and discoloration about the left upper leg. EXAM: CT OF THE LEFT FEMUR WITH CONTRAST TECHNIQUE: Contiguous axial CT images were taken through the left upper leg after IV contrast administration. Coronal and sagittal reformatted images are provided. CONTRAST:  100 ml ISOVUE-300 IOPAMIDOL (ISOVUE-300) INJECTION 61% COMPARISON:  Plain films left hip 02/10/2016. FINDINGS: The patient is status post fixation of a left intertrochanteric fracture with a hip screw and short intramedullary nail. In the lateral compartment of the left upper leg, there is a predominantly high attenuating collection measuring 31 cm craniocaudal by approximately 4.1 cm transverse by 9.6 cm AP. Scattered fluid-fluid levels are identified within the collection. Immediately adjacent to the greater trochanter, a low attenuating collection measuring 5.6 cm craniocaudal  by 2.4 cm transverse by 2.3 cm AP is seen. A collection in the subcutaneous fatty tissues at the level of the superior margin of the greater trochanter measures 1.7 cm AP by 4.3 cm transverse by 5.2 cm craniocaudal. It is also low attenuating. Heterotopic ossification or callus formation about the patient's fracture is best seen at the lesser trochanter. Hardware is intact. Screw in the hip abuts the cortex at the articular surface of the femoral head but does not definitely penetrated the cortex. The screw has migrated medially since the prior exam. Healing left superior and inferior pubic ramus fractures are identified. Degenerative disease about the hip and symphysis pubis there is noted. Intrapelvic contents demonstrate a cystic lesion in the right adnexa measuring 4.9 cm AP by 3.7 cm transverse by 4.6 cm craniocaudal. IMPRESSION: Findings consistent with a large hematoma extending the length of the lateral compartment of the left upper leg. Fluid-fluid levels are seen within the hematoma. The hematoma be superinfected but no specific finding to suggest infection is identified. Fluid collections off the right greater trochanter and in the subcutaneous fatty tissues just superior to the greater trochanter and do not appear to communicate. Presumably these are postoperative seromas. Status post fixation of a left intertrochanteric fracture. Screw in the femoral head and neck has migrated medially and now abuts the cortex of the articular surface of the femoral head. Healing left inferior and superior pubic rami fractures. **An incidental finding of potential clinical significance has been found. Cystic lesion left adnexa cannot be definitively characterized. Pelvic ultrasound is recommended further evaluation.** Electronically Signed   By: Inge Rise M.D.   On: 03/06/2016 13:39     IMPRESSION AND PLAN:   82 year old female with past medical history of recurrent DVT, hypertension, hyperlipidemia,  depression, history of breast cancer, osteoporosis who presented to the hospital due to left hip pain and swelling.  1. left hip hematoma-etiology unclear but possibly related to underlying recent left hip surgery. And also being on anticoagulants. -Questionable underlying abscess or infection but the patient is clinically afebrile. Hold off on antibiotics as per orthopedics. -Patient to be scheduled for incision and drainage of the fluid tomorrow.  2. Preoperative cardiovascular examination-patient is a low to moderate risk for noncardiac surgery. -No contraindications to surgery. EKG has been reviewed shows no acute ST or T-wave changes. -Continue perioperative beta blocker. -Continue to hold Coumadin for now.  3. Acute on chronic Anemia-this is acute on chronic anemia secondary to blood loss possibly from underlying hematoma. -I agree with transfusion of 1 unit prior blood cells. Follow hemoglobin. Hold Coumadin.  4. Depression-continue  Effexor, Remeron.  5. Essential hypertension-continue propranolol.  6. History of DVT-unclear this is recurrent or chronic DVT. Patient apparently is on lifelong Coumadin. This needs to be further addressed by primary care physician as if she is not high risk for being on lifelong anticoagulation this can be discontinued.   Thank you so much for the consultation will follow along with you.  All the records are reviewed and case discussed with Consulting provider. Management plans discussed with the patient, family and they are in agreement.  CODE STATUS: Full  TOTAL TIME TAKING CARE OF THIS PATIENT: 45 minutes.    Henreitta Leber M.D on 03/06/2016 at 6:24 PM  Between 7am to 6pm - Pager - [phone]  After 6pm go to [email or web address] - password [email or web address]  Office  [phone]  CC: Primary care Physician: Idelle Crouch, MD

## 2016-03-06 NOTE — ED Notes (Signed)
Blood consent signed by pt and placed on chart. 

## 2016-03-06 NOTE — ED Notes (Signed)
Elsie Amis, rn speaking with dr. Rudene Christians regarding elevated temperature.

## 2016-03-06 NOTE — ED Notes (Signed)
Report from [place], rn. Pt updated on wait time for bed admission. Attempted to call report to floor and was told to call back in 10 minutes.

## 2016-03-06 NOTE — Progress Notes (Signed)
Menz notified of increase in temp and bp values. MD ordered to continue blood transfusion

## 2016-03-06 NOTE — H&P (Signed)
Subjective:   Patient is a 82 y.o. female presents with Left thigh pain. Onset of symptoms was gradual starting several days ago with gradually worsening course since that time. The pain is located on the lateral thigh. Patient describes the pain as aching continuous and rated as moderate. Pain has been associated with recent left hip fracture. She had an IM rod placed and was doing well until recently when she developed more more swelling on the side and increasing pain. She was seen yesterday in the emergency room and had blood work with elevated sedimentation rate and CRP she comes back today with worsening symptoms and feeling tired. Her hemoglobin is also been diminished. Patient denies any new falls. Symptoms are aggravated by pressure on the left side. Symptoms improve with nothing. Past history includes DVT for which she is on chronic anticoagulation.  Previous studies include a CT of the femur that shows large hematoma, sedimentation rate of 81, elevated CRP as well..  Patient Active Problem List   Diagnosis Date Noted  . Anxiety and depression 02/22/2016  . Essential tremor 02/22/2016  . 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 Benign paroxysmal positional vertigo 02/22/2016  . Dizziness 02/22/2016  . Closed left hip fracture ([place]) 02/10/2016   Past Medical History:  Diagnosis Date  . Arthritis   . Asthma   . Cancer (HCC)    BREAST  . Cough    CHRONIC  . Depression   . Dizziness   . DVT (deep venous thrombosis) ([place])   . Edema    FEET/LEGS  . GERD (gastroesophageal reflux disease)   . Heart murmur   . Heart palpitations   . Hepatitis   . HOH (hard of hearing)   . Hyperlipidemia   . Hypertension   . Osteoporosis   . Shortness of breath dyspnea   . Tremors of nervous system     Past Surgical History:  Procedure Laterality Date  . APPENDECTOMY    . BREAST SURGERY     LUMPECTOMY  . CATARACT EXTRACTION  W/PHACO Right 12/18/2015   Procedure: CATARACT EXTRACTION PHACO AND INTRAOCULAR LENS PLACEMENT (IOC);  Surgeon: Eulogio Bear, MD;  Location: [facility] ORS;  Service: Ophthalmology;  Laterality: Right;  [place] 2.00 AP% 14.5 CDE 17.61 Fluid Pack lot # [id number]  . CATARACT EXTRACTION W/PHACO Left 02/05/2016   Procedure: CATARACT EXTRACTION PHACO AND INTRAOCULAR LENS PLACEMENT (IOC);  Surgeon: Eulogio Bear, MD;  Location: [facility] ORS;  Service: Ophthalmology;  Laterality: Left;  [place] 01:23 AP% 8.1 CDE 6.83 Fluid  pack lot # [id number]  . CHOLECYSTECTOMY    . EXTERNAL EAR SURGERY    . EYE SURGERY    . HERNIA REPAIR    . INTRAMEDULLARY (IM) NAIL INTERTROCHANTERIC Left 02/11/2016   Procedure: INTRAMEDULLARY (IM) NAIL INTERTROCHANTRIC;  Surgeon: Dereck Leep, MD;  Location: [facility] ORS;  Service: Orthopedics;  Laterality: Left;  . IVC FILTER PLACEMENT ([facility] HX)    . KNEE ARTHROSCOPY    . MASTECTOMY Left   . NOSE SURGERY     CANCER  . TONSILLECTOMY    . TUBAL LIGATION       (Not in a hospital admission) Allergies  Allergen Reactions  . Morphine And Related Nausea And Vomiting  . Penicillins Other (See Comments)    Unknown Has patient had a PCN reaction causing immediate rash, facial/tongue/throat swelling, SOB or lightheadedness with hypotension: unsure Has patient had a PCN reaction causing severe rash  involving mucus membranes or skin necrosis: unsure Has patient had a PCN reaction that required hospitalization unsure Has patient had a PCN reaction occurring within the last 10 years: no If all of the above answers are "NO", then may proceed with Cephalosporin use.    Social History  Substance Use Topics  . Smoking status: Never Smoker  . Smokeless tobacco: Never Used  . Alcohol use Yes     Comment: [facility]    Family History  Problem Relation Age of Onset  . CAD Mother   . Stroke Father     Review of Systems Pertinent items are noted in HPI.  Objective:   Patient Vitals for the past 8  hrs:  BP Temp Temp src Pulse Resp SpO2  03/06/16 1530 (!) 166/64 - - 75 17 95 %  03/06/16 1430 (!) 122/47 - - 62 (!) 22 96 %  03/06/16 1230 (!) 120/40 - - (!) 59 15 98 %  03/06/16 1200 (!) 118/43 - - 62 14 99 %  03/06/16 1135 (!) 136/53 - - 75 (!) 25 97 %  03/06/16 1035 (!) 115/41 97.9 F (36.6 C) Oral 64 18 94 %   No intake/output data recorded. No intake/output data recorded.    BP (!) 166/64   Pulse 75   Temp 97.9 F (36.6 C) (Oral)   Resp 17   SpO2 95%  General appearance: alert, cooperative, appears stated age and mild distress Lungs: clear to auscultation bilaterally Heart: regular rate and rhythm, S1, S2 normal, no murmur, click, rub or gallop Extremities: There is a large fluctuant mass on the left lateral thigh posterior to prior hip incisions this is warmer than the surrounding area and appears to be an subcutaneous tissue. Additionally in the lateral thigh there is firmness in the muscle and is somewhat tender ecchymosis over the entire leg is old Pulses: 2+ and symmetric  ECG: normal sinus rhythm, no blocks or conduction defects, no ischemic changes.  Data Review CBC:  Lab Results  Component Value Date   WBC 15.5 (H) 03/06/2016   RBC 2.55 (L) 03/06/2016   Coagulation:  Lab Results  Component Value Date   INR 2.59 03/05/2016   INR 4.3 ([place]) 10/20/2011   INR 2.5 04/29/2010   Radiology review: CT shows a large fluid collection lateral left thigh hematoma versus abscess  Assessment:   Active Problems:   * No active hospital problems. *  Left thigh hematoma possible abscess Plan:   Incision and drainage of fluid collection with cultures to be obtained at the time of surgery. Will hold antibiotics until all cultures obtained

## 2016-03-06 NOTE — ED Notes (Signed)
Pt transported to floor, will start blood transfusion with jackie, primary rn.

## 2016-03-06 NOTE — ED Notes (Signed)
Patient transported to Ultrasound 

## 2016-03-06 NOTE — ED Provider Notes (Signed)
[facility] Emergency Department Provider Note   ____________________________________________   First MD Initiated Contact with Patient 03/06/16 1041     (approximate)  I have reviewed the triage vital signs and the nursing notes.   HISTORY  Chief Complaint Anemia and Leg Pain    HPI [name] is a 82 y.o. female patient had an intramedullary nailing for intertrochanteric fracture 8/30 this year by Dr. Marry Guan. Patient has been having increasing anemia and leg pain. She was sent here today for transfusion CT of the femur. Hematocrit today is up slightly from yesterday. She complains of pain and swelling in the thigh but not the lower leg.   Past Medical History:  Diagnosis Date  . Arthritis   . Asthma   . Cancer (HCC)    BREAST  . Cough    CHRONIC  . Depression   . Dizziness   . DVT (deep venous thrombosis) ([place])   . Edema    FEET/LEGS  . GERD (gastroesophageal reflux disease)   . Heart murmur   . Heart palpitations   . Hepatitis   . HOH (hard of hearing)   . Hyperlipidemia   . Hypertension   . Osteoporosis   . Shortness of breath dyspnea   . Tremors of nervous system     Patient Active Problem List   Diagnosis Date Noted  . Anxiety and depression 02/22/2016  . Essential tremor 02/22/2016  . 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 Benign paroxysmal positional vertigo 02/22/2016  . Dizziness 02/22/2016  . Closed left hip fracture ([place]) 02/10/2016    Past Surgical History:  Procedure Laterality Date  . APPENDECTOMY    . BREAST SURGERY     LUMPECTOMY  . CATARACT EXTRACTION W/PHACO Right 12/18/2015   Procedure: CATARACT EXTRACTION PHACO AND INTRAOCULAR LENS PLACEMENT (IOC);  Surgeon: Eulogio Bear, MD;  Location: [facility] ORS;  Service: Ophthalmology;  Laterality: Right;  [place] 2.00 AP% 14.5 CDE 17.61 Fluid Pack lot # [id number]  . CATARACT EXTRACTION W/PHACO  Left 02/05/2016   Procedure: CATARACT EXTRACTION PHACO AND INTRAOCULAR LENS PLACEMENT (IOC);  Surgeon: Eulogio Bear, MD;  Location: [facility] ORS;  Service: Ophthalmology;  Laterality: Left;  [place] 01:23 AP% 8.1 CDE 6.83 Fluid  pack lot # [id number]  . CHOLECYSTECTOMY    . EXTERNAL EAR SURGERY    . EYE SURGERY    . HERNIA REPAIR    . INTRAMEDULLARY (IM) NAIL INTERTROCHANTERIC Left 02/11/2016   Procedure: INTRAMEDULLARY (IM) NAIL INTERTROCHANTRIC;  Surgeon: Dereck Leep, MD;  Location: [facility] ORS;  Service: Orthopedics;  Laterality: Left;  . IVC FILTER PLACEMENT ([facility] HX)    . KNEE ARTHROSCOPY    . MASTECTOMY Left   . NOSE SURGERY     CANCER  . TONSILLECTOMY    . TUBAL LIGATION      Prior to Admission medications   Medication Sig Start Date End Date Taking? Authorizing Provider  acetaminophen (TYLENOL) 500 MG tablet Take 500 mg by mouth every 6 (six) hours as needed for mild pain.   Yes Historical Provider, MD  Cholecalciferol (VITAMIN D3) 1000 units CAPS Take 4 capsules by mouth daily.    Yes Historical Provider, MD  Difluprednate (DUREZOL) 0.05 % EMUL Apply 1 drop to eye 2 (two) times daily. LEFT eye    Yes Historical Provider, MD  ENSURE (ENSURE) Take 237 mLs by mouth.   Yes Historical Provider, MD  ferrous sulfate  325 (65 FE) MG tablet Take 1 tablet (325 mg total) by mouth 2 (two) times daily with a meal. 02/13/16  Yes Dustin Flock, MD  LEVOFLOXACIN IV Inject 750 mg into the vein daily. For cellulitis   Yes Historical Provider, MD  meclizine (ANTIVERT) 12.5 MG tablet Take 12.5 mg by mouth every 6 (six) hours as needed for dizziness.   Yes Historical Provider, MD  methocarbamol (ROBAXIN) 500 MG tablet Take 500 mg by mouth every 6 (six) hours as needed for muscle spasms.   Yes Historical Provider, MD  mirtazapine (REMERON) 7.5 MG tablet Take 7.5 mg by mouth at bedtime.   Yes Historical Provider, MD  oxyCODONE (OXY IR/ROXICODONE) 5 MG immediate release tablet Take 1-2 tablets (5-10 mg total)  by mouth every 4 (four) hours as needed for breakthrough pain ((for MODERATE breakthrough pain)). 02/13/16  Yes Dustin Flock, MD  propranolol ER (INDERAL LA) 60 MG 24 hr capsule Take 60 mg by mouth daily.   Yes Historical Provider, MD  sennosides-docusate sodium (SENOKOT-S) 8.6-50 MG tablet Take 1 tablet by mouth 2 (two) times daily.   Yes Historical Provider, MD  venlafaxine XR (EFFEXOR-XR) 75 MG 24 hr capsule Take 75 mg by mouth daily with breakfast.   Yes Historical Provider, MD  vitamin B-12 (CYANOCOBALAMIN) 1000 MCG tablet Take 1,000 mcg by mouth daily.   Yes Historical Provider, MD  warfarin (COUMADIN) 1 MG tablet Take 0.5 mg by mouth daily.  10/06/15  Yes Historical Provider, MD  warfarin (COUMADIN) 3 MG tablet Take 3 mg by mouth daily.   Yes Historical Provider, MD  moxifloxacin (VIGAMOX) 0.5 % ophthalmic solution Place 1 drop into the left eye 4 (four) times daily.    Historical Provider, MD    Allergies Morphine and related and Penicillins  Family History  Problem Relation Age of Onset  . CAD Mother   . Stroke Father     Social History Social History  Substance Use Topics  . Smoking status: Never Smoker  . Smokeless tobacco: Never Used  . Alcohol use Yes     Comment: OCC    Review of Systems Constitutional: No fever/chills Eyes: No visual changes. ENT: No sore throat. Cardiovascular: Denies chest pain. Respiratory: Denies shortness of breath. Gastrointestinal: No abdominal pain.  No nausea, no vomiting.  No diarrhea.  No constipation. Genitourinary: Negative for dysuria. Musculoskeletal: Negative for back pain. Skin: Negative for rash. Neurological: Negative for headaches, focal weakness or numbness.  10-point ROS otherwise negative.  ____________________________________________   PHYSICAL EXAM:  VITAL SIGNS: ED Triage Vitals [03/06/16 1035]  Enc Vitals Group     BP (!) 115/41     Pulse Rate 64     Resp 18     Temp 97.9 F (36.6 C)     Temp Source Oral       SpO2 94 %     Weight      Height      Head Circumference      Peak Flow      Pain Score      Pain Loc      Pain Edu?      Excl. in [place]?     Constitutional: Alert and oriented. Pale but no acute distress Eyes: Conjunctivae are normal. PERRL. EOMI. Head: Atraumatic. Nose: No congestion/rhinnorhea. Mouth/Throat: Mucous membranes are moist.  Oropharynx non-erythematous. Neck: No stridor.  Cardiovascular: Normal rate, regular rhythm. Grossly normal heart sounds.  Good peripheral circulation. Respiratory: Normal respiratory effort.  No retractions.  Lungs CTAB. Gastrointestinal: Soft and nontender. No distention. No abdominal bruits. No CVA tenderness. Musculoskeletal: Left leg discolored yellow and has bruising. The left lateral femur is firm and somewhat tender. There is no erythema is not particularly hot. Neurologic:  Normal speech and language. No gross focal neurologic deficits are appreciated. No gait instability.   ____________________________________________   LABS (all labs ordered are listed, but only abnormal results are displayed)  Labs Reviewed  COMPREHENSIVE METABOLIC PANEL - Abnormal; Notable for the following:       Result Value   Sodium 131 (*)    Chloride 99 (*)    Glucose, Bld 109 (*)    Calcium 8.1 (*)    Total Protein 5.9 (*)    Albumin 2.2 (*)    ALT 11 (*)    Alkaline Phosphatase 135 (*)    All other components within normal limits  CBC WITH DIFFERENTIAL/PLATELET - Abnormal; Notable for the following:    WBC 15.5 (*)    RBC 2.55 (*)    Hemoglobin 7.4 (*)    HCT 21.8 (*)    RDW 16.4 (*)    Platelets 468 (*)    Neutro Abs 10.9 (*)    Monocytes Absolute 1.9 (*)    All other components within normal limits  URINALYSIS COMPLETEWITH MICROSCOPIC ([facility] ONLY) - Abnormal; Notable for the following:    Color, Urine YELLOW (*)    APPearance CLEAR (*)    Bacteria, UA RARE (*)    Squamous Epithelial / LPF 0-5 (*)    All other components within normal  limits  TYPE AND SCREEN   ____________________________________________  EKG  EKG read and interpreted by me shows normal sinus rhythm rate of 63 normal axis no acute ST-T wave changes ____________________________________________  RADIOLOGY CLINICAL DATA:  Status post fixation of a left hip fracture 02/11/2016. Pain, swelling and discoloration about the left upper leg.  EXAM: CT OF THE LEFT FEMUR WITH CONTRAST  TECHNIQUE: Contiguous axial CT images were taken through the left upper leg after IV contrast administration. Coronal and sagittal reformatted images are provided.  CONTRAST:  100 ml ISOVUE-300 IOPAMIDOL (ISOVUE-300) INJECTION 61%  COMPARISON:  Plain films left hip 02/10/2016.  FINDINGS: The patient is status post fixation of a left intertrochanteric fracture with a hip screw and short intramedullary nail. In the lateral compartment of the left upper leg, there is a predominantly high attenuating collection measuring 31 cm craniocaudal by approximately 4.1 cm transverse by 9.6 cm AP. Scattered fluid-fluid levels are identified within the collection.  Immediately adjacent to the greater trochanter, a low attenuating collection measuring 5.6 cm craniocaudal by 2.4 cm transverse by 2.3 cm AP is seen. A collection in the subcutaneous fatty tissues at the level of the superior margin of the greater trochanter measures 1.7 cm AP by 4.3 cm transverse by 5.2 cm craniocaudal. It is also low attenuating.  Heterotopic ossification or callus formation about the patient's fracture is best seen at the lesser trochanter. Hardware is intact. Screw in the hip abuts the cortex at the articular surface of the femoral head but does not definitely penetrated the cortex. The screw has migrated medially since the prior exam. Healing left superior and inferior pubic ramus fractures are identified. Degenerative disease about the hip and symphysis pubis there  is noted.  Intrapelvic contents demonstrate a cystic lesion in the right adnexa measuring 4.9 cm AP by 3.7 cm transverse by 4.6 cm craniocaudal.  IMPRESSION: Findings consistent with a large hematoma extending  the length of the lateral compartment of the left upper leg. Fluid-fluid levels are seen within the hematoma. The hematoma be superinfected but no specific finding to suggest infection is identified.  Fluid collections off the right greater trochanter and in the subcutaneous fatty tissues just superior to the greater trochanter and do not appear to communicate. Presumably these are postoperative seromas.  Status post fixation of a left intertrochanteric fracture. Screw in the femoral head and neck has migrated medially and now abuts the cortex of the articular surface of the femoral head.  Healing left inferior and superior pubic rami fractures.  **An incidental finding of potential clinical significance has been found. Cystic lesion left adnexa cannot be definitively characterized. Pelvic ultrasound is recommended further evaluation.**   Electronically Signed   By: Inge Rise M.D.   On: 03/06/2016 13:39   ____________________________________________   PROCEDURES  Procedure(s) performed:   Procedures  Critical Care performed:   ____________________________________________   INITIAL IMPRESSION / ASSESSMENT AND PLAN / ED COURSE  Pertinent labs & imaging results that were available during my care of the patient were reviewed by me and considered in my medical decision making (see chart for details).    Clinical Course   I have altered ordered ultrasound to check on the patient's cystic incidental finding on CT the abdomen of the leg. Dr. Youlanda Mighty is coming in to see the patient. Dr. Archie Balboa will watch the patient until then.  ____________________________________________   FINAL CLINICAL IMPRESSION(S) / ED DIAGNOSES  Final diagnoses:   Swelling  Pain      NEW MEDICATIONS STARTED DURING THIS VISIT:  New Prescriptions   No medications on file     Note:  This document was prepared using Dragon voice recognition software and may include unintentional dictation errors.    Nena Polio, MD 03/06/16 [phone]

## 2016-03-06 NOTE — ED Notes (Signed)
Blood released to this rn with a cooler, blood is good in cooler for 4 hours per shay in lab.

## 2016-03-06 NOTE — ED Notes (Signed)
Blood transfusion started.

## 2016-03-07 ENCOUNTER — Encounter
Admission: EM | Disposition: A | Discharge status: Skilled Nursing Facility | Payer: Self-pay | Source: Home / Self Care | Attending: Family Medicine

## 2016-03-07 ENCOUNTER — Encounter: Payer: Self-pay | Admitting: Anesthesiology

## 2016-03-07 ENCOUNTER — Inpatient Hospital Stay: Payer: Medicare Other | Admitting: Anesthesiology

## 2016-03-07 HISTORY — PX: INCISION AND DRAINAGE: SHX5863

## 2016-03-07 LAB — CBC
HCT: 22.6 % — ABNORMAL LOW (ref 35.0–47.0)
HEMOGLOBIN: 7.5 g/dL — AB (ref 12.0–16.0)
MCH: 27.8 pg (ref 26.0–34.0)
MCHC: 33.4 g/dL (ref 32.0–36.0)
MCV: 83.2 fL (ref 80.0–100.0)
Platelets: 419 10*3/uL (ref 150–440)
RBC: 2.71 MIL/uL — AB (ref 3.80–5.20)
RDW: 16.4 % — ABNORMAL HIGH (ref 11.5–14.5)
WBC: 11.5 10*3/uL — AB (ref 3.6–11.0)

## 2016-03-07 LAB — TYPE AND SCREEN
ABO/RH(D): A POS
ANTIBODY SCREEN: NEGATIVE
Unit division: 0
Unit division: 0

## 2016-03-07 LAB — MRSA PCR SCREENING: MRSA by PCR: NEGATIVE

## 2016-03-07 LAB — PROTIME-INR
INR: 1.98
PROTHROMBIN TIME: 22.8 s — AB (ref 11.4–15.2)

## 2016-03-07 SURGERY — INCISION AND DRAINAGE
Anesthesia: General | Laterality: Left | Wound class: Dirty or Infected

## 2016-03-07 MED ORDER — OXYCODONE HCL 5 MG/5ML PO SOLN: 5.0000 mg | Freq: Once | ORAL | Status: DC | PRN

## 2016-03-07 MED ORDER — NEOMYCIN-POLYMYXIN B GU 40-200000 IR SOLN
Status: DC | PRN
  Administered 2016-03-07: 2 mL

## 2016-03-07 MED ORDER — CLINDAMYCIN PHOSPHATE 600 MG/50ML IV SOLN
600.0000 mg | Freq: Four times a day (QID) | INTRAVENOUS | Status: AC
  Administered 2016-03-07 – 2016-03-09 (×9): 600 mg via INTRAVENOUS
  Filled 2016-03-07 (×9): qty 50

## 2016-03-07 MED ORDER — BUPIVACAINE-EPINEPHRINE (PF) 0.25% -1:200000 IJ SOLN
INTRAMUSCULAR | Status: AC
  Filled 2016-03-07: qty 30

## 2016-03-07 MED ORDER — METOCLOPRAMIDE HCL 10 MG PO TABS: 5.0000 mg | ORAL_TABLET | Freq: Three times a day (TID) | ORAL | Status: DC | PRN

## 2016-03-07 MED ORDER — KETAMINE HCL 50 MG/ML IJ SOLN
INTRAMUSCULAR | Status: DC | PRN
Start: 2016-03-07 — End: 2016-03-07
  Administered 2016-03-07: 10 mg via INTRAMUSCULAR
  Administered 2016-03-07: 15 mg via INTRAVENOUS

## 2016-03-07 MED ORDER — PROPOFOL 10 MG/ML IV BOLUS
INTRAVENOUS | Status: DC | PRN
  Administered 2016-03-07 (×2): 20 mg via INTRAVENOUS

## 2016-03-07 MED ORDER — BUPIVACAINE-EPINEPHRINE 0.5% -1:200000 IJ SOLN
INTRAMUSCULAR | Status: DC | PRN
  Administered 2016-03-07: 30 mL

## 2016-03-07 MED ORDER — NEOMYCIN-POLYMYXIN B GU 40-200000 IR SOLN
Status: AC
  Filled 2016-03-07: qty 20

## 2016-03-07 MED ORDER — PROPOFOL 500 MG/50ML IV EMUL
INTRAVENOUS | Status: DC | PRN
  Administered 2016-03-07: 25 ug/kg/min via INTRAVENOUS

## 2016-03-07 MED ORDER — BUPIVACAINE-EPINEPHRINE (PF) 0.5% -1:200000 IJ SOLN
INTRAMUSCULAR | Status: AC
  Filled 2016-03-07: qty 30

## 2016-03-07 MED ORDER — SODIUM CHLORIDE 0.9 % IV SOLN
INTRAVENOUS | Status: DC
  Administered 2016-03-07 – 2016-03-10 (×4): via INTRAVENOUS

## 2016-03-07 MED ORDER — LACTATED RINGERS IV SOLN
INTRAVENOUS | Status: DC | PRN
  Administered 2016-03-07: 10:00:00 via INTRAVENOUS

## 2016-03-07 MED ORDER — FENTANYL CITRATE (PF) 100 MCG/2ML IJ SOLN: 25.0000 ug | INTRAMUSCULAR | Status: DC | PRN

## 2016-03-07 MED ORDER — OXYCODONE HCL 5 MG PO TABS: 5.0000 mg | ORAL_TABLET | Freq: Once | ORAL | Status: DC | PRN

## 2016-03-07 MED ORDER — METOCLOPRAMIDE HCL 5 MG/ML IJ SOLN: 5.0000 mg | Freq: Three times a day (TID) | INTRAMUSCULAR | Status: DC | PRN

## 2016-03-07 MED ORDER — FE FUMARATE-B12-VIT C-FA-IFC PO CAPS
1.0000 | ORAL_CAPSULE | Freq: Three times a day (TID) | ORAL | Status: DC
  Administered 2016-03-07 – 2016-03-10 (×8): 1 via ORAL
  Filled 2016-03-07 (×8): qty 1

## 2016-03-07 MED ORDER — LIDOCAINE 2% (20 MG/ML) 5 ML SYRINGE
INTRAMUSCULAR | Status: DC | PRN
  Administered 2016-03-07: 50 mg via INTRAVENOUS

## 2016-03-07 SURGICAL SUPPLY — 54 items
BNDG COHESIVE 6X5 TAN STRL LF (GAUZE/BANDAGES/DRESSINGS) ×3 IMPLANT
CANISTER SUCT 1200ML W/VALVE (MISCELLANEOUS) ×1 IMPLANT
CANISTER SUCT 3000ML (MISCELLANEOUS) ×5 IMPLANT
CHLORAPREP W/TINT 26ML (MISCELLANEOUS) ×3 IMPLANT
DRAPE INCISE IOBAN 66X60 STRL (DRAPES) ×3 IMPLANT
DRAPE LAPAROTOMY TRNSV 106X77 (MISCELLANEOUS) ×2 IMPLANT
DRAPE SHEET LG 3/4 BI-LAMINATE (DRAPES) ×3 IMPLANT
DRAPE WOUND VAC 10X15X1CM (MISCELLANEOUS) ×3 IMPLANT
DRSG EMULSION OIL 3X8 NADH (GAUZE/BANDAGES/DRESSINGS) ×1 IMPLANT
DRSG VAC ATS MED SENSATRAC (GAUZE/BANDAGES/DRESSINGS) ×2 IMPLANT
DRSG VERSA FOAM LRG 10X15 (GAUZE/BANDAGES/DRESSINGS) ×2 IMPLANT
ELECT CAUTERY BLADE 6.4 (BLADE) ×3 IMPLANT
ELECT REM PT RETURN 9FT ADLT (ELECTROSURGICAL) ×3
ELECTRODE REM PT RTRN 9FT ADLT (ELECTROSURGICAL) ×1 IMPLANT
GAUZE PETRO XEROFOAM 1X8 (MISCELLANEOUS) ×6 IMPLANT
GAUZE SPONGE 4X4 12PLY STRL (GAUZE/BANDAGES/DRESSINGS) ×3 IMPLANT
GLOVE BIOGEL PI IND STRL 9 (GLOVE) ×1 IMPLANT
GLOVE BIOGEL PI INDICATOR 9 (GLOVE) ×2
GLOVE SURG SYN 9.0  PF PI (GLOVE) ×6
GLOVE SURG SYN 9.0 PF PI (GLOVE) ×1 IMPLANT
GOWN SRG 2XL LVL 4 RGLN SLV (GOWNS) ×1 IMPLANT
GOWN STRL NON-REIN 2XL LVL4 (GOWNS) ×3
GOWN STRL REUS W/ TWL LRG LVL3 (GOWN DISPOSABLE) ×1 IMPLANT
GOWN STRL REUS W/TWL LRG LVL3 (GOWN DISPOSABLE) ×3
GOWN STRL REUS W/TWL XL LVL4 (GOWN DISPOSABLE) ×3 IMPLANT
HANDPIECE INTERPULSE COAX TIP (DISPOSABLE) ×3
HEMOVAC 400ML (MISCELLANEOUS) ×3
KIT DRAIN HEMOVAC JP 7FR 400ML (MISCELLANEOUS) ×1 IMPLANT
KIT RM TURNOVER STRD PROC AR (KITS) ×3 IMPLANT
NDL FILTER BLUNT 18X1 1/2 (NEEDLE) ×1 IMPLANT
NDL MAYO CATGUT SZ4 TPR NDL (NEEDLE) ×1 IMPLANT
NDL SAFETY 18GX1.5 (NEEDLE) ×3 IMPLANT
NEEDLE FILTER BLUNT 18X 1/2SAF (NEEDLE) ×2
NEEDLE FILTER BLUNT 18X1 1/2 (NEEDLE) ×1 IMPLANT
NEEDLE MAYO CATGUT SZ4 (NEEDLE) ×3 IMPLANT
NS IRRIG 1000ML POUR BTL (IV SOLUTION) ×3 IMPLANT
PACK HIP PROSTHESIS (MISCELLANEOUS) ×3 IMPLANT
PAD NEG PRESSURE SENSATRAC (MISCELLANEOUS) ×2 IMPLANT
SET HNDPC FAN SPRY TIP SCT (DISPOSABLE) ×1 IMPLANT
SOL .9 NS 3000ML IRR  AL (IV SOLUTION) ×2
SOL .9 NS 3000ML IRR AL (IV SOLUTION) ×1
SOL .9 NS 3000ML IRR UROMATIC (IV SOLUTION) IMPLANT
STAPLER SKIN PROX 35W (STAPLE) ×3 IMPLANT
SUT ETHIBOND #5 BRAIDED 30INL (SUTURE) ×3 IMPLANT
SUT TICRON 2-0 30IN 311381 (SUTURE) ×9 IMPLANT
SUT VIC AB 0 CT1 27 (SUTURE) ×6
SUT VIC AB 0 CT1 27XCR 8 STRN (SUTURE) ×2 IMPLANT
SUT VIC AB 1 CTX 27 (SUTURE) ×6 IMPLANT
SUT VIC AB 2-0 CT1 27 (SUTURE) ×3
SUT VIC AB 2-0 CT1 TAPERPNT 27 (SUTURE) ×1 IMPLANT
SWAB CULTURE AMIES ANAERIB BLU (MISCELLANEOUS) ×3 IMPLANT
SYRINGE 10CC LL (SYRINGE) ×3 IMPLANT
TAPE MICROFOAM 4IN (TAPE) ×3 IMPLANT
WND VAC CANISTER 500ML (MISCELLANEOUS) ×3 IMPLANT

## 2016-03-07 NOTE — Progress Notes (Signed)
Subjective: Just back from OR after evacuation of left hip hematoma. No complaints.  Objective: Vital signs in last 24 hours: Temp:  [98.4 F (36.9 C)-99.3 F (37.4 C)] 98.4 F (36.9 C) (09/24 1156) Pulse Rate:  [62-76] 66 (09/24 1156) Resp:  [14-26] 18 (09/24 1156) BP: (114-166)/(38-64) 123/48 (09/24 1156) SpO2:  [93 %-99 %] 98 % (09/24 1156) Weight:  [69.8 kg (153 lb 12.8 oz)-70 kg (154 lb 6.4 oz)] 69.8 kg (153 lb 12.8 oz) (09/24 0441) Weight change:  Last BM Date: 03/06/16  Intake/Output from previous day: 09/23 0701 - 09/24 0700 In: 587 [Blood:587] Out: 600 [Urine:600] Intake/Output this shift: Total I/O In: 200 [I.V.:200] Out: -   General appearance: no distress Resp: clear to auscultation bilaterally and normal percussion bilaterally Cardio: regular rate and rhythm, S1, S2 normal, no murmur, click, rub or gallop  Lab Results:  Recent Labs  03/06/16 1120 03/07/16 0424  WBC 15.5* 11.5*  HGB 7.4* 7.5*  HCT 21.8* 22.6*  PLT 468* 419   BMET  Recent Labs  03/05/16 1930 03/06/16 1120  NA 131* 131*  K 4.2 4.6  CL 98* 99*  CO2 29 26  GLUCOSE 111* 109*  BUN 16 13  CREATININE 0.64 0.68  CALCIUM 8.1* 8.1*    Studies/Results: US Pelvis Complete  Result Date: 03/06/2016 CLINICAL DATA:  Left-sided ovarian cystic lesion on CT. EXAM: TRANSABDOMINAL ULTRASOUND OF PELVIS TECHNIQUE: Transabdominal ultrasound examination of the pelvis was performed including evaluation of the uterus, ovaries, adnexal regions, and pelvic cul-de-sac. COMPARISON:  CT of earlier today. FINDINGS: Uterus Measurements: 7.5 x 1.4 x 2.7 cm. Normal in morphology for age. Endometrium Thickness: On the order of 6-7 mm. No focal abnormality. Trace fluid is suspected within the endometrium of the uterine fundus on image 26. Right ovary Not visualized. A simple appearing cystic lesion is identified within the right adnexa and measures 4.6 x 3.7 x 3.7 cm. Left ovary Not visualized. No left-sided ovarian  or adnexal cystic lesion is seen to correspond to the CT abnormality. Other findings:  No abnormal free fluid. IMPRESSION: 1. Exam performed transabdominally only at patient preference. 2. The left pelvic cystic lesion is not identified. This is simple in appearance on noncontrast CT. 3. Simple appearing right adnexal lesion is also identified measuring maximally 4.6 cm. Question peritoneal inclusion cyst or less likely ovarian cyst. Per consensus criteria, this warrants follow-up with pelvic ultrasound at 1 year. On this exam, recommend attention to the left hemipelvis as well. This recommendation follows the consensus statement: Management of Asymptomatic Ovarian and Other Adnexal Cysts Imaged at [place]: Society of Radiologists in [place]. Radiology 2010; [phone]. 4. Suspicion of trace fluid within the endometrium. Correlate with symptoms of postmenopausal bleeding. Presuming none, this is of doubtful significance. Electronically Signed   By: Abigail Miyamoto M.D.   On: 03/06/2016 16:31   Ct Femur Left W Contrast  Result Date: 03/06/2016 CLINICAL DATA:  Status post fixation of a left hip fracture 02/11/2016. Pain, swelling and discoloration about the left upper leg. EXAM: CT OF THE LEFT FEMUR WITH CONTRAST TECHNIQUE: Contiguous axial CT images were taken through the left upper leg after IV contrast administration. Coronal and sagittal reformatted images are provided. CONTRAST:  100 ml ISOVUE-300 IOPAMIDOL (ISOVUE-300) INJECTION 61% COMPARISON:  Plain films left hip 02/10/2016. FINDINGS: The patient is status post fixation of a left intertrochanteric fracture with a hip screw and short intramedullary nail. In the lateral compartment of the left upper leg, there is  a predominantly high attenuating collection measuring 31 cm craniocaudal by approximately 4.1 cm transverse by 9.6 cm AP. Scattered fluid-fluid levels are identified within the collection. Immediately adjacent to the  greater trochanter, a low attenuating collection measuring 5.6 cm craniocaudal by 2.4 cm transverse by 2.3 cm AP is seen. A collection in the subcutaneous fatty tissues at the level of the superior margin of the greater trochanter measures 1.7 cm AP by 4.3 cm transverse by 5.2 cm craniocaudal. It is also low attenuating. Heterotopic ossification or callus formation about the patient's fracture is best seen at the lesser trochanter. Hardware is intact. Screw in the hip abuts the cortex at the articular surface of the femoral head but does not definitely penetrated the cortex. The screw has migrated medially since the prior exam. Healing left superior and inferior pubic ramus fractures are identified. Degenerative disease about the hip and symphysis pubis there is noted. Intrapelvic contents demonstrate a cystic lesion in the right adnexa measuring 4.9 cm AP by 3.7 cm transverse by 4.6 cm craniocaudal. IMPRESSION: Findings consistent with a large hematoma extending the length of the lateral compartment of the left upper leg. Fluid-fluid levels are seen within the hematoma. The hematoma be superinfected but no specific finding to suggest infection is identified. Fluid collections off the right greater trochanter and in the subcutaneous fatty tissues just superior to the greater trochanter and do not appear to communicate. Presumably these are postoperative seromas. Status post fixation of a left intertrochanteric fracture. Screw in the femoral head and neck has migrated medially and now abuts the cortex of the articular surface of the femoral head. Healing left inferior and superior pubic rami fractures. **An incidental finding of potential clinical significance has been found. Cystic lesion left adnexa cannot be definitively characterized. Pelvic ultrasound is recommended further evaluation.** Electronically Signed   By: Inge Rise M.D.   On: 03/06/2016 13:39    Medications: I have reviewed the patient's  current medications.  Assessment/Plan: 1. Acute Blood Loss Anemia: Secondary to hematoma. S/P transfusion. Will recheck Hgb in AM.  2. Hx DVT: Now off coumadin secondary to hematoma. Has history of multiple DVTs and has a family history of blood clots. I would restart anticoagulation as soon as it is safe to do so post op.  3. Hematoma: S/P evacuation.  4. HTN: Controlled.  Time spent 20 min.  LOS: 1 day   Baxter Hire 03/07/2016, 1:42 PM

## 2016-03-07 NOTE — Op Note (Signed)
03/06/2016 - 03/07/2016  10:43 AM  PATIENT:  [name]  82 y.o. female  PRE-OPERATIVE DIAGNOSIS:  left thigh hematoma possible abscess  POST-OPERATIVE DIAGNOSIS:  Probable abscess left lateral thigh  PROCEDURE:  Procedure(s): INCISION AND DRAINAGE (Left)  SURGEON: Laurene Footman, MD  ASSISTANTS: None  ANESTHESIA:   local and MAC  EBL:  Total I/O In: 100 [I.V.:100] Out: -   BLOOD ADMINISTERED:none  DRAINS: Wound VAC with white foam   LOCAL MEDICATIONS USED:  MARCAINE     SPECIMEN:  Source of Specimen:  Culture 2  DISPOSITION OF SPECIMEN:  PATHOLOGY  COUNTS:  YES  TOURNIQUET:  * No tourniquets in log *  IMPLANTS: None  DICTATION: .Dragon Dictation patient was brought to the operating room and after adequate sedation was given bump was placed under the buttock to elevate the left thigh. The thigh was then prepped and draped in usual sterile fashion after appropriate patient identification and timeout procedure had been given, 30 cc of half percent Sensorcaine with epinephrine was injected into the area of the planned incision which is just posterior to the prior incisions. After the local anesthetic had been given and after completely prepping and draping the area of testing for local anesthetic effectiveness, incision was made approximately 2 inches in length in the area between the proximal 2 incisions slightly posterior. The subcutaneous tissue was then spread and fluid collection entered when 2 sets of cultures obtained. After getting into the fluid-filled cavity that appeared to have a fluid consistent with abscess pulse irrigation was used to thoroughly irrigate out the area and then a wound VAC was applied with white foam placed into the defect the wound VAC suction was then applied.  PLAN OF CARE: Continue as inpatient  PATIENT DISPOSITION:  PACU - hemodynamically stable.

## 2016-03-07 NOTE — Anesthesia Preprocedure Evaluation (Signed)
Anesthesia Evaluation  Patient identified by MRN, date of birth, ID band Patient awake    Reviewed: Allergy & Precautions, H&P , NPO status , Patient's Chart, lab work & pertinent test results  History of Anesthesia Complications Negative for: history of anesthetic complications  Airway Mallampati: III  TM Distance: <3 FB Neck ROM: limited    Dental  (+) Poor Dentition, Chipped   Pulmonary shortness of breath, asthma ,    Pulmonary exam normal breath sounds clear to auscultation       Cardiovascular Exercise Tolerance: Poor hypertension, (-) angina+ Peripheral Vascular Disease  (-) Past MI Normal cardiovascular exam Rhythm:regular Rate:Normal     Neuro/Psych PSYCHIATRIC DISORDERS Depression negative neurological ROS     GI/Hepatic negative GI ROS, Neg liver ROS, GERD  ,(+) Hepatitis -  Endo/Other  negative endocrine ROS  Renal/GU negative Renal ROS  negative genitourinary   Musculoskeletal  (+) Arthritis ,   Abdominal   Peds  Hematology negative hematology ROS (+)   Anesthesia Other Findings Past Medical History: No date: Arthritis No date: Asthma No date: Cancer ([place])     Comment: BREAST No date: Cough     Comment: CHRONIC No date: Depression No date: Dizziness No date: DVT (deep venous thrombosis) (HCC) No date: Edema     Comment: FEET/LEGS No date: GERD (gastroesophageal reflux disease) No date: Heart murmur No date: Heart palpitations No date: Hepatitis No date: HOH (hard of hearing) No date: Hyperlipidemia No date: Hypertension No date: Osteoporosis No date: Shortness of breath dyspnea No date: Tremors of nervous system  Past Surgical History: No date: APPENDECTOMY No date: BREAST SURGERY     Comment: LUMPECTOMY 12/18/2015: CATARACT EXTRACTION W/PHACO Right     Comment: Procedure: CATARACT EXTRACTION PHACO AND               INTRAOCULAR LENS PLACEMENT (IOC);  Surgeon:               Eulogio Bear, MD;  Location: [facility] ORS;                Service: Ophthalmology;  Laterality: Right;  [place]              2.00 AP% 14.5 CDE 17.61 Fluid Pack lot #               [id number] 02/05/2016: CATARACT EXTRACTION W/PHACO Left     Comment: Procedure: CATARACT EXTRACTION PHACO AND               INTRAOCULAR LENS PLACEMENT (IOC);  Surgeon:               Eulogio Bear, MD;  Location: [facility] ORS;                Service: Ophthalmology;  Laterality: Left;  [place]               01:23 AP% 8.1 CDE 6.83 Fluid  pack lot #               [id number] No date: CHOLECYSTECTOMY No date: EXTERNAL EAR SURGERY No date: EYE SURGERY No date: HERNIA REPAIR 02/11/2016: INTRAMEDULLARY (IM) NAIL INTERTROCHANTERIC Left     Comment: Procedure: INTRAMEDULLARY (IM) NAIL               INTERTROCHANTRIC;  Surgeon: Dereck Leep, MD;              Location: [facility] ORS;  Service: Orthopedics;  Laterality: Left; No date: IVC FILTER PLACEMENT ([facility] HX) No date: KNEE ARTHROSCOPY No date: MASTECTOMY Left No date: NOSE SURGERY     Comment: CANCER No date: TONSILLECTOMY No date: TUBAL LIGATION  BMI    Body Mass Index:  26.40 kg/m      Reproductive/Obstetrics negative OB ROS                             Anesthesia Physical Anesthesia Plan  ASA: IV  Anesthesia Plan: General   Post-op Pain Management:    Induction:   Airway Management Planned:   Additional Equipment:   Intra-op Plan:   Post-operative Plan:   Informed Consent: I have reviewed the patients History and Physical, chart, labs and discussed the procedure including the risks, benefits and alternatives for the proposed anesthesia with the patient or authorized representative who has indicated his/her understanding and acceptance.   Dental Advisory Given  Plan Discussed with: Anesthesiologist, CRNA and Surgeon  Anesthesia Plan Comments:         Anesthesia Quick Evaluation

## 2016-03-07 NOTE — Transfer of Care (Signed)
Immediate Anesthesia Transfer of Care Note  Patient: [name]  Procedure(s) Performed: Procedure(s): INCISION AND DRAINAGE (Left)  Patient Location: PACU  Anesthesia Type:General  Level of Consciousness: awake and alert   Airway & Oxygen Therapy: Patient Spontanous Breathing and Patient connected to face mask oxygen  Post-op Assessment: Post -op Vital signs reviewed and stable  Post vital signs: Reviewed  Last Vitals:  Vitals:   03/07/16 0441 03/07/16 1043  BP: (!) 125/51 (!) 142/50  Pulse: 67 70  Resp: 18 20  Temp: 36.9 C 36.9 C    Last Pain:  Vitals:   03/07/16 0441  TempSrc: Oral  PainSc:          Complications: No apparent anesthesia complications

## 2016-03-07 NOTE — Progress Notes (Signed)
PT Cancellation Note  Patient Details Name: [name] MRN: [id number] DOB: 1925-01-26   Cancelled Treatment:    Reason Eval/Treat Not Completed: Patient at procedure or test/unavailable. Patient currently undergoing I&D for abcess, in PACU currently. PT will hold mobility evaluation until patient is more appropriate.   Kerman Passey, PT, DPT    03/07/2016, 12:19 PM

## 2016-03-07 NOTE — Anesthesia Postprocedure Evaluation (Signed)
Anesthesia Post Note  Patient: [name]  Procedure(s) Performed: Procedure(s) (LRB): INCISION AND DRAINAGE (Left)  Patient location during evaluation: PACU Anesthesia Type: General Level of consciousness: awake and alert Pain management: pain level controlled Vital Signs Assessment: post-procedure vital signs reviewed and stable Respiratory status: spontaneous breathing, nonlabored ventilation, respiratory function stable and patient connected to nasal cannula oxygen Cardiovascular status: blood pressure returned to baseline and stable Postop Assessment: no signs of nausea or vomiting Anesthetic complications: no    Last Vitals:  Vitals:   03/07/16 1057 03/07/16 1124  BP: (!) 119/48   Pulse: 64 65  Resp: 17 16  Temp:  37.4 C    Last Pain:  Vitals:   03/07/16 1120  TempSrc:   PainSc: 3                  Precious Haws Sumaiyah Markert

## 2016-03-08 ENCOUNTER — Encounter: Payer: Self-pay | Admitting: Orthopedic Surgery

## 2016-03-08 LAB — HEMOGLOBIN: Hemoglobin: 8.7 g/dL — ABNORMAL LOW (ref 12.0–16.0)

## 2016-03-08 NOTE — Progress Notes (Signed)
Subjective: 1 Day Post-Op Procedure(s) (LRB): INCISION AND DRAINAGE left hip and application of wound vac (Left) Patient reports pain as mild.   Patient is well, and has had no acute complaints or problems Plan is to go Skilled nursing facility after hospital stay. Negative for chest pain and shortness of breath Fever: no Gastrointestinal:Negative for nausea and vomiting  Objective: Vital signs in last 24 hours: Temp:  [97.8 F (36.6 C)-99.3 F (37.4 C)] 98.2 F (36.8 C) (09/25 0355) Pulse Rate:  [63-71] 64 (09/25 0355) Resp:  [16-21] 16 (09/25 0355) BP: (110-149)/(35-54) 149/52 (09/25 0355) SpO2:  [95 %-100 %] 100 % (09/25 0355) FiO2 (%):  [28 %] 28 % (09/24 1205)  Intake/Output from previous day:  Intake/Output Summary (Last 24 hours) at 03/08/16 0746 Last data filed at 03/08/16 0655  Gross per 24 hour  Intake          2235.83 ml  Output              300 ml  Net          1935.83 ml    Intake/Output this shift: No intake/output data recorded.  Labs:  Recent Labs  03/05/16 1930 03/06/16 1120 03/07/16 0424 03/08/16 0338  HGB 7.0* 7.4* 7.5* 8.7*    Recent Labs  03/06/16 1120 03/07/16 0424  WBC 15.5* 11.5*  RBC 2.55* 2.71*  HCT 21.8* 22.6*  PLT 468* 419    Recent Labs  03/05/16 1930 03/06/16 1120  NA 131* 131*  K 4.2 4.6  CL 98* 99*  CO2 29 26  BUN 16 13  CREATININE 0.64 0.68  GLUCOSE 111* 109*  CALCIUM 8.1* 8.1*    Recent Labs  03/07/16 0424  INR 1.98     EXAM General - Patient is Alert, Appropriate and drowsy this AM but answers questions appropriately. Extremity - ABD soft Sensation intact distally Dorsiflexion/Plantar flexion intact Incision: Woundvac in place with mild drainage. No cellulitis present Dressing/Incision - No erythema present, Woundvac in place. Motor Function - intact, moving foot and toes well on exam.   Abd soft with normal BS.  Past Medical History:  Diagnosis Date  . Arthritis   . Asthma   . Cancer  (HCC)    BREAST  . Cough    CHRONIC  . Depression   . Dizziness   . DVT (deep venous thrombosis) ([place])   . Edema    FEET/LEGS  . GERD (gastroesophageal reflux disease)   . Heart murmur   . Heart palpitations   . Hepatitis   . HOH (hard of hearing)   . Hyperlipidemia   . Hypertension   . Osteoporosis   . Shortness of breath dyspnea   . Tremors of nervous system     Assessment/Plan: 1 Day Post-Op Procedure(s) (LRB): INCISION AND DRAINAGE left hip and application of wound vac (Left) Active Problems:   Abscess of left thigh  Estimated body mass index is 26.4 kg/m as calculated from the following:   Height as of this encounter: 5\' 4"  (1.626 m).   Weight as of this encounter: 69.8 kg (153 lb 12.8 oz). Advance diet Up with therapy   Up with PT today, will likely need short term rehab following discharge. Continue Woundvac at this time. Labs reviewed this AM, only Hg available, will place order for CBC and BMP for tomorrow morning. Cultures from left hip still pending, will continue Cleocin at this time.  DVT Prophylaxis - Lovenox, Foot Pumps and TED hose Weight-Bearing as tolerated  to left leg  J. Cameron Proud, PA-C [facility] 03/08/2016, 7:46 AM

## 2016-03-08 NOTE — Progress Notes (Signed)
Paged [place], [place] about wound vac change orders that stated to use "white cloth and not black form, Monday and Thursday with a start date of today". This RN inquired about the 'white cloth'. [place] was unsure about this and talked with Gerald Stabs, [place] and stated to use the Mepitel dressing and a piece of black form over the wound. When this RN went to change the dressing, this is not what was on the wound. Paged Dr. Rudene Christians. He clarified the order that the wound care orders were the the nursing home not for [place] and to not change it.  The top dressing was already removed, but the packing was not. This RN left the packing in place and covered with the Tegaderm film and black form for suction and covered again. Suction was attached without any leaks.

## 2016-03-08 NOTE — Care Management Important Message (Signed)
Important Message  Patient Details  Name: [name] MRN: [id number] Date of Birth: [birth year]   Medicare Important Message Given:       Jolly Mango, RN 03/08/2016, 9:14 AM

## 2016-03-08 NOTE — NC FL2 (Signed)
[place] LEVEL OF CARE SCREENING TOOL     IDENTIFICATION  Patient Name: [name] Birthdate: 1925/03/12 Sex: female Admission Date (Current Location): 03/06/2016  [place] and [place] Number:  Engineering geologist and Address:  [facility], [address] [place], [place] [address]      Provider Number: [id number]  Attending Physician Name and Address:  Harvie Bridge, DO  Relative Name and Phone Number:       Current Level of Care: Hospital Recommended Level of Care: [facility] Prior Approval Number:    Date Approved/Denied:   PASRR Number:  ( [id number] )  Discharge Plan: SNF    Current Diagnoses: Patient Active Problem List   Diagnosis Date Noted  . Abscess of left thigh 03/06/2016  . Anxiety and depression 02/22/2016  . Essential tremor 02/22/2016  . 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 Benign paroxysmal positional vertigo 02/22/2016  . Dizziness 02/22/2016  . Closed left hip fracture ([place]) 02/10/2016    Orientation RESPIRATION BLADDER Height & Weight     Self, Time, Situation, Place  O2 (2 Liters Oxygen ) Incontinent, Indwelling catheter Weight: 153 lb 12.8 oz (69.8 kg) Height:  5\' 4"  (162.6 cm)  BEHAVIORAL SYMPTOMS/MOOD NEUROLOGICAL BOWEL NUTRITION STATUS   (none)  (none) Continent Diet (Heart Healthy )  AMBULATORY STATUS COMMUNICATION OF NEEDS Skin   Extensive Assist Verbally Surgical wounds, Wound Vac, Other (Comment) (Will need Wound Vac (Amount if Suction 165mm/HG, Suction Type continuous)  )                       Personal Care Assistance Level of Assistance  Bathing, Feeding, Dressing Bathing Assistance: Limited assistance Feeding assistance: Independent Dressing Assistance: Limited assistance     Functional Limitations Info  Sight, Hearing, Speech Sight Info: Adequate Hearing Info: Adequate Speech Info:  Adequate    SPECIAL CARE FACTORS FREQUENCY  PT (By licensed PT), OT (By licensed OT)     PT Frequency:  (5) OT Frequency:  (5)            Contractures      Additional Factors Info  Code Status, Allergies Code Status Info:  (Full Code. ) Allergies Info:  (Morphine And Related, Penicillins)           Current Medications (03/08/2016):  This is the current hospital active medication list Current Facility-Administered Medications  Medication Dose Route Frequency Provider Last Rate Last Dose  . 0.9 %  sodium chloride infusion   Intravenous Continuous Hessie Knows, MD 50 mL/hr at 03/08/16 [phone]    . acetaminophen (TYLENOL) tablet 650 mg  650 mg Oral Q6H PRN Hessie Knows, MD   650 mg at 03/07/16 2042   Or  . acetaminophen (TYLENOL) suppository 650 mg  650 mg Rectal Q6H PRN Hessie Knows, MD      . cholecalciferol (VITAMIN D) tablet 4,000 Units  4,000 Units Oral Daily Henreitta Leber, MD   4,000 Units at 03/08/16 0940  . clindamycin (CLEOCIN) IVPB 600 mg  600 mg Intravenous Q6H Hessie Knows, MD   600 mg at 03/08/16 1343  . diphenhydrAMINE (BENADRYL) 12.5 MG/5ML elixir 12.5-25 mg  12.5-25 mg Oral Q4H PRN Hessie Knows, MD      . docusate sodium (COLACE) capsule 100 mg  100 mg Oral BID Hessie Knows, MD   100 mg at 03/08/16 0940  . ferrous [id number] C-folic acid (TRINSICON /  FOLTRIN) capsule 1 capsule  1 capsule Oral TID PC Hessie Knows, MD   1 capsule at 03/08/16 1208  . HYDROmorphone (DILAUDID) injection 0.5 mg  0.5 mg Intravenous Q2H PRN Hessie Knows, MD   0.5 mg at 03/06/16 1935  . meclizine (ANTIVERT) tablet 12.5 mg  12.5 mg Oral Q6H PRN Henreitta Leber, MD      . methocarbamol (ROBAXIN) tablet 500 mg  500 mg Oral Q6H PRN Henreitta Leber, MD      . mirtazapine (REMERON) tablet 7.5 mg  7.5 mg Oral QHS Henreitta Leber, MD   7.5 mg at 03/07/16 2043  . moxifloxacin (VIGAMOX) 0.5 % ophthalmic solution 1 drop  1 drop Left Eye QID Henreitta Leber, MD   1 drop at 03/08/16 1343  .  ondansetron (ZOFRAN) tablet 4 mg  4 mg Oral Q6H PRN Hessie Knows, MD       Or  . ondansetron [facility]) injection 4 mg  4 mg Intravenous Q6H PRN Hessie Knows, MD   4 mg at 03/06/16 1938  . oxyCODONE (Oxy IR/ROXICODONE) immediate release tablet 5-10 mg  5-10 mg Oral Q3H PRN Hessie Knows, MD   5 mg at 03/08/16 0405  . oxyCODONE (Oxy IR/ROXICODONE) immediate release tablet 5-10 mg  5-10 mg Oral Q4H PRN Henreitta Leber, MD   5 mg at 03/07/16 2045  . propranolol ER (INDERAL LA) 24 hr capsule 60 mg  60 mg Oral Daily Henreitta Leber, MD   60 mg at 03/08/16 0939  . venlafaxine XR (EFFEXOR-XR) 24 hr capsule 75 mg  75 mg Oral Q breakfast Henreitta Leber, MD   75 mg at 03/08/16 0939  . vitamin B-12 (CYANOCOBALAMIN) tablet 1,000 mcg  1,000 mcg Oral Daily Henreitta Leber, MD   1,000 mcg at 03/08/16 [id number]  . zolpidem (AMBIEN) tablet 5 mg  5 mg Oral QHS PRN Hessie Knows, MD         Discharge Medications: Please see discharge summary for a list of discharge medications.  Relevant Imaging Results:  Relevant Lab Results:   Additional Information  (SSN: [id number])  Helder Crisafulli, Veronia Beets, LCSW

## 2016-03-08 NOTE — Clinical Social Work Note (Signed)
Clinical Social Work Assessment  Patient Details  Name: [name] MRN: [id number] Date of Birth: 09/29/1924  Date of referral:  03/08/16               Reason for consult:  Other (Comment Required) (from [facility]. )                Permission sought to share information with:  Facility Art therapist granted to share information::  Yes, Verbal Permission Granted  Name::        Agency::     Relationship::     Contact Information:     Housing/Transportation Living arrangements for the past 2 months:  [place], [place] of Information:  Patient, Adult Children Patient Interpreter Needed:  None Criminal Activity/Legal Involvement Pertinent to Current Situation/Hospitalization:  No - Comment as needed Significant Relationships:  Adult Children Lives with:  Facility Resident, Self Do you feel safe going back to the place where you live?  Yes Need for family participation in patient care:  Yes (Comment)  Care giving concerns:  Patient came to [facility] from [place]. Patient has been at [facility] for short term rehab.    Social Worker assessment / plan:  Holiday representative ([place]) received verbal consult from PT that patient is from [place] and the recommendation from PT is SNF. CSW met with patient alone at bedside. Patient was alert and oriented and was sitting up in the chair. Per patient she lives at the [facility] independent living section near [facility]. Patient reported that she plans on returning to [facility] and requested for CSW to call her daughter Berline Chough. Per [facility] admissions coordinator at [facility] patient can return. CSW made Sharyn Lull aware that patient will need a wound vac and possible IV Abx. CSW contacted patient's daughter Berline Chough and made her aware of above. Berline Chough is in agreement with patient returning to [facility].   Employment status:    Insurance information:  Medicare PT  Recommendations:  [place] / Referral to community resources:  [place]  Patient/Family's Response to care:  Patient and daughter are agreeable for patient to return to [employer]   Patient/Family's Understanding of and Emotional Response to Diagnosis, Current Treatment, and Prognosis:  Patient and daughter were pleasant and thanked CSW for visit.   Emotional Assessment Appearance:  Appears stated age Attitude/Demeanor/Rapport:    Affect (typically observed):  Accepting, Adaptable, Pleasant Orientation:  Oriented to Self, Oriented to Place, Oriented to  Time, Oriented to Situation Alcohol / Substance use:  Not Applicable Psych involvement (Current and /or in the community):  No (Comment)  Discharge Needs  Concerns to be addressed:  Discharge Planning Concerns Readmission within the last 30 days:  No Current discharge risk:  Dependent with Mobility Barriers to Discharge:  Continued Medical Work up   [employer], Veronia Beets, LCSW 03/08/2016, 3:39 PM

## 2016-03-08 NOTE — Evaluation (Signed)
Physical Therapy Evaluation Patient Details Name: [name] MRN: [id number] DOB: [birth year] Today's Date: 03/08/2016   History of Present Illness  Pt admitted for large hematoma in lateral compartment of L LE. Pt went for I&D on 9/24. History includes ORIF of L hip fx on 8/31 and healing sup/inf pubic rami fx. Pt also with arthritis and hx of breast cancer. Pt presents from SNF at this time.  Clinical Impression  Pt is a pleasant 82 year old female who was admitted for abscess in L thigh. All mobility performed on room air with 90% at rest, however decreasing to 79% with exertion. 2L of O2 applied once in recliner with sats improving quickly to 97%. RN aware. Pt performs bed mobility, transfers, and ambulation with min assist and RW. +2 assist for safety and equipment. Pt demonstrates deficits with strength/pain/mobility. Pt very motivated to perform therapy. Would benefit from skilled PT to address above deficits and promote optimal return to PLOF; recommend transition to STR upon discharge from acute hospitalization.       Follow Up Recommendations SNF    Equipment Recommendations       Recommendations for Other Services       Precautions / Restrictions Precautions Precautions: Fall Restrictions Weight Bearing Restrictions: Yes LLE Weight Bearing: Weight bearing as tolerated      Mobility  Bed Mobility Overal bed mobility: Needs Assistance Bed Mobility: Supine to Sit     Supine to sit: Min assist     General bed mobility comments: assist for sliding B LE off bed. Once seated at EOB, pt able to sit with supervision  Transfers Overall transfer level: Needs assistance Equipment used: Rolling walker (2 wheeled) Transfers: Sit to/from Stand Sit to Stand: Min assist         General transfer comment: +2 for safety, however pt only requires min assist for transfer with cues for pushing from seated surface. Once standing, pt with khypotic  posture  Ambulation/Gait Ambulation/Gait assistance: Min assist Ambulation Distance (Feet): 15 Feet Assistive device: Rolling walker (2 wheeled) Gait Pattern/deviations: Step-through pattern     General Gait Details: ambulated using RW and +2 for equipment. Pt ambulates with short shuffling gait pattern around bed. Pt fatigues quickly and is SOB once seated in recliner. All ambulation performed on room air.  Stairs            Wheelchair Mobility    Modified Rankin (Stroke Patients Only)       Balance Overall balance assessment: Needs assistance Sitting-balance support: Feet supported Sitting balance-Leahy Scale: Good     Standing balance support: Bilateral upper extremity supported Standing balance-Leahy Scale: Good                               Pertinent Vitals/Pain Pain Assessment: No/denies pain    Home Living Family/patient expects to be discharged to:: Skilled nursing facility                 Additional Comments: most recently from SNF, however previously from indep living using rollater    Prior Function Level of Independence: Needs assistance         Comments: has been receiving rehab and ambulates with RW     Hand Dominance        Extremity/Trunk Assessment   Upper Extremity Assessment: Generalized weakness (B UE grossly 4/5)           Lower Extremity Assessment: Generalized weakness (  L LE grossly 3/5; R LE grossly 4/5)         Communication   Communication: No difficulties  Cognition Arousal/Alertness: Awake/alert Behavior During Therapy: WFL for tasks assessed/performed Overall Cognitive Status: Within Functional Limits for tasks assessed                      General Comments      Exercises Other Exercises Other Exercises: Seated ther-ex performed on B LE including LAQ, hip add squeezes, and SLRs. All ther-ex performed x 10 reps with cga and cues for correct technique.    Assessment/Plan    PT  Assessment Patient needs continued PT services  PT Problem List Decreased strength;Decreased mobility;Decreased knowledge of use of DME;Decreased safety awareness;Cardiopulmonary status limiting activity          PT Treatment Interventions DME instruction;Gait training;Therapeutic exercise    PT Goals (Current goals can be found in the Care Plan section)  Acute Rehab PT Goals Patient Stated Goal: to get stronger PT Goal Formulation: With patient Time For Goal Achievement: 03/22/16 Potential to Achieve Goals: Good    Frequency Min 2X/week   Barriers to discharge        Co-evaluation               End of Session Equipment Utilized During Treatment: Gait belt Activity Tolerance: Patient tolerated treatment well Patient left: in chair;with chair alarm set Nurse Communication: Mobility status         Time: [id number] PT Time Calculation (min) (ACUTE ONLY): 31 min   Charges:   PT Evaluation $PT Eval Moderate Complexity: 1 Procedure PT Treatments $Therapeutic Exercise: 8-22 mins   PT G Codes:        Ashutosh Dieguez 2016-04-04, 10:17 AM  [name], PT, DPT [phone]

## 2016-03-08 NOTE — Progress Notes (Signed)
I visited the patient upon request by his son who saw me as I was visiting other patients on the floor. I offered prayer and spiritual support and wished her a quick recovery.

## 2016-03-08 NOTE — Progress Notes (Signed)
Patient ID: LEXIA BALMES, female   DOB: 01-Aug-1924, 81 y.o.   MRN: [id number]    [facility] [place] NAME: Gilberte Nassar    MR#:  [id number]  DATE OF BIRTH:  May 30, 1925  SUBJECTIVE:  CHIEF COMPLAINT:   Chief Complaint  Patient presents with  . Anemia  . Leg Pain  Patient seen and examined at bedside. Postop day #1 status post incision and drainage of left hip hematoma. Patient reports mild pain which is well-controlled with her current regimen. She has been eating and drinking but has not yet been ambulating. She has urine and stool output. Physical therapy evaluation appreciated.  REVIEW OF SYSTEMS:  ROS CONSTITUTIONAL: No fever/chills, fatigue, weakness, weight gain/loss, headache. EYES: No blurry or double vision. ENT: No tinnitus, postnasal drip, redness or soreness of the oropharynx. RESPIRATORY: No cough, dyspnea, wheeze, hemoptysis.  CARDIOVASCULAR: No chest pain, palpitations, syncope, orthopnea,  GASTROINTESTINAL: No nausea, vomiting, constipation, diarrhea, abdominal pain, hematemesis, melena or hematochezia. GENITOURINARY: No dysuria, frequency, hematuria. ENDOCRINE: No polyuria or nocturia. No heat or cold intolerance. HEMATOLOGY: No anemia, bruising, bleeding. INTEGUMENTARY: No rashes, ulcers, lesions. MUSCULOSKELETAL: No arthritis, gout, dyspnea. Mild left hip pain. NEUROLOGIC: No numbness, tingling, ataxia, seizure-type activity, weakness. PSYCHIATRIC: No anxiety, depression, insomnia.   DRUG ALLERGIES:   Allergies  Allergen Reactions  . Morphine And Related Nausea And Vomiting  . Penicillins Other (See Comments)    Unknown Has patient had a PCN reaction causing immediate rash, facial/tongue/throat swelling, SOB or lightheadedness with hypotension: unsure Has patient had a PCN reaction causing severe rash involving mucus membranes or skin necrosis: unsure Has patient had a PCN reaction that required  hospitalization unsure Has patient had a PCN reaction occurring within the last 10 years: no If all of the above answers are "NO", then may proceed with Cephalosporin use.   VITALS:  Blood pressure (!) 131/41, pulse 64, temperature 98.1 F (36.7 C), temperature source Oral, resp. rate 18, height 5\' 4"  (1.626 m), weight 69.8 kg (153 lb 12.8 oz), SpO2 100 %. PHYSICAL EXAMINATION:  Physical Exam  PHYSICAL EXAMINATION: VITAL SIGNS: Blood pressure (!) 131/41, pulse 64, temperature 98.1 F (36.7 C), temperature source Oral, resp. rate 18, height 5\' 4"  (1.626 m), weight 69.8 kg (153 lb 12.8 oz), SpO2 100 %.  GENERAL: 82 y.o.-year-old white female patient, well-developed, well-nourished lying in the bed in no acute distress.  Pleasant and cooperative.   HEENT: Head atraumatic, normocephalic. Pupils equal, round, reactive to light and accommodation. No scleral icterus. Extraocular muscles intact. Nares are patent. Oropharynx is clear. Mucus membranes moist. NECK: Supple, full range of motion. No JVD, no bruit heard. No thyroid enlargement, no tenderness, no cervical lymphadenopathy. CHEST: Normal breath sounds bilaterally. No wheezing, rales, rhonchi or crackles. No use of accessory muscles of respiration.  No reproducible chest wall tenderness.  CARDIOVASCULAR: S1, S2 normal. No murmurs, rubs, or gallops. Cap refill <2 seconds. ABDOMEN: Soft, nontender, nondistended. No rebound, guarding, rigidity. Normoactive bowel sounds present in all four quadrants. No organomegaly or mass. EXTREMITIES: Clean and dry left hip wound with moderate tenderness to palpation. Drain in place No pedal edema, cyanosis, or clubbing. NEUROLOGIC: Cranial nerves II through XII are grossly intact with no focal sensorimotor deficit. Muscle strength 5/5 in all extremities. Sensation intact. Gait not checked. PSYCHIATRIC: The patient is alert and oriented x 3. Normal affect, mood, thought content. SKIN: Warm, dry, and intact  without obvious rash, lesion, or ulcer.  LABORATORY  PANEL:   CBC  Recent Labs Lab 03/07/16 0424 03/08/16 0338  WBC 11.5*  --   HGB 7.5* 8.7*  HCT 22.6*  --   PLT 419  --    ------------------------------------------------------------------------------------------------------------------ Chemistries   Recent Labs Lab 03/06/16 1120  NA 131*  K 4.6  CL 99*  CO2 26  GLUCOSE 109*  BUN 13  CREATININE 0.68  CALCIUM 8.1*  AST 29  ALT 11*  ALKPHOS 135*  BILITOT 1.2   RADIOLOGY:  No results found. ASSESSMENT AND PLAN:   Medications: I have reviewed the patient's current medications.  Assessment/Plan: 1. Acute Blood Loss Anemia: Secondary to hematoma. S/P transfusion. Hemoglobin improved from 7.5-8.7 today. We'll repeat CBC in a.m.  2. Hx DVT: Now off coumadin secondary to hematoma. Has history of multiple DVTs and has a family history of blood clots. I would restart anticoagulation as soon as it is safe to do so post op. We will discuss with surgery.  3. Hematoma: S/P evacuation.  4. HTN: Controlled.  5. Hyponatremia-we'll recheck BMP in a.m.  Physical therapy recommendations appreciated. We'll plan on discharge to skilled nursing facility for subacute rehabilitation.   All the records are reviewed and case discussed with Care Management/Social Worker. Management plans discussed with the patient, family and they are in agreement.  CODE STATUS: Full  TOTAL TIME TAKING CARE OF THIS PATIENT: 20 minutes.   More than 50% of the time was spent in counseling/coordination of care: YES  POSSIBLE D/C IN 1-2 DAYS, DEPENDING ON CLINICAL CONDITION.   Harvie Bridge M.D on 03/08/2016 at 12:18 PM  Between 7am to 6pm - Pager - [phone]  After 6pm go to [email or web address] - Proofreader  [employer] [facility]  Office  [phone]  CC: Primary care physician; Idelle Crouch, MD  Note: This dictation was prepared with Dragon dictation  along with smaller phrase technology. Any transcriptional errors that result from this process are unintentional.

## 2016-03-09 ENCOUNTER — Inpatient Hospital Stay: Payer: Medicare Other

## 2016-03-09 LAB — BASIC METABOLIC PANEL
ANION GAP: 4 — AB (ref 5–15)
BUN: 10 mg/dL (ref 6–20)
CALCIUM: 8 mg/dL — AB (ref 8.9–10.3)
CO2: 29 mmol/L (ref 22–32)
Chloride: 102 mmol/L (ref 101–111)
Creatinine, Ser: 0.43 mg/dL — ABNORMAL LOW (ref 0.44–1.00)
Glucose, Bld: 109 mg/dL — ABNORMAL HIGH (ref 65–99)
Potassium: 4.1 mmol/L (ref 3.5–5.1)
Sodium: 135 mmol/L (ref 135–145)

## 2016-03-09 LAB — CBC
HCT: 24.8 % — ABNORMAL LOW (ref 35.0–47.0)
HEMATOCRIT: 24.5 % — AB (ref 35.0–47.0)
HEMOGLOBIN: 8.4 g/dL — AB (ref 12.0–16.0)
Hemoglobin: 8.1 g/dL — ABNORMAL LOW (ref 12.0–16.0)
MCH: 28.1 pg (ref 26.0–34.0)
MCH: 28.7 pg (ref 26.0–34.0)
MCHC: 33 g/dL (ref 32.0–36.0)
MCHC: 33.9 g/dL (ref 32.0–36.0)
MCV: 85.3 fL (ref 80.0–100.0)
MCV: 84.6 fL (ref 80.0–100.0)
Platelets: 484 10*3/uL — ABNORMAL HIGH (ref 150–440)
Platelets: 497 10*3/uL — ABNORMAL HIGH (ref 150–440)
RBC: 2.87 MIL/uL — ABNORMAL LOW (ref 3.80–5.20)
RBC: 2.93 MIL/uL — AB (ref 3.80–5.20)
RDW: 16.7 % — AB (ref 11.5–14.5)
RDW: 16.7 % — ABNORMAL HIGH (ref 11.5–14.5)
WBC: 10.5 10*3/uL (ref 3.6–11.0)
WBC: 10.6 10*3/uL (ref 3.6–11.0)

## 2016-03-09 MED ORDER — MAGNESIUM HYDROXIDE 400 MG/5ML PO SUSP: 30.0000 mL | Freq: Every evening | ORAL | Status: DC | PRN

## 2016-03-09 MED ORDER — ENOXAPARIN SODIUM 40 MG/0.4ML ~~LOC~~ SOLN
40.0000 mg | SUBCUTANEOUS | Status: DC
  Administered 2016-03-10: 40 mg via SUBCUTANEOUS
  Filled 2016-03-09: qty 0.4

## 2016-03-09 MED ORDER — IPRATROPIUM-ALBUTEROL 0.5-2.5 (3) MG/3ML IN SOLN
3.0000 mL | Freq: Four times a day (QID) | RESPIRATORY_TRACT | Status: DC | PRN
Start: 2016-03-09 — End: 2016-03-10
  Administered 2016-03-09 – 2016-03-10 (×2): 3 mL via RESPIRATORY_TRACT
  Filled 2016-03-09 (×3): qty 3

## 2016-03-09 NOTE — Progress Notes (Signed)
Location:      Place of Service:  SNF (31) Provider:  Toni Arthurs, NP-C  SPARKS,JEFFREY D, MD  Patient Care Team: Idelle Crouch, MD as PCP - General (Internal Medicine)  Extended Emergency Contact Information Primary Emergency Contact: Driver,Laurel M Address: [address]          [place], [place] [address] [place] Phone: [phone] Relation: Daughter Secondary Emergency Contact: Ratterman,Gary  [place] Phone: [phone] Relation: Son   Code Status:  dnr Goals of care: Advanced Directive information Advanced Directives 03/06/2016  Does patient have an advance directive? Yes  Type of Advance Directive Living will;Healthcare Power of Attorney  Does patient want to make changes to advanced directive? No - Patient declined  Copy of advanced directive(s) in chart? Yes     Chief Complaint  Patient presents with  . Wound Check    HPI:  Pt is a 82 y.o. female seen today for for complaints of sudden increase in pain of the left hip s / P left hip fracture with surgical repair. Patient also reports increased and warmth with erythema of the skin. Patient also reports she feels what she describes as a "knot or bulge" over the incision. Patient is more than one week post-op. Pain typically should be resolving. Increased difficulty with ambulation. Patient appears to be experiencing an increase in depressive symptoms due to frustration. She feels she should be further along in her healing process at this point. Patient denies any nausea, vomiting, diarrhea, fever, chills, chest pain, shortness of breath, headache, abdominal pain, dizziness. Daughter is also very concerned. She has called the palliative care NP for guidance. Patient reports increase in pain despite being on scheduled oxycodone and Tylenol with PRN oxycodone. Patient visibly appears to have worsening pain symptoms than at previous assessment. Though, when asked, patient denies  worsened pain and simply states I'm fine. Unable to complete a physical assessment on patient due to Doppler studies being performed at this time.    Past Medical History:  Diagnosis Date  . Arthritis   . Asthma   . Cancer (HCC)    BREAST  . Cough    CHRONIC  . Depression   . Dizziness   . DVT (deep venous thrombosis) ([place])   . Edema    FEET/LEGS  . GERD (gastroesophageal reflux disease)   . Heart murmur   . Heart palpitations   . Hepatitis   . HOH (hard of hearing)   . Hyperlipidemia   . Hypertension   . Osteoporosis   . Shortness of breath dyspnea   . Tremors of nervous system    Past Surgical History:  Procedure Laterality Date  . APPENDECTOMY    . BREAST SURGERY     LUMPECTOMY  . CATARACT EXTRACTION W/PHACO Right 12/18/2015   Procedure: CATARACT EXTRACTION PHACO AND INTRAOCULAR LENS PLACEMENT (IOC);  Surgeon: Eulogio Bear, MD;  Location: [facility] ORS;  Service: Ophthalmology;  Laterality: Right;  [place] 2.00 AP% 14.5 CDE 17.61 Fluid Pack lot # [id number]  . CATARACT EXTRACTION W/PHACO Left 02/05/2016   Procedure: CATARACT EXTRACTION PHACO AND INTRAOCULAR LENS PLACEMENT (IOC);  Surgeon: Eulogio Bear, MD;  Location: [facility] ORS;  Service: Ophthalmology;  Laterality: Left;  [place] 01:23 AP% 8.1 CDE 6.83 Fluid  pack lot # [id number]  . CHOLECYSTECTOMY    . EXTERNAL EAR SURGERY    . EYE SURGERY    . HERNIA REPAIR    . INCISION AND DRAINAGE Left  03/07/2016   Procedure: INCISION AND DRAINAGE left hip and application of wound vac;  Surgeon: Hessie Knows, MD;  Location: [facility] ORS;  Service: Orthopedics;  Laterality: Left;  . INTRAMEDULLARY (IM) NAIL INTERTROCHANTERIC Left 02/11/2016   Procedure: INTRAMEDULLARY (IM) NAIL INTERTROCHANTRIC;  Surgeon: Dereck Leep, MD;  Location: [facility] ORS;  Service: Orthopedics;  Laterality: Left;  . IVC FILTER PLACEMENT ([facility] HX)    . KNEE ARTHROSCOPY    . MASTECTOMY Left   . NOSE SURGERY     CANCER  . TONSILLECTOMY    . TUBAL LIGATION       Allergies  Allergen Reactions  . Morphine And Related Nausea And Vomiting  . Penicillins Other (See Comments)    Unknown Has patient had a PCN reaction causing immediate rash, facial/tongue/throat swelling, SOB or lightheadedness with hypotension: unsure Has patient had a PCN reaction causing severe rash involving mucus membranes or skin necrosis: unsure Has patient had a PCN reaction that required hospitalization unsure Has patient had a PCN reaction occurring within the last 10 years: no If all of the above answers are "NO", then may proceed with Cephalosporin use.      Medication List    Notice   This visit is during an admission. Changes to the med list made in this visit will be reflected in the After Visit Summary of the admission.     Review of Systems  Constitutional: Positive for activity change. Negative for appetite change, chills, diaphoresis and fever.  HENT: Negative for congestion, sneezing, sore throat, trouble swallowing and voice change.   Respiratory: Negative for apnea, cough, choking, chest tightness, shortness of breath and wheezing.   Cardiovascular: Positive for leg swelling. Negative for chest pain and palpitations.  Gastrointestinal: Negative for abdominal distention, abdominal pain, anal bleeding, blood in stool, constipation, diarrhea and nausea.  Genitourinary: Negative for difficulty urinating, dysuria, frequency and urgency.  Musculoskeletal: Positive for arthralgias (typical arthritis) and joint swelling. Negative for back pain, gait problem and myalgias.  Skin: Positive for wound (Left hip incision). Negative for color change, pallor and rash.  Neurological: Positive for tremors. Negative for syncope, speech difficulty, weakness, numbness and headaches.  Psychiatric/Behavioral: Negative for agitation and behavioral problems. The patient is nervous/anxious.   All other systems reviewed and are negative.    There is no immunization history on  file for this patient. Pertinent  Health Maintenance Due  Topic Date Due  . PNA vac Low Risk Adult (1 of 2 - PCV13) 01/30/1992  . INFLUENZA VACCINE  01/13/2016  . DEXA SCAN  Completed   No flowsheet data found. Functional Status Survey:    Vitals:   03/05/16 0600  BP: (!) 165/45  Pulse: 80  Resp: (!) 22  Temp: 98 F (36.7 C)  SpO2: 98%   There is no height or weight on file to calculate BMI. Physical Exam  Constitutional: She appears well-developed and well-nourished. She appears distressed (pain).  HENT:  Head: Normocephalic and atraumatic.  Neck: No JVD present.  Pulmonary/Chest: Effort normal. No respiratory distress.  Musculoskeletal: She exhibits edema (Left Hip) and tenderness.  Skin: Skin is warm. There is erythema (warmth). No cyanosis. Nails show no clubbing.  Psychiatric: Her mood appears anxious. She exhibits a depressed mood.    Labs reviewed:  Recent Labs  02/19/16 1600  03/05/16 1930 03/06/16 1120 03/09/16 0637  NA 133*  < > 131* 131* 135  K 4.7  < > 4.2 4.6 4.1  CL 97*  < > 98*  99* 102  CO2 32  < > 29 26 29   GLUCOSE 121*  < > 111* 109* 109*  BUN 13  < > 16 13 10   CREATININE 0.56  < > 0.64 0.68 0.43*  CALCIUM 8.7*  < > 8.1* 8.1* 8.0*  MG 2.0  --   --   --   --   < > = values in this interval not displayed.  Recent Labs  03/03/16 0745 03/05/16 1930 03/06/16 1120  AST 17 20 29   ALT 10* 10* 11*  ALKPHOS 156* 133* 135*  BILITOT 0.4 0.9 1.2  PROT 5.9* 5.7* 5.9*  ALBUMIN 2.4* 2.2* 2.2*    Recent Labs  03/03/16 0745 03/05/16 1930 03/06/16 1120 03/07/16 0424 03/08/16 0338 03/09/16 0637 03/09/16 1500  WBC 12.1* 13.1* 15.5* 11.5*  --  10.5 10.6  NEUTROABS 7.9* 8.4* 10.9*  --   --   --   --   HGB 8.7* 7.0* 7.4* 7.5* 8.7* 8.1* 8.4*  HCT 26.3* 21.1* 21.8* 22.6*  --  24.5* 24.8*  MCV 86.3 85.9 85.3 83.2  --  85.3 84.6  PLT 435 401 468* 419  --  484* 497*   Lab Results  Component Value Date   TSH 2.178 02/19/2016   No results found  for: HGBA1C No results found for: CHOL, HDL, LDLCALC, LDLDIRECT, TRIG, CHOLHDL  Significant Diagnostic Results in last 30 days:  Dg Chest 1 View  Result Date: 03/09/2016 CLINICAL DATA:  Wheezing. Status post incision and drainage of left hip hematoma. EXAM: CHEST 1 VIEW COMPARISON:  02/10/2016 and 03/09/2016 at 0915 hours FINDINGS: Persistent linear density in the right lung extending from the right hilum. This linear density is probably associated with scarring and volume loss. Slightly increased densities in the right upper lung compared to the exam from 02/10/2016. However, there may be slightly improved aeration since the study earlier today. Coarse lung markings appear to be chronic. Again noted are surgical clips in the left axilla. Heart size is grossly within normal limits and stable. IMPRESSION: Slightly improved aeration in the right upper lung compared to the study earlier in the day. Findings may represent decreasing atelectasis or asymmetric edema. Chronic lung changes. Electronically Signed   By: Markus Daft M.D.   On: 03/09/2016 15:05   Dg Chest 1 View  Result Date: 03/09/2016 CLINICAL DATA:  Wheezing. EXAM: CHEST 1 VIEW COMPARISON:  02/10/2016.  CT 09/10/2014 FINDINGS: Right upper lobe and bibasilar subsegmental atelectasis and/or infiltrate. Linear density noted the right upper chest consistent with scarring again noted. Surgical clips noted over the right mid chest and left axilla. Cardiomegaly with normal pulmonary vascularity. No acute bony abnormality. IMPRESSION: 1. Right upper lobe and bibasilar subsegmental atelectasis and/or infiltrates. Follow-up chest x-rays recommended to demonstrate clearing. 2. Cardiomegaly with normal pulmonary vascularity. Electronically Signed   By: Marcello Moores  Register   On: 03/09/2016 09:39   Dg Chest 1 View  Result Date: 02/10/2016 CLINICAL DATA:  [place] yesterday with shoulder and hip pain EXAM: CHEST 1 VIEW COMPARISON:  CT chest of 09/10/2014 and chest  x-ray of 10/19/2011 FINDINGS: There is linear opacity in the right mid lung. This opacity likely is chronic although atelectasis cannot be excluded. Vague nodular opacities are present overlying the left anterior second and third ribs possibly related to small lung nodules noted on prior CT of the chest consistent with a chronic indolent infection. No pneumonia or effusion is currently seen. The heart is within normal limits in size. Surgical clips  overlie the left axilla. The bones are osteopenic. IMPRESSION: 1. No definite active process. 2. Linear atelectasis or scarring the right mid lung. 3. Vague nodular opacities overlie the left anterior second and third ribs possibly representing small nodules noted on prior CT of the chest. Electronically Signed   By: Ivar Drape M.D.   On: 02/10/2016 12:48   Ct Head Wo Contrast  Result Date: 02/10/2016 CLINICAL DATA:  Status post fall.  Left hip and left leg pain. EXAM: CT HEAD WITHOUT CONTRAST TECHNIQUE: Contiguous axial images were obtained from the base of the skull through the vertex without intravenous contrast. COMPARISON:  MR brain 09/12/2015 FINDINGS: Brain: No evidence of acute infarction, hemorrhage, extra-axial collection, ventriculomegaly, or mass effect. Generalized cerebral atrophy. Periventricular white matter low attenuation likely secondary to microangiopathy. Vascular: Cerebrovascular atherosclerotic calcifications are noted. Skull: Negative for fracture or focal lesion. Sinuses/Orbits: Visualized portions of the orbits are unremarkable. Visualized portions of the paranasal sinuses and mastoid air cells are unremarkable. Other: None. IMPRESSION: 1. No acute intracranial pathology. 2. Chronic microvascular disease and cerebral atrophy. Electronically Signed   By: Kathreen Devoid   On: 02/10/2016 12:06   US Pelvis Complete  Result Date: 03/06/2016 CLINICAL DATA:  Left-sided ovarian cystic lesion on CT. EXAM: TRANSABDOMINAL ULTRASOUND OF PELVIS  TECHNIQUE: Transabdominal ultrasound examination of the pelvis was performed including evaluation of the uterus, ovaries, adnexal regions, and pelvic cul-de-sac. COMPARISON:  CT of earlier today. FINDINGS: Uterus Measurements: 7.5 x 1.4 x 2.7 cm. Normal in morphology for age. Endometrium Thickness: On the order of 6-7 mm. No focal abnormality. Trace fluid is suspected within the endometrium of the uterine fundus on image 26. Right ovary Not visualized. A simple appearing cystic lesion is identified within the right adnexa and measures 4.6 x 3.7 x 3.7 cm. Left ovary Not visualized. No left-sided ovarian or adnexal cystic lesion is seen to correspond to the CT abnormality. Other findings:  No abnormal free fluid. IMPRESSION: 1. Exam performed transabdominally only at patient preference. 2. The left pelvic cystic lesion is not identified. This is simple in appearance on noncontrast CT. 3. Simple appearing right adnexal lesion is also identified measuring maximally 4.6 cm. Question peritoneal inclusion cyst or less likely ovarian cyst. Per consensus criteria, this warrants follow-up with pelvic ultrasound at 1 year. On this exam, recommend attention to the left hemipelvis as well. This recommendation follows the consensus statement: Management of Asymptomatic Ovarian and Other Adnexal Cysts Imaged at [place]: Society of Radiologists in [place]. Radiology 2010; [id number]. 4. Suspicion of trace fluid within the endometrium. Correlate with symptoms of postmenopausal bleeding. Presuming none, this is of doubtful significance. Electronically Signed   By: Abigail Miyamoto M.D.   On: 03/06/2016 16:31   Ct Femur Left W Contrast  Result Date: 03/06/2016 CLINICAL DATA:  Status post fixation of a left hip fracture 02/11/2016. Pain, swelling and discoloration about the left upper leg. EXAM: CT OF THE LEFT FEMUR WITH CONTRAST TECHNIQUE: Contiguous axial CT images were taken through the left upper leg  after IV contrast administration. Coronal and sagittal reformatted images are provided. CONTRAST:  100 ml ISOVUE-300 IOPAMIDOL (ISOVUE-300) INJECTION 61% COMPARISON:  Plain films left hip 02/10/2016. FINDINGS: The patient is status post fixation of a left intertrochanteric fracture with a hip screw and short intramedullary nail. In the lateral compartment of the left upper leg, there is a predominantly high attenuating collection measuring 31 cm craniocaudal by approximately 4.1 cm transverse by 9.6 cm AP. Scattered  fluid-fluid levels are identified within the collection. Immediately adjacent to the greater trochanter, a low attenuating collection measuring 5.6 cm craniocaudal by 2.4 cm transverse by 2.3 cm AP is seen. A collection in the subcutaneous fatty tissues at the level of the superior margin of the greater trochanter measures 1.7 cm AP by 4.3 cm transverse by 5.2 cm craniocaudal. It is also low attenuating. Heterotopic ossification or callus formation about the patient's fracture is best seen at the lesser trochanter. Hardware is intact. Screw in the hip abuts the cortex at the articular surface of the femoral head but does not definitely penetrated the cortex. The screw has migrated medially since the prior exam. Healing left superior and inferior pubic ramus fractures are identified. Degenerative disease about the hip and symphysis pubis there is noted. Intrapelvic contents demonstrate a cystic lesion in the right adnexa measuring 4.9 cm AP by 3.7 cm transverse by 4.6 cm craniocaudal. IMPRESSION: Findings consistent with a large hematoma extending the length of the lateral compartment of the left upper leg. Fluid-fluid levels are seen within the hematoma. The hematoma be superinfected but no specific finding to suggest infection is identified. Fluid collections off the right greater trochanter and in the subcutaneous fatty tissues just superior to the greater trochanter and do not appear to communicate.  Presumably these are postoperative seromas. Status post fixation of a left intertrochanteric fracture. Screw in the femoral head and neck has migrated medially and now abuts the cortex of the articular surface of the femoral head. Healing left inferior and superior pubic rami fractures. **An incidental finding of potential clinical significance has been found. Cystic lesion left adnexa cannot be definitively characterized. Pelvic ultrasound is recommended further evaluation.** Electronically Signed   By: Inge Rise M.D.   On: 03/06/2016 13:39   Dg Shoulder Left  Result Date: 02/10/2016 CLINICAL DATA:  Recent fall with left shoulder pain EXAM: LEFT SHOULDER - 2+ VIEW COMPARISON:  CT chest of 09/10/2014 FINDINGS: The bones are diffusely osteopenic. The left humeral head is in normal position with only mild degenerative joint disease for age. No fracture is seen. The left [facility] joint is normally aligned. The ribs that are visualized appear intact. Subtle nodularity in the left mid upper lung field may be due to chronic indolent infection as described on prior CT of the chest. IMPRESSION: Osteopenia.  No acute fracture. Electronically Signed   By: Ivar Drape M.D.   On: 02/10/2016 12:51   Dg Hip Operative Unilat W Or W/o Pelvis Left  Result Date: 02/11/2016 CLINICAL DATA:  ORIF left proximal femur fracture EXAM: OPERATIVE LEFT HIP (WITH PELVIS IF PERFORMED) 4 VIEWS TECHNIQUE: Fluoroscopic spot image(s) were submitted for interpretation post-operatively. COMPARISON:  02/10/2016 left hip radiograph FINDINGS: Fluoroscopy time 1 minutes 6 seconds. For spot fluoroscopic intraoperative nondiagnostic radiographs of the left hip demonstrate transfixation of the intertrochanteric left femur fracture with intra medullary rod and interlocking left femoral neck pain and distal interlocking screw. IMPRESSION: Intraoperative fluoroscopic guidance for ORIF left proximal femur fracture. Electronically Signed   By: Ilona Sorrel M.D.   On: 02/11/2016 17:38   Dg Hip Unilat With Pelvis 2-3 Views Left  Result Date: 02/10/2016 CLINICAL DATA:  Status post fall.  Left hip and shoulder pain. EXAM: DG HIP (WITH OR WITHOUT PELVIS) 2-3V LEFT COMPARISON:  None. FINDINGS: Comminuted left intertrochanteric fracture with mild displacement. No right hip fracture. No dislocation. Mild osteoarthritis of bilateral hips. Mild osteoarthritis of bilateral sacroiliac joints. IMPRESSION: Acute, comminuted left intertrochanteric  fracture. Electronically Signed   By: Kathreen Devoid   On: 02/10/2016 12:45    Assessment/Plan 1. Pain, acute postoperative  Repeat left lower extremity duplex Doppler rule out DVT.   Begin IV antibiotics for possible cellulitis.   Levaquin ivy 750 mg Q day time seven days.   CT scan of the left hip to rule out post op bleed, pathological fracture, Hardware loosening or sarcoma.   Labs, CBC, metc, CRP, ESR.   Continue ice packs to hip.   Plan discussed with palliative care NP who is an agreement. Palliative NP updated daughter.    Family/ staff Communication:   Total Time:  Documentation:  Face to Face:  Family/Phone: Palliative care spoke with daughter several times and updated her   Labs/tests ordered:  Cbc, met c, crp, esr, repeat doppler of LLE  Medication list reviewed and assessed for continued appropriateness. Monthly medication orders reviewed and signed.  Addendum... lab work revealed a hemoglobin of 7.0. Order placed in computer to send patient to [facility], non-emergent, in the morning after 8 a.m. meds and breakfast to receive two units of packed red blood cells transfusion. Also, while in the EDI, it would be preferred that patient get the CT scan and be evaluated by orthopedic surgeon. Spoke with daytime nursing about this. Instructed nursing to also send copies of the results of the dopplers and x-rays done with her to the Ed in case she is able to be evaluated by Orthopedics.  Nursing updated daughter of the plan. Daughter is in agreement, and transported patient to the emergency room as planned via private vehicle. Nursing states patient made the comment that she is giving up and is tired of all this. Nursing also reports that daughter's response to this was for the patient to get the blood, and see how she feels after that. Then they can talk about her decisions.  Vikki Ports, NP-C Geriatrics [facility] [phone], [place] [address] Cell Phone (Mon-Fri 8am-5pm):  [phone] On Call:  [phone] & follow prompts after 5pm & weekends Office Phone:  [phone] Office Fax:  [phone]

## 2016-03-09 NOTE — Progress Notes (Signed)
Clinical Education officer, museum (CSW) sent wound vac orders to [facility].   [employer], LCSW [phone]

## 2016-03-09 NOTE — Progress Notes (Signed)
Patient is A&O x4. Up with assist x1 and walker to [facility]. LBM, this shift. HGB re-check 8.4, this PM. Wound vac in place, 25cc of drainage. Dressing intact to left hip. No c/o pain. Exp wheezes to upper lobes, on 2L O2, sats 95-100%. Desats with activity. Fatigued today. IV fluids running at 50cc/hr. VSS. Started PRN nebs, with good results. Bed alarm on for safety. Family at bedside all day.

## 2016-03-09 NOTE — Progress Notes (Signed)
Chaplain visited the patient while making rounds on the floor. Patient's son saw the chaplain and asked for prayers. Chaplain offered prayers and spiritual support for the patient and her family.

## 2016-03-09 NOTE — Progress Notes (Signed)
Physical Therapy Treatment Patient Details Name: [name] MRN: [id number] DOB: 22-May-1925 Today's Date: 03/09/2016    History of Present Illness Pt admitted for large hematoma in lateral compartment of L LE. Pt went for I&D on 9/24. History includes ORIF of L hip fx on 8/31 and healing sup/inf pubic rami fx. Pt also with arthritis and hx of breast cancer. Pt presents from SNF at this time.    PT Comments    Pt notes feeling weaker today with increased malaise and mild shortness of breath. Mild audible wheeze noted. Pt/family note MD checking Hgb, which is trending down, this afternoon; currently 8.1. Opted to remain in bed due to symptoms, labs, and pt request. Pt participates well with supine bed exercises with O2 saturation at 96% with exertion and heart rate in the low 80's. Pt provided with written exercise program to perform several times a day. Continue PT to progress strength, endurance to improve functional mobility as able.   Follow Up Recommendations  SNF     Equipment Recommendations       Recommendations for Other Services       Precautions / Restrictions Precautions Precautions: Fall Restrictions Weight Bearing Restrictions: Yes LLE Weight Bearing: Weight bearing as tolerated    Mobility  Bed Mobility               General bed mobility comments: Not tested; pt wished to stay in bed. Decreasing Hgb  Transfers                    Ambulation/Gait                 Stairs            Wheelchair Mobility    Modified Rankin (Stroke Patients Only)       Balance                                    Cognition Arousal/Alertness: Awake/alert (Feeling tired/general malaise, weak) Behavior During Therapy: WFL for tasks assessed/performed Overall Cognitive Status: Within Functional Limits for tasks assessed                      Exercises General Exercises - Lower Extremity Ankle Circles/Pumps: AAROM;AROM;20  reps;Both;Other (comment);Supine (Assit on L for full range) Quad Sets: Strengthening;Both;20 reps;Supine Gluteal Sets: Strengthening;Both;20 reps;Supine Short Arc Quad: AROM;Both;20 reps;Supine Heel Slides: AAROM;Both;20 reps;Supine Hip ABduction/ADduction: AROM;Both;Supine;10 reps (2 sets) Straight Leg Raises: Strengthening;Both;10 reps;Supine;AAROM (2 sets, AAROM for improved range on L) Other Exercises Other Exercises: Add'r squeeze 20x with pillow    General Comments        Pertinent Vitals/Pain Pain Assessment: No/denies pain    Home Living                      Prior Function            PT Goals (current goals can now be found in the care plan section) Progress towards PT goals: Progressing toward goals    Frequency    Min 2X/week      PT Plan Current plan remains appropriate    Co-evaluation             End of Session Equipment Utilized During Treatment: Oxygen Activity Tolerance: Patient tolerated treatment well;Patient limited by fatigue Patient left: in bed;with call bell/phone within reach;with bed alarm set;with family/visitor present;with  SCD's reapplied     Time: [id number] PT Time Calculation (min) (ACUTE ONLY): 28 min  Charges:  $Therapeutic Exercise: 23-37 mins                    G Codes:      Larae Grooms, PTA 03/09/2016, 11:27 AM

## 2016-03-09 NOTE — Progress Notes (Signed)
Subjective: 2 Days Post-Op Procedure(s) (LRB): INCISION AND DRAINAGE left hip and application of wound vac (Left) Patient reports pain as mild.   Patient is doing well. Feels more lethargic today. Plan is to go Skilled nursing facility after hospital stay. Negative for chest pain and shortness of breath Fever: no Gastrointestinal:Negative for nausea and vomiting  Objective: Vital signs in last 24 hours: Temp:  [97.5 F (36.4 C)-98.3 F (36.8 C)] 97.9 F (36.6 C) (09/26 0352) Pulse Rate:  [59-64] 63 (09/26 0352) Resp:  [18] 18 (09/26 0352) BP: (112-141)/(31-63) 141/54 (09/26 0352) SpO2:  [98 %-100 %] 100 % (09/26 0352)  Intake/Output from previous day:  Intake/Output Summary (Last 24 hours) at 03/09/16 0959 Last data filed at 03/09/16 0927  Gross per 24 hour  Intake          1750.83 ml  Output              825 ml  Net           925.83 ml    Intake/Output this shift: Total I/O In: -  Out: 100 [Urine:100]  Labs:  Recent Labs  03/06/16 1120 03/07/16 0424 03/08/16 0338 03/09/16 0637  HGB 7.4* 7.5* 8.7* 8.1*    Recent Labs  03/07/16 0424 03/09/16 0637  WBC 11.5* 10.5  RBC 2.71* 2.87*  HCT 22.6* 24.5*  PLT 419 484*    Recent Labs  03/06/16 1120 03/09/16 0637  NA 131* 135  K 4.6 4.1  CL 99* 102  CO2 26 29  BUN 13 10  CREATININE 0.68 0.43*  GLUCOSE 109* 109*  CALCIUM 8.1* 8.0*    Recent Labs  03/07/16 0424  INR 1.98     EXAM General - Patient is Alert, Appropriate and drowsy this AM but answers questions appropriately. Extremity - ABD soft Sensation intact distally Dorsiflexion/Plantar flexion intact Incision: Woundvac in place with mild drainage. No cellulitis present Dressing/Incision - No erythema present, Woundvac in place. 50 cc output 24 hrs. Motor Function - intact, moving foot and toes well on exam.    Past Medical History:  Diagnosis Date  . Arthritis   . Asthma   . Cancer (HCC)    BREAST  . Cough    CHRONIC  .  Depression   . Dizziness   . DVT (deep venous thrombosis) ([place])   . Edema    FEET/LEGS  . GERD (gastroesophageal reflux disease)   . Heart murmur   . Heart palpitations   . Hepatitis   . HOH (hard of hearing)   . Hyperlipidemia   . Hypertension   . Osteoporosis   . Shortness of breath dyspnea   . Tremors of nervous system     Assessment/Plan: 2 Days Post-Op Procedure(s) (LRB): INCISION AND DRAINAGE left hip and application of wound vac (Left) Active Problems:   Abscess of left thigh  Estimated body mass index is 26.4 kg/m as calculated from the following:   Height as of this encounter: 5\' 4"  (1.626 m).   Weight as of this encounter: 69.8 kg (153 lb 12.8 oz). Advance diet Up with therapy   Up with PT today, PT recommending SNF Continue Woundvac at this time, will need to continue at [facility].  Acute post op blood loss anemia - Hgb trending down, will recheck this pm. May need 2nd transfusion. Cultures from left hip still pending, will continue Cleocin at this time.  DVT Prophylaxis - Lovenox, Foot Pumps and TED hose Weight-Bearing as tolerated to left leg  Duanne Guess, PA-C [facility] Orthopaedic Surgery 03/09/2016, 9:59 AM

## 2016-03-09 NOTE — Progress Notes (Signed)
Pt remaining alert and oriented. No complaints of pain during the night. Iv infusing without difficulty. Wound vac without any new drainage during the night, and functioning without difficulty. Pt able to sleep in between care.

## 2016-03-09 NOTE — Progress Notes (Addendum)
Patient ID: AYZIA LEWERENZ, female   DOB: Oct 04, 1924, 82 y.o.   MRN: [id number]   [place] [facility] [place] NAME: [name]    MR#:  [id number]  DATE OF BIRTH:  May 13, 1925  SUBJECTIVE:  CHIEF COMPLAINT:   Chief Complaint  Patient presents with  . Anemia  . Leg Pain  Patient seen and examined at bedside. Postop day #2 status post incision and drainage of left hip hematoma. Patient reports Increased fatigue, lethargy today. She denies any chest pain, shortness of breath. She has been eating and drinking but has not yet been ambulating or transferring to chair.. She has urine and stool output. Ortho and physical therapy evaluation appreciated.  REVIEW OF SYSTEMS:  ROS  CONSTITUTIONAL: No fever/chills, positive fatigue, positive weakness, negative weight gain/loss, headache. EYES: No blurry or double vision. ENT: No tinnitus, postnasal drip, redness or soreness of the oropharynx. RESPIRATORY: No cough, dyspnea, wheeze, hemoptysis.  CARDIOVASCULAR: No chest pain, palpitations, syncope, orthopnea,  GASTROINTESTINAL: No nausea, vomiting, constipation, diarrhea, abdominal pain, hematemesis, melena or hematochezia. GENITOURINARY: No dysuria, frequency, hematuria. ENDOCRINE: No polyuria or nocturia. No heat or cold intolerance. HEMATOLOGY: No anemia, bruising, bleeding. INTEGUMENTARY: No rashes, ulcers, lesions. MUSCULOSKELETAL: No arthritis, gout, dyspnea.  NEUROLOGIC: No numbness, tingling, ataxia, seizure-type activity, weakness. PSYCHIATRIC: No anxiety, depression, insomnia.    DRUG ALLERGIES:   Allergies  Allergen Reactions  . Morphine And Related Nausea And Vomiting  . Penicillins Other (See Comments)    Unknown Has patient had a PCN reaction causing immediate rash, facial/tongue/throat swelling, SOB or lightheadedness with hypotension: unsure Has patient had a PCN reaction causing severe rash involving mucus membranes or skin  necrosis: unsure Has patient had a PCN reaction that required hospitalization unsure Has patient had a PCN reaction occurring within the last 10 years: no If all of the above answers are "NO", then may proceed with Cephalosporin use.   VITALS:  Blood pressure (!) 131/45, pulse 69, temperature 98.7 F (37.1 C), temperature source Oral, resp. rate 20, height 5\' 4"  (1.626 m), weight 69.8 kg (153 lb 12.8 oz), SpO2 98 %. PHYSICAL EXAMINATION:  Physical Exam  GENERAL: 82 y.o.-year-old pale white female patient, well-developed, well-nourished lying in the bed in no acute distress.  Pleasant and cooperative.   HEENT: Head atraumatic, normocephalic. Pupils equal, round, reactive to light and accommodation. No scleral icterus. Extraocular muscles intact. Nares are patent. Oropharynx is clear. Mucus membranes moist. NECK: Supple, full range of motion. No JVD, no bruit heard. No thyroid enlargement, no tenderness, no cervical lymphadenopathy. CHEST: Patient with mild bibasilar crackles and scattered expiratory wheezes. No use of accessory muscles of respiration.  No reproducible chest wall tenderness.  CARDIOVASCULAR: S1, S2 normal. No murmurs, rubs, or gallops. Cap refill <2 seconds. ABDOMEN: Soft, nontender, nondistended. No rebound, guarding, rigidity. Normoactive bowel sounds present in all four quadrants. No organomegaly or mass. EXTREMITIES: Clean and dry left hip wound , nontender. Drain in place No pedal edema, cyanosis, or clubbing. NEUROLOGIC: Cranial nerves II through XII are grossly intact with no focal sensorimotor deficit. Muscle strength 5/5 in all extremities. Sensation intact. Gait not checked. PSYCHIATRIC: The patient is alert and oriented x 3. Normal affect, mood, thought content. SKIN: Warm, dry, and intact without obvious rash, lesion, or ulcer. LABORATORY PANEL:   CBC  Recent Labs Lab 03/09/16 0637  WBC 10.5  HGB 8.1*  HCT 24.5*  PLT 484*    ------------------------------------------------------------------------------------------------------------------ Chemistries   Recent Labs Lab  03/06/16 1120 03/09/16 0637  NA 131* 135  K 4.6 4.1  CL 99* 102  CO2 26 29  GLUCOSE 109* 109*  BUN 13 10  CREATININE 0.68 0.43*  CALCIUM 8.1* 8.0*  AST 29  --   ALT 11*  --   ALKPHOS 135*  --   BILITOT 1.2  --    RADIOLOGY:  Dg Chest 1 View  Result Date: 03/09/2016 CLINICAL DATA:  Wheezing. EXAM: CHEST 1 VIEW COMPARISON:  02/10/2016.  CT 09/10/2014 FINDINGS: Right upper lobe and bibasilar subsegmental atelectasis and/or infiltrate. Linear density noted the right upper chest consistent with scarring again noted. Surgical clips noted over the right mid chest and left axilla. Cardiomegaly with normal pulmonary vascularity. No acute bony abnormality. IMPRESSION: 1. Right upper lobe and bibasilar subsegmental atelectasis and/or infiltrates. Follow-up chest x-rays recommended to demonstrate clearing. 2. Cardiomegaly with normal pulmonary vascularity. Electronically Signed   By: Marcello Moores  Register   On: 03/09/2016 09:39   ASSESSMENT AND PLAN:   Assessment/Plan: 1. Acute Blood Loss Anemia: Secondary to hematoma. S/P transfusion. Hemoglobin trending down today. Patient feeling more lethargic fatigued and pale. We will check evening CBC and order transfusion as needed.  2. Right upper lobe and bibasilar subsegmental atelectasis and/or infiltrates. We'll provide nebulizer treatments, O2 therapy and repeat chest x-ray in a.m. Incentive spirometry also ordered.  2. Hx DVT: Now off coumadin secondary to hematoma. Has history of multiple DVTs and has a family history of blood clots. I would restart anticoagulation as soon as it is safe to do so post op. We will discuss with surgery.  3. Hematoma: S/P evacuation.  4. HTN: Controlled.  5. Hyponatremia-resolved  Physical therapy recommendations appreciated. We'll plan on discharge to skilled  nursing facility for subacute rehabilitation.  All the records are reviewed and case discussed with Care Management/Social Worker. Management plans discussed with the patient, family and they are in agreement.  CODE STATUS: Full  TOTAL TIME TAKING CARE OF THIS PATIENT: 20 minutes.   More than 50% of the time was spent in counseling/coordination of care: YES  POSSIBLE D/C IN 1-2 DAYS, DEPENDING ON CLINICAL CONDITION.   Harvie Bridge M.D on 03/09/2016 at 12:51 PM  Between 7am to 6pm - Pager - [phone]  After 6pm go to [email or web address] - Proofreader  [facility]  Office  [phone]  CC: Primary care physician; Idelle Crouch, MD  Note: This dictation was prepared with Dragon dictation along with smaller phrase technology. Any transcriptional errors that result from this process are unintentional.

## 2016-03-10 LAB — CBC
HEMATOCRIT: 25.5 % — AB (ref 35.0–47.0)
HEMOGLOBIN: 8.6 g/dL — AB (ref 12.0–16.0)
MCH: 28.8 pg (ref 26.0–34.0)
MCHC: 33.6 g/dL (ref 32.0–36.0)
MCV: 85.7 fL (ref 80.0–100.0)
Platelets: 526 10*3/uL — ABNORMAL HIGH (ref 150–440)
RBC: 2.97 MIL/uL — ABNORMAL LOW (ref 3.80–5.20)
RDW: 16.4 % — ABNORMAL HIGH (ref 11.5–14.5)
WBC: 10.7 10*3/uL (ref 3.6–11.0)

## 2016-03-10 LAB — BASIC METABOLIC PANEL
Anion gap: 6 (ref 5–15)
BUN: 8 mg/dL (ref 6–20)
CHLORIDE: 103 mmol/L (ref 101–111)
CO2: 27 mmol/L (ref 22–32)
CREATININE: 0.42 mg/dL — AB (ref 0.44–1.00)
Calcium: 8.3 mg/dL — ABNORMAL LOW (ref 8.9–10.3)
GFR calc Af Amer: >60 mL/min (ref >60)
GFR calc non Af Amer: >60 mL/min (ref >60)
Glucose, Bld: 100 mg/dL — ABNORMAL HIGH (ref 65–99)
Potassium: 4.2 mmol/L (ref 3.5–5.1)
Sodium: 136 mmol/L (ref 135–145)

## 2016-03-10 MED ORDER — OXYCODONE HCL 5 MG PO TABS: 5.0000 mg | ORAL_TABLET | ORAL | 0 refills | Status: DC | PRN

## 2016-03-10 MED ORDER — CLINDAMYCIN HCL 300 MG PO CAPS: 300.0000 mg | ORAL_CAPSULE | Freq: Three times a day (TID) | ORAL | 0 refills | Status: DC

## 2016-03-10 MED ORDER — IPRATROPIUM-ALBUTEROL 0.5-2.5 (3) MG/3ML IN SOLN: 3.0000 mL | Freq: Four times a day (QID) | RESPIRATORY_TRACT | 0 refills | Status: DC | PRN

## 2016-03-10 NOTE — Progress Notes (Signed)
Subjective: 3 Days Post-Op Procedure(s) (LRB): INCISION AND DRAINAGE left hip and application of wound vac (Left) Patient reports pain as mild.   Patient is Drowsy this AM but reports more energy compared to yesterday. Plan is to go Skilled nursing facility after hospital stay. Negative for chest pain and shortness of breath Fever: no Gastrointestinal:Negative for nausea and vomiting  Objective: Vital signs in last 24 hours: Temp:  [97.5 F (36.4 C)-99 F (37.2 C)] 97.5 F (36.4 C) (09/27 0356) Pulse Rate:  [62-69] 68 (09/27 0356) Resp:  [16-20] 16 (09/27 0356) BP: (131-149)/(40-63) 148/63 (09/27 0356) SpO2:  [95 %-100 %] 98 % (09/27 0356)  Intake/Output from previous day:  Intake/Output Summary (Last 24 hours) at 03/10/16 0742 Last data filed at 03/10/16 0549  Gross per 24 hour  Intake          1773.33 ml  Output              100 ml  Net          1673.33 ml    Intake/Output this shift: No intake/output data recorded.  Labs:  Recent Labs  03/08/16 0338 03/09/16 0637 03/09/16 1500 03/10/16 0427  HGB 8.7* 8.1* 8.4* 8.6*    Recent Labs  03/09/16 1500 03/10/16 0427  WBC 10.6 10.7  RBC 2.93* 2.97*  HCT 24.8* 25.5*  PLT 497* 526*    Recent Labs  03/09/16 0637 03/10/16 0427  NA 135 136  K 4.1 4.2  CL 102 103  CO2 29 27  BUN 10 8  CREATININE 0.43* 0.42*  GLUCOSE 109* 100*  CALCIUM 8.0* 8.3*   No results for input(s): LABPT, INR in the last 72 hours.   EXAM General - Patient is Alert, Appropriate and drowsy this AM but answers questions appropriately. Extremity - ABD soft Sensation intact distally Dorsiflexion/Plantar flexion intact Incision: Woundvac in place with mild drainage. No cellulitis present Dressing/Incision - No erythema present, Woundvac in place, continued mild drainage. Motor Function - intact, moving foot and toes well on exam.   Past Medical History:  Diagnosis Date  . Arthritis   . Asthma   . Cancer (HCC)    BREAST  .  Cough    CHRONIC  . Depression   . Dizziness   . DVT (deep venous thrombosis) ([place])   . Edema    FEET/LEGS  . GERD (gastroesophageal reflux disease)   . Heart murmur   . Heart palpitations   . Hepatitis   . HOH (hard of hearing)   . Hyperlipidemia   . Hypertension   . Osteoporosis   . Shortness of breath dyspnea   . Tremors of nervous system     Assessment/Plan: 3 Days Post-Op Procedure(s) (LRB): INCISION AND DRAINAGE left hip and application of wound vac (Left) Active Problems:   Abscess of left thigh  Estimated body mass index is 26.4 kg/m as calculated from the following:   Height as of this encounter: 5\' 4"  (1.626 m).   Weight as of this encounter: 69.8 kg (153 lb 12.8 oz). Advance diet Up with therapy   Up with PT today, PT recommending SNF Continue Woundvac at this time, will need to [facility] at [facility].  Acute post op blood loss anemia - Small bump in Hg, 8.6 this AM compared to 8.4 yesterday afternoon.  K+ within normal range. Cultures from left hip demonstrate no growth after two days.  Remains on Cleocin, will discuss discharge with Dr. Rudene Christians later today. Recent CXR demonstrated atelectasis, encouraged  incentive spirometry,  On 1L of O2 right now, 98% on room air earlier this AM. Possible d/c to SNF later today.  DVT Prophylaxis - Lovenox, Foot Pumps and TED hose Weight-Bearing as tolerated to left leg  J. Cameron Proud, PA-C [facility] Orthopaedic Surgery 03/10/2016, 7:42 AM

## 2016-03-10 NOTE — Discharge Summary (Signed)
Physician Discharge Summary  Patient ID: KECHA LIDDIARD MRN: [id number] DOB/AGE: 1924-06-28 82 y.o.  Admit date: 03/06/2016 Discharge date: 03/10/2016  Admission Diagnoses:  Swelling [R60.9] Pain [R52] Left leg pain [M79.605] Left thigh hematoma vs. Possible abscess  Discharge Diagnoses: Patient Active Problem List   Diagnosis Date Noted  . Abscess of left thigh 03/06/2016  . Anxiety and depression 02/22/2016  . Essential tremor 02/22/2016  . 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 Benign paroxysmal positional vertigo 02/22/2016  . Dizziness 02/22/2016  . Closed left hip fracture ([place]) 02/10/2016  Left thigh hematoma vs. Possible abscess.  Past Medical History:  Diagnosis Date  . Arthritis   . Asthma   . Cancer (HCC)    BREAST  . Cough    CHRONIC  . Depression   . Dizziness   . DVT (deep venous thrombosis) ([place])   . Edema    FEET/LEGS  . GERD (gastroesophageal reflux disease)   . Heart murmur   . Heart palpitations   . Hepatitis   . HOH (hard of hearing)   . Hyperlipidemia   . Hypertension   . Osteoporosis   . Shortness of breath dyspnea   . Tremors of nervous system    Transfusion: 1 Unit of [facility]   Consultants (if any): Treatment Team:  Baxter Hire, MD  Discharged Condition: Improved  Hospital Course: [name] is an 82 y.o. female who was admitted 03/06/2016 with a diagnosis of a left thigh hematoma vs. Possible abscess and went to the operating room on 03/06/2016 - 03/07/2016 and underwent the above named procedures.   Woundvac applied to the left hip, Cultures have not shown any growth over the previous two days.   Surgeries: Procedure(s): INCISION AND DRAINAGE left hip and application of wound vac on 03/06/2016 - 03/07/2016 Patient tolerated the surgery well. Taken to PACU where she was stabilized and then transferred to the orthopedic floor.  Started on Lovenox 40mg  q 24  hrs, prior to admission, patient was on chronic Warfarin, will transition back to medication upon discharge. Foot pumps applied bilaterally at 80 mm. Heels elevated on bed with rolled towels. No evidence of DVT. Negative [place]. Physical therapy started on day #1 for gait training and transfer. OT started day #1 for ADL and assisted devices.  Patient received IV Clindamycin while hospitalized, received IVF while hospitalized as well.  Implants: None  She was given perioperative antibiotics:  Anti-infectives    Start     Dose/Rate Route Frequency Ordered Stop   03/10/16 0000  clindamycin (CLEOCIN) 300 MG capsule     300 mg Oral 3 times daily 03/10/16 0752     03/07/16 1400  clindamycin (CLEOCIN) IVPB 600 mg     600 mg 100 mL/hr over 30 Minutes Intravenous Every 6 hours 03/07/16 1204 03/09/16 1345    .  She was given sequential compression devices, early ambulation, and Lovenox for DVT prophylaxis.  She benefited maximally from the hospital stay and there were no complications.    Recent vital signs:  Vitals:   03/10/16 0356 03/10/16 0900  BP: (!) 148/63 (!) 143/48  Pulse: 68 73  Resp: 16 16  Temp: 97.5 F (36.4 C) 98.1 F (36.7 C)    Recent laboratory studies:  Lab Results  Component Value Date   HGB 8.6 (L) 03/10/2016   HGB 8.4 (L) 03/09/2016   HGB 8.1 (L) 03/09/2016   Lab Results  Component  Value Date   WBC 10.7 03/10/2016   PLT 526 (H) 03/10/2016   Lab Results  Component Value Date   INR 1.98 03/07/2016   Lab Results  Component Value Date   NA 136 03/10/2016   K 4.2 03/10/2016   CL 103 03/10/2016   CO2 27 03/10/2016   BUN 8 03/10/2016   CREATININE 0.42 (L) 03/10/2016   GLUCOSE 100 (H) 03/10/2016    Discharge Medications:     Medication List    TAKE these medications   acetaminophen 500 MG tablet Commonly known as:  TYLENOL Take 500 mg by mouth every 6 (six) hours as needed for mild pain.   clindamycin 300 MG capsule Commonly known as:   CLEOCIN Take 1 capsule (300 mg total) by mouth 3 (three) times daily.   DUREZOL 0.05 % Emul Generic drug:  Difluprednate Apply 1 drop to eye 2 (two) times daily. LEFT eye   ENSURE Take 237 mLs by mouth.   ferrous sulfate 325 (65 FE) MG tablet Take 1 tablet (325 mg total) by mouth 2 (two) times daily with a meal.   ipratropium-albuterol 0.5-2.5 (3) MG/3ML Soln Commonly known as:  DUONEB Take 3 mLs by nebulization every 6 (six) hours as needed.   LEVOFLOXACIN IV Inject 750 mg into the vein daily. For cellulitis   meclizine 12.5 MG tablet Commonly known as:  ANTIVERT Take 12.5 mg by mouth every 6 (six) hours as needed for dizziness.   methocarbamol 500 MG tablet Commonly known as:  ROBAXIN Take 500 mg by mouth every 6 (six) hours as needed for muscle spasms.   mirtazapine 7.5 MG tablet Commonly known as:  REMERON Take 7.5 mg by mouth at bedtime.   moxifloxacin 0.5 % ophthalmic solution Commonly known as:  VIGAMOX Place 1 drop into the left eye 4 (four) times daily.   oxyCODONE 5 MG immediate release tablet Commonly known as:  Oxy IR/ROXICODONE Take 1-2 tablets (5-10 mg total) by mouth every 4 (four) hours as needed for breakthrough pain ((for MODERATE breakthrough pain)).   propranolol ER 60 MG 24 hr capsule Commonly known as:  INDERAL LA Take 60 mg by mouth daily.   sennosides-docusate sodium 8.6-50 MG tablet Commonly known as:  SENOKOT-S Take 1 tablet by mouth 2 (two) times daily.   venlafaxine XR 75 MG 24 hr capsule Commonly known as:  EFFEXOR-XR Take 75 mg by mouth daily with breakfast.   vitamin B-12 1000 MCG tablet Commonly known as:  CYANOCOBALAMIN Take 1,000 mcg by mouth daily.   Vitamin D3 1000 units Caps Take 4 capsules by mouth daily.   warfarin 3 MG tablet Commonly known as:  COUMADIN Take 3 mg by mouth daily.   warfarin 1 MG tablet Commonly known as:  COUMADIN Take 0.5 mg by mouth daily.       Diagnostic Studies: Dg Chest 1  View  Result Date: 03/09/2016 CLINICAL DATA:  Wheezing. Status post incision and drainage of left hip hematoma. EXAM: CHEST 1 VIEW COMPARISON:  02/10/2016 and 03/09/2016 at 0915 hours FINDINGS: Persistent linear density in the right lung extending from the right hilum. This linear density is probably associated with scarring and volume loss. Slightly increased densities in the right upper lung compared to the exam from 02/10/2016. However, there may be slightly improved aeration since the study earlier today. Coarse lung markings appear to be chronic. Again noted are surgical clips in the left axilla. Heart size is grossly within normal limits and stable. IMPRESSION: Slightly improved  aeration in the right upper lung compared to the study earlier in the day. Findings may represent decreasing atelectasis or asymmetric edema. Chronic lung changes. Electronically Signed   By: Markus Daft M.D.   On: 03/09/2016 15:05   Dg Chest 1 View  Result Date: 03/09/2016 CLINICAL DATA:  Wheezing. EXAM: CHEST 1 VIEW COMPARISON:  02/10/2016.  CT 09/10/2014 FINDINGS: Right upper lobe and bibasilar subsegmental atelectasis and/or infiltrate. Linear density noted the right upper chest consistent with scarring again noted. Surgical clips noted over the right mid chest and left axilla. Cardiomegaly with normal pulmonary vascularity. No acute bony abnormality. IMPRESSION: 1. Right upper lobe and bibasilar subsegmental atelectasis and/or infiltrates. Follow-up chest x-rays recommended to demonstrate clearing. 2. Cardiomegaly with normal pulmonary vascularity. Electronically Signed   By: Marcello Moores  Register   On: 03/09/2016 09:39   Dg Chest 1 View  Result Date: 02/10/2016 CLINICAL DATA:  [place] yesterday with shoulder and hip pain EXAM: CHEST 1 VIEW COMPARISON:  CT chest of 09/10/2014 and chest x-ray of 10/19/2011 FINDINGS: There is linear opacity in the right mid lung. This opacity likely is chronic although atelectasis cannot be  excluded. Vague nodular opacities are present overlying the left anterior second and third ribs possibly related to small lung nodules noted on prior CT of the chest consistent with a chronic indolent infection. No pneumonia or effusion is currently seen. The heart is within normal limits in size. Surgical clips overlie the left axilla. The bones are osteopenic. IMPRESSION: 1. No definite active process. 2. Linear atelectasis or scarring the right mid lung. 3. Vague nodular opacities overlie the left anterior second and third ribs possibly representing small nodules noted on prior CT of the chest. Electronically Signed   By: Ivar Drape M.D.   On: 02/10/2016 12:48   Ct Head Wo Contrast  Result Date: 02/10/2016 CLINICAL DATA:  Status post fall.  Left hip and left leg pain. EXAM: CT HEAD WITHOUT CONTRAST TECHNIQUE: Contiguous axial images were obtained from the base of the skull through the vertex without intravenous contrast. COMPARISON:  MR brain 09/12/2015 FINDINGS: Brain: No evidence of acute infarction, hemorrhage, extra-axial collection, ventriculomegaly, or mass effect. Generalized cerebral atrophy. Periventricular white matter low attenuation likely secondary to microangiopathy. Vascular: Cerebrovascular atherosclerotic calcifications are noted. Skull: Negative for fracture or focal lesion. Sinuses/Orbits: Visualized portions of the orbits are unremarkable. Visualized portions of the paranasal sinuses and mastoid air cells are unremarkable. Other: None. IMPRESSION: 1. No acute intracranial pathology. 2. Chronic microvascular disease and cerebral atrophy. Electronically Signed   By: Kathreen Devoid   On: 02/10/2016 12:06   US Pelvis Complete  Result Date: 03/06/2016 CLINICAL DATA:  Left-sided ovarian cystic lesion on CT. EXAM: TRANSABDOMINAL ULTRASOUND OF PELVIS TECHNIQUE: Transabdominal ultrasound examination of the pelvis was performed including evaluation of the uterus, ovaries, adnexal regions, and  pelvic cul-de-sac. COMPARISON:  CT of earlier today. FINDINGS: Uterus Measurements: 7.5 x 1.4 x 2.7 cm. Normal in morphology for age. Endometrium Thickness: On the order of 6-7 mm. No focal abnormality. Trace fluid is suspected within the endometrium of the uterine fundus on image 26. Right ovary Not visualized. A simple appearing cystic lesion is identified within the right adnexa and measures 4.6 x 3.7 x 3.7 cm. Left ovary Not visualized. No left-sided ovarian or adnexal cystic lesion is seen to correspond to the CT abnormality. Other findings:  No abnormal free fluid. IMPRESSION: 1. Exam performed transabdominally only at patient preference. 2. The left pelvic cystic lesion is not  identified. This is simple in appearance on noncontrast CT. 3. Simple appearing right adnexal lesion is also identified measuring maximally 4.6 cm. Question peritoneal inclusion cyst or less likely ovarian cyst. Per consensus criteria, this warrants follow-up with pelvic ultrasound at 1 year. On this exam, recommend attention to the left hemipelvis as well. This recommendation follows the consensus statement: Management of Asymptomatic Ovarian and Other Adnexal Cysts Imaged at [place]: Society of Radiologists in [place]. Radiology 2010; [phone]. 4. Suspicion of trace fluid within the endometrium. Correlate with symptoms of postmenopausal bleeding. Presuming none, this is of doubtful significance. Electronically Signed   By: Abigail Miyamoto M.D.   On: 03/06/2016 16:31   Ct Femur Left W Contrast  Result Date: 03/06/2016 CLINICAL DATA:  Status post fixation of a left hip fracture 02/11/2016. Pain, swelling and discoloration about the left upper leg. EXAM: CT OF THE LEFT FEMUR WITH CONTRAST TECHNIQUE: Contiguous axial CT images were taken through the left upper leg after IV contrast administration. Coronal and sagittal reformatted images are provided. CONTRAST:  100 ml ISOVUE-300 IOPAMIDOL (ISOVUE-300)  INJECTION 61% COMPARISON:  Plain films left hip 02/10/2016. FINDINGS: The patient is status post fixation of a left intertrochanteric fracture with a hip screw and short intramedullary nail. In the lateral compartment of the left upper leg, there is a predominantly high attenuating collection measuring 31 cm craniocaudal by approximately 4.1 cm transverse by 9.6 cm AP. Scattered fluid-fluid levels are identified within the collection. Immediately adjacent to the greater trochanter, a low attenuating collection measuring 5.6 cm craniocaudal by 2.4 cm transverse by 2.3 cm AP is seen. A collection in the subcutaneous fatty tissues at the level of the superior margin of the greater trochanter measures 1.7 cm AP by 4.3 cm transverse by 5.2 cm craniocaudal. It is also low attenuating. Heterotopic ossification or callus formation about the patient's fracture is best seen at the lesser trochanter. Hardware is intact. Screw in the hip abuts the cortex at the articular surface of the femoral head but does not definitely penetrated the cortex. The screw has migrated medially since the prior exam. Healing left superior and inferior pubic ramus fractures are identified. Degenerative disease about the hip and symphysis pubis there is noted. Intrapelvic contents demonstrate a cystic lesion in the right adnexa measuring 4.9 cm AP by 3.7 cm transverse by 4.6 cm craniocaudal. IMPRESSION: Findings consistent with a large hematoma extending the length of the lateral compartment of the left upper leg. Fluid-fluid levels are seen within the hematoma. The hematoma be superinfected but no specific finding to suggest infection is identified. Fluid collections off the right greater trochanter and in the subcutaneous fatty tissues just superior to the greater trochanter and do not appear to communicate. Presumably these are postoperative seromas. Status post fixation of a left intertrochanteric fracture. Screw in the femoral head and neck has  migrated medially and now abuts the cortex of the articular surface of the femoral head. Healing left inferior and superior pubic rami fractures. **An incidental finding of potential clinical significance has been found. Cystic lesion left adnexa cannot be definitively characterized. Pelvic ultrasound is recommended further evaluation.** Electronically Signed   By: Inge Rise M.D.   On: 03/06/2016 13:39   Dg Shoulder Left  Result Date: 02/10/2016 CLINICAL DATA:  Recent fall with left shoulder pain EXAM: LEFT SHOULDER - 2+ VIEW COMPARISON:  CT chest of 09/10/2014 FINDINGS: The bones are diffusely osteopenic. The left humeral head is in normal position with only mild  degenerative joint disease for age. No fracture is seen. The left [facility] joint is normally aligned. The ribs that are visualized appear intact. Subtle nodularity in the left mid upper lung field may be due to chronic indolent infection as described on prior CT of the chest. IMPRESSION: Osteopenia.  No acute fracture. Electronically Signed   By: Ivar Drape M.D.   On: 02/10/2016 12:51   Dg Hip Operative Unilat W Or W/o Pelvis Left  Result Date: 02/11/2016 CLINICAL DATA:  ORIF left proximal femur fracture EXAM: OPERATIVE LEFT HIP (WITH PELVIS IF PERFORMED) 4 VIEWS TECHNIQUE: Fluoroscopic spot image(s) were submitted for interpretation post-operatively. COMPARISON:  02/10/2016 left hip radiograph FINDINGS: Fluoroscopy time 1 minutes 6 seconds. For spot fluoroscopic intraoperative nondiagnostic radiographs of the left hip demonstrate transfixation of the intertrochanteric left femur fracture with intra medullary rod and interlocking left femoral neck pain and distal interlocking screw. IMPRESSION: Intraoperative fluoroscopic guidance for ORIF left proximal femur fracture. Electronically Signed   By: Ilona Sorrel M.D.   On: 02/11/2016 17:38   Dg Hip Unilat With Pelvis 2-3 Views Left  Result Date: 02/10/2016 CLINICAL DATA:  Status post fall.  Left  hip and shoulder pain. EXAM: DG HIP (WITH OR WITHOUT PELVIS) 2-3V LEFT COMPARISON:  None. FINDINGS: Comminuted left intertrochanteric fracture with mild displacement. No right hip fracture. No dislocation. Mild osteoarthritis of bilateral hips. Mild osteoarthritis of bilateral sacroiliac joints. IMPRESSION: Acute, comminuted left intertrochanteric fracture. Electronically Signed   By: Kathreen Devoid   On: 02/10/2016 12:45   Disposition: 03-Skilled Nursing Facility.  Will transition the patient to oral clindamycin, plan will be for discharge today to SNF.  Follow-up on 03/19/16, keep woundvac on orders given to SNF to do daily changes.  INR checks as the patient will transition to warfarin.   Follow-up Information    MENZ,MICHAEL, MD Follow up on 03/19/2016.   Specialty:  Orthopedic Surgery Contact information: [place] [id number] [phone]            Signed: Judson Roch PA-C 03/10/2016, 11:34 AM

## 2016-03-10 NOTE — Progress Notes (Signed)
Patient is medically stable for D/C to [facility] today. Per [facility] admissions coordinator at [facility] patient will go to room 207-A. RN will call report at [phone] and arrange EMS after 2 pm, so wound vac can be delivered to [facility]. Clinical Education officer, museum (CSW) sent D/C orders to [employer] via [place]. Patient is aware of above. CSW contacted patient's daughter Berline Chough and made her aware of above. Please reconsult if future social work needs arise. CSW signing off.   [employer], LCSW [phone]

## 2016-03-10 NOTE — Progress Notes (Signed)
Patient is being discharged to [facility] via EMS. IVs removed with caths intact. Gave dose of Acetaminophen  before transport. Wound vac removed, gauze and Tagaderm applied to wound, per [employer]. LBM, this shift. Report called. Waiting for transport at this time. Family updated.

## 2016-03-10 NOTE — Discharge Instructions (Signed)
-  Keep Woundvac on left hip wound.  Order's placed to changed woundvac -Pt will need daily INR checks as she will be transitioning back to Warfarin. -Oxycodone as needed for pain, 4 day supply of Clindamycin prescribed, finish Abx regimen. -Follow-up with Dr. Rudene Christians at [place] on 03/19/16 for repeat evaluation.

## 2016-03-11 LAB — CBC WITH DIFFERENTIAL/PLATELET
BASOS PCT: 1 %
Basophils Absolute: 0.1 10*3/uL (ref 0–0.1)
EOS ABS: 0.5 10*3/uL (ref 0–0.7)
Eosinophils Relative: 5 %
HEMATOCRIT: 25.6 % — AB (ref 35.0–47.0)
HEMOGLOBIN: 8.7 g/dL — AB (ref 12.0–16.0)
Lymphocytes Relative: 20 %
Lymphs Abs: 2.2 10*3/uL (ref 1.0–3.6)
MCH: 28.6 pg (ref 26.0–34.0)
MCHC: 34 g/dL (ref 32.0–36.0)
MCV: 84 fL (ref 80.0–100.0)
MONOS PCT: 14 %
Monocytes Absolute: 1.6 10*3/uL — ABNORMAL HIGH (ref 0.2–0.9)
NEUTROS ABS: 6.6 10*3/uL — AB (ref 1.4–6.5)
NEUTROS PCT: 60 %
Platelets: 569 10*3/uL — ABNORMAL HIGH (ref 150–440)
RBC: 3.05 MIL/uL — AB (ref 3.80–5.20)
RDW: 16.3 % — ABNORMAL HIGH (ref 11.5–14.5)
WBC: 10.9 10*3/uL (ref 3.6–11.0)

## 2016-03-11 LAB — PROTIME-INR
INR: 1.37
PROTHROMBIN TIME: 17 s — AB (ref 11.4–15.2)

## 2016-03-12 LAB — AEROBIC/ANAEROBIC CULTURE W GRAM STAIN (SURGICAL/DEEP WOUND): Culture: NO GROWTH

## 2016-03-12 LAB — AEROBIC/ANAEROBIC CULTURE (SURGICAL/DEEP WOUND)
CULTURE: NO GROWTH
GRAM STAIN: NONE SEEN

## 2016-03-12 LAB — PROTIME-INR
INR: 1.36
Prothrombin Time: 16.9 seconds — ABNORMAL HIGH (ref 11.4–15.2)

## 2016-03-14 ENCOUNTER — Encounter
Admission: RE | Admit: 2016-03-14 | Discharge: 2016-03-14 | Disposition: A | Discharge status: Home / Self Care | Payer: Medicare Other | Source: Ambulatory Visit | Attending: Internal Medicine | Admitting: Internal Medicine

## 2016-03-15 ENCOUNTER — Non-Acute Institutional Stay (SKILLED_NURSING_FACILITY): Payer: Medicare Other | Admitting: Gerontology

## 2016-03-15 DIAGNOSIS — G8918 Other acute postprocedural pain: Secondary | ICD-10-CM | POA: Diagnosis not present

## 2016-03-15 DIAGNOSIS — L02416 Cutaneous abscess of left lower limb: Secondary | ICD-10-CM

## 2016-03-15 DIAGNOSIS — Z5181 Encounter for therapeutic drug level monitoring: Secondary | ICD-10-CM

## 2016-03-15 LAB — PROTIME-INR
INR: 1.58
PROTHROMBIN TIME: 19 s — AB (ref 11.4–15.2)

## 2016-03-15 NOTE — Progress Notes (Signed)
Location:      Place of Service:  SNF (31) Provider:  Toni Arthurs, NP-C  SPARKS,JEFFREY D, MD  Patient Care Team: Idelle Crouch, MD as PCP - General (Internal Medicine)  Extended Emergency Contact Information Primary Emergency Contact: Driver,Laurel M Address: [address]          [place], [place] [address] [place] Phone: [phone] Relation: Daughter Secondary Emergency Contact: Madole,Gary  [place] Phone: [phone] Relation: Son  Code Status:   DO NOT RESUSCITATE Goals of care: Advanced Directive information Advanced Directives 03/06/2016  Does patient have an advance directive? Yes  Type of Advance Directive Living will;Healthcare Power of Attorney  Does patient want to make changes to advanced directive? No - Patient declined  Copy of advanced directive(s) in chart? Yes     Chief Complaint  Patient presents with  . Medical Management of Chronic Issues    HPI:  Pt is a 82 y.o. female seen today for medical management of chronic issues.  Patient was admitted to the facility for rehabilitation status post fall resulting in left hip fracture with IM nailing. While in rehabilitation , patient continued to have worsening pain and swelling of the left leg. Multiple Dopplers negative. Hemoglobin decreased to 7.0. Patient was sent to the ED at [facility] for a blood transfusion and CT scan of the left hip. CT scan revealed large fluid collection ,hematoma versus abscess. Patient went to surgery and had an irrigation and debridement of the fluid collection. Cultures were negative. Patient was returned to facility with wound VAC in place. Patient reports pain is much improved , swelling is diminished , and patient is feeling much better and is able to progress with therapy. Patient denies pain at this time.  Patient is on chronic Coumadin therapy for A. Fib. INR was checked today. INR remains subtherapeutic.  Coumadin dose is that of the  dose she was on prior to admission to the hospital , with therapeutic iNR. Will maintain this dose  And monitor INR closely  Due to anti- microbial therapy with clindamycin.   Past Medical History:  Diagnosis Date  . Arthritis   . Asthma   . Cancer (HCC)    BREAST  . Cough    CHRONIC  . Depression   . Dizziness   . DVT (deep venous thrombosis) ([place])   . Edema    FEET/LEGS  . GERD (gastroesophageal reflux disease)   . Heart murmur   . Heart palpitations   . Hepatitis   . HOH (hard of hearing)   . Hyperlipidemia   . Hypertension   . Osteoporosis   . Shortness of breath dyspnea   . Tremors of nervous system    Past Surgical History:  Procedure Laterality Date  . APPENDECTOMY    . BREAST SURGERY     LUMPECTOMY  . CATARACT EXTRACTION W/PHACO Right 12/18/2015   Procedure: CATARACT EXTRACTION PHACO AND INTRAOCULAR LENS PLACEMENT (IOC);  Surgeon: Eulogio Bear, MD;  Location: [facility] ORS;  Service: Ophthalmology;  Laterality: Right;  [place] 2.00 AP% 14.5 CDE 17.61 Fluid Pack lot # [id number]  . CATARACT EXTRACTION W/PHACO Left 02/05/2016   Procedure: CATARACT EXTRACTION PHACO AND INTRAOCULAR LENS PLACEMENT (IOC);  Surgeon: Eulogio Bear, MD;  Location: [facility] ORS;  Service: Ophthalmology;  Laterality: Left;  [place] 01:23 AP% 8.1 CDE 6.83 Fluid  pack lot # [id number]  . CHOLECYSTECTOMY    . EXTERNAL EAR SURGERY    . EYE  SURGERY    . HERNIA REPAIR    . INCISION AND DRAINAGE Left 03/07/2016   Procedure: INCISION AND DRAINAGE left hip and application of wound vac;  Surgeon: Hessie Knows, MD;  Location: [facility] ORS;  Service: Orthopedics;  Laterality: Left;  . INTRAMEDULLARY (IM) NAIL INTERTROCHANTERIC Left 02/11/2016   Procedure: INTRAMEDULLARY (IM) NAIL INTERTROCHANTRIC;  Surgeon: Dereck Leep, MD;  Location: [facility] ORS;  Service: Orthopedics;  Laterality: Left;  . IVC FILTER PLACEMENT ([facility] HX)    . KNEE ARTHROSCOPY    . MASTECTOMY Left   . NOSE SURGERY     CANCER  . TONSILLECTOMY    .  TUBAL LIGATION      Allergies  Allergen Reactions  . Morphine And Related Nausea And Vomiting  . Penicillins Other (See Comments)    Unknown Has patient had a PCN reaction causing immediate rash, facial/tongue/throat swelling, SOB or lightheadedness with hypotension: unsure Has patient had a PCN reaction causing severe rash involving mucus membranes or skin necrosis: unsure Has patient had a PCN reaction that required hospitalization unsure Has patient had a PCN reaction occurring within the last 10 years: no If all of the above answers are "NO", then may proceed with Cephalosporin use.      Medication List       Accurate as of 03/15/16  6:18 PM. Always use your most recent med list.          acetaminophen 500 MG tablet Commonly known as:  TYLENOL Take 500 mg by mouth every 6 (six) hours as needed for mild pain.   clindamycin 300 MG capsule Commonly known as:  CLEOCIN Take 1 capsule (300 mg total) by mouth 3 (three) times daily.   DUREZOL 0.05 % Emul Generic drug:  Difluprednate Apply 1 drop to eye 2 (two) times daily. LEFT eye   ENSURE Take 237 mLs by mouth.   ferrous sulfate 325 (65 FE) MG tablet Take 1 tablet (325 mg total) by mouth 2 (two) times daily with a meal.   ipratropium-albuterol 0.5-2.5 (3) MG/3ML Soln Commonly known as:  DUONEB Take 3 mLs by nebulization every 6 (six) hours as needed.   LEVOFLOXACIN IV Inject 750 mg into the vein daily. For cellulitis   meclizine 12.5 MG tablet Commonly known as:  ANTIVERT Take 12.5 mg by mouth every 6 (six) hours as needed for dizziness.   methocarbamol 500 MG tablet Commonly known as:  ROBAXIN Take 500 mg by mouth every 6 (six) hours as needed for muscle spasms.   mirtazapine 7.5 MG tablet Commonly known as:  REMERON Take 7.5 mg by mouth at bedtime.   moxifloxacin 0.5 % ophthalmic solution Commonly known as:  VIGAMOX Place 1 drop into the left eye 4 (four) times daily.   oxyCODONE 5 MG immediate release  tablet Commonly known as:  Oxy IR/ROXICODONE Take 1-2 tablets (5-10 mg total) by mouth every 4 (four) hours as needed for breakthrough pain ((for MODERATE breakthrough pain)).   propranolol ER 60 MG 24 hr capsule Commonly known as:  INDERAL LA Take 60 mg by mouth daily.   sennosides-docusate sodium 8.6-50 MG tablet Commonly known as:  SENOKOT-S Take 1 tablet by mouth 2 (two) times daily.   venlafaxine XR 75 MG 24 hr capsule Commonly known as:  EFFEXOR-XR Take 75 mg by mouth daily with breakfast.   vitamin B-12 1000 MCG tablet Commonly known as:  CYANOCOBALAMIN Take 1,000 mcg by mouth daily.   Vitamin D3 1000 units Caps Take 4 capsules  by mouth daily.   warfarin 3 MG tablet Commonly known as:  COUMADIN Take 3 mg by mouth daily.   warfarin 1 MG tablet Commonly known as:  COUMADIN Take 0.5 mg by mouth daily.       Review of Systems  Constitutional: Negative for activity change, appetite change, chills, diaphoresis and fever.  HENT: Negative for congestion, sneezing, sore throat, trouble swallowing and voice change.   Respiratory: Negative for apnea, cough, choking, chest tightness, shortness of breath and wheezing.   Cardiovascular: Positive for leg swelling. Negative for chest pain and palpitations.  Gastrointestinal: Negative for abdominal distention, abdominal pain, anal bleeding, blood in stool, constipation, diarrhea and nausea.  Genitourinary: Negative for difficulty urinating, dysuria, frequency and urgency.  Musculoskeletal: Positive for arthralgias (typical arthritis) and joint swelling. Negative for back pain, gait problem and myalgias.  Skin: Positive for wound (Left hip incision). Negative for color change, pallor and rash.  Neurological: Positive for tremors. Negative for syncope, speech difficulty, weakness, numbness and headaches.  Psychiatric/Behavioral: Negative for agitation and behavioral problems. The patient is nervous/anxious.   All other systems  reviewed and are negative.    There is no immunization history on file for this patient. Pertinent  Health Maintenance Due  Topic Date Due  . PNA vac Low Risk Adult (1 of 2 - PCV13) 01/30/1992  . INFLUENZA VACCINE  01/13/2016  . DEXA SCAN  Completed   No flowsheet data found. Functional Status Survey:    Vitals:   03/15/16 0500  BP: (!) 154/60  Pulse: 71  Resp: 20  Temp: 98.3 F (36.8 C)  SpO2: 97%  Weight: 152 lb 1.6 oz (69 kg)   Body mass index is 26.11 kg/m. Physical Exam  Constitutional: She appears well-developed and well-nourished. No distress (pain).  HENT:  Head: Normocephalic and atraumatic.  Neck: No JVD present.  Cardiovascular: Normal rate.  An irregular rhythm present. Exam reveals no gallop and no friction rub.   No murmur heard. Pulses:      Dorsalis pedis pulses are 2+ on the right side, and 2+ on the left side.  Pulmonary/Chest: Effort normal and breath sounds normal. No respiratory distress. She has no decreased breath sounds. She has no wheezes. She has no rhonchi. She has no rales. She exhibits no tenderness.  Abdominal: Soft. Normal appearance and bowel sounds are normal.  Musculoskeletal: She exhibits no edema (Left Hip) or tenderness.       Left hip: She exhibits decreased range of motion, decreased strength and swelling ( wound VAC in place).  Skin: Skin is warm and dry. Laceration ( incision) noted. She is not diaphoretic. No cyanosis or erythema (warmth). No pallor. Nails show no clubbing.   Wound VAC in place to left hip  Psychiatric: Her mood appears anxious. She exhibits a depressed mood.    Labs reviewed:  Recent Labs  02/19/16 1600  03/06/16 1120 03/09/16 0637 03/10/16 0427  NA 133*  < > 131* 135 136  K 4.7  < > 4.6 4.1 4.2  CL 97*  < > 99* 102 103  CO2 32  < > 26 29 27   GLUCOSE 121*  < > 109* 109* 100*  BUN 13  < > 13 10 8   CREATININE 0.56  < > 0.68 0.43* 0.42*  CALCIUM 8.7*  < > 8.1* 8.0* 8.3*  MG 2.0  --   --   --   --     < > = values in this interval not displayed.  Recent Labs  03/03/16 0745 03/05/16 1930 03/06/16 1120  AST 17 20 29   ALT 10* 10* 11*  ALKPHOS 156* 133* 135*  BILITOT 0.4 0.9 1.2  PROT 5.9* 5.7* 5.9*  ALBUMIN 2.4* 2.2* 2.2*    Recent Labs  03/05/16 1930 03/06/16 1120  03/09/16 1500 03/10/16 0427 03/11/16 0700  WBC 13.1* 15.5*  < > 10.6 10.7 10.9  NEUTROABS 8.4* 10.9*  --   --   --  6.6*  HGB 7.0* 7.4*  < > 8.4* 8.6* 8.7*  HCT 21.1* 21.8*  < > 24.8* 25.5* 25.6*  MCV 85.9 85.3  < > 84.6 85.7 84.0  PLT 401 468*  < > 497* 526* 569*  < > = values in this interval not displayed. Lab Results  Component Value Date   TSH 2.178 02/19/2016   No results found for: HGBA1C No results found for: CHOL, HDL, LDLCALC, LDLDIRECT, TRIG, CHOLHDL  Significant Diagnostic Results in last 30 days:  Dg Chest 1 View  Result Date: 03/09/2016 CLINICAL DATA:  Wheezing. Status post incision and drainage of left hip hematoma. EXAM: CHEST 1 VIEW COMPARISON:  02/10/2016 and 03/09/2016 at 0915 hours FINDINGS: Persistent linear density in the right lung extending from the right hilum. This linear density is probably associated with scarring and volume loss. Slightly increased densities in the right upper lung compared to the exam from 02/10/2016. However, there may be slightly improved aeration since the study earlier today. Coarse lung markings appear to be chronic. Again noted are surgical clips in the left axilla. Heart size is grossly within normal limits and stable. IMPRESSION: Slightly improved aeration in the right upper lung compared to the study earlier in the day. Findings may represent decreasing atelectasis or asymmetric edema. Chronic lung changes. Electronically Signed   By: Markus Daft M.D.   On: 03/09/2016 15:05   Dg Chest 1 View  Result Date: 03/09/2016 CLINICAL DATA:  Wheezing. EXAM: CHEST 1 VIEW COMPARISON:  02/10/2016.  CT 09/10/2014 FINDINGS: Right upper lobe and bibasilar subsegmental  atelectasis and/or infiltrate. Linear density noted the right upper chest consistent with scarring again noted. Surgical clips noted over the right mid chest and left axilla. Cardiomegaly with normal pulmonary vascularity. No acute bony abnormality. IMPRESSION: 1. Right upper lobe and bibasilar subsegmental atelectasis and/or infiltrates. Follow-up chest x-rays recommended to demonstrate clearing. 2. Cardiomegaly with normal pulmonary vascularity. Electronically Signed   By: Marcello Moores  Register   On: 03/09/2016 09:39   US Pelvis Complete  Result Date: 03/06/2016 CLINICAL DATA:  Left-sided ovarian cystic lesion on CT. EXAM: TRANSABDOMINAL ULTRASOUND OF PELVIS TECHNIQUE: Transabdominal ultrasound examination of the pelvis was performed including evaluation of the uterus, ovaries, adnexal regions, and pelvic cul-de-sac. COMPARISON:  CT of earlier today. FINDINGS: Uterus Measurements: 7.5 x 1.4 x 2.7 cm. Normal in morphology for age. Endometrium Thickness: On the order of 6-7 mm. No focal abnormality. Trace fluid is suspected within the endometrium of the uterine fundus on image 26. Right ovary Not visualized. A simple appearing cystic lesion is identified within the right adnexa and measures 4.6 x 3.7 x 3.7 cm. Left ovary Not visualized. No left-sided ovarian or adnexal cystic lesion is seen to correspond to the CT abnormality. Other findings:  No abnormal free fluid. IMPRESSION: 1. Exam performed transabdominally only at patient preference. 2. The left pelvic cystic lesion is not identified. This is simple in appearance on noncontrast CT. 3. Simple appearing right adnexal lesion is also identified measuring maximally 4.6 cm. Question peritoneal inclusion  cyst or less likely ovarian cyst. Per consensus criteria, this warrants follow-up with pelvic ultrasound at 1 year. On this exam, recommend attention to the left hemipelvis as well. This recommendation follows the consensus statement: Management of Asymptomatic  Ovarian and Other Adnexal Cysts Imaged at [place]: Society of Radiologists in [place]. Radiology 2010; [id number]. 4. Suspicion of trace fluid within the endometrium. Correlate with symptoms of postmenopausal bleeding. Presuming none, this is of doubtful significance. Electronically Signed   By: Abigail Miyamoto M.D.   On: 03/06/2016 16:31   Ct Femur Left W Contrast  Result Date: 03/06/2016 CLINICAL DATA:  Status post fixation of a left hip fracture 02/11/2016. Pain, swelling and discoloration about the left upper leg. EXAM: CT OF THE LEFT FEMUR WITH CONTRAST TECHNIQUE: Contiguous axial CT images were taken through the left upper leg after IV contrast administration. Coronal and sagittal reformatted images are provided. CONTRAST:  100 ml ISOVUE-300 IOPAMIDOL (ISOVUE-300) INJECTION 61% COMPARISON:  Plain films left hip 02/10/2016. FINDINGS: The patient is status post fixation of a left intertrochanteric fracture with a hip screw and short intramedullary nail. In the lateral compartment of the left upper leg, there is a predominantly high attenuating collection measuring 31 cm craniocaudal by approximately 4.1 cm transverse by 9.6 cm AP. Scattered fluid-fluid levels are identified within the collection. Immediately adjacent to the greater trochanter, a low attenuating collection measuring 5.6 cm craniocaudal by 2.4 cm transverse by 2.3 cm AP is seen. A collection in the subcutaneous fatty tissues at the level of the superior margin of the greater trochanter measures 1.7 cm AP by 4.3 cm transverse by 5.2 cm craniocaudal. It is also low attenuating. Heterotopic ossification or callus formation about the patient's fracture is best seen at the lesser trochanter. Hardware is intact. Screw in the hip abuts the cortex at the articular surface of the femoral head but does not definitely penetrated the cortex. The screw has migrated medially since the prior exam. Healing left superior and inferior  pubic ramus fractures are identified. Degenerative disease about the hip and symphysis pubis there is noted. Intrapelvic contents demonstrate a cystic lesion in the right adnexa measuring 4.9 cm AP by 3.7 cm transverse by 4.6 cm craniocaudal. IMPRESSION: Findings consistent with a large hematoma extending the length of the lateral compartment of the left upper leg. Fluid-fluid levels are seen within the hematoma. The hematoma be superinfected but no specific finding to suggest infection is identified. Fluid collections off the right greater trochanter and in the subcutaneous fatty tissues just superior to the greater trochanter and do not appear to communicate. Presumably these are postoperative seromas. Status post fixation of a left intertrochanteric fracture. Screw in the femoral head and neck has migrated medially and now abuts the cortex of the articular surface of the femoral head. Healing left inferior and superior pubic rami fractures. **An incidental finding of potential clinical significance has been found. Cystic lesion left adnexa cannot be definitively characterized. Pelvic ultrasound is recommended further evaluation.** Electronically Signed   By: Inge Rise M.D.   On: 03/06/2016 13:39    Assessment/Plan 1. Abscess of left thigh   wound VAC 2 left hip at 1 25 mmHg continuous suction    change Monday Wednesday and Friday   continue 3 times a day clindamycin as prescribed by Hospital  2. Pain, acute postoperative   well-controlled  Continue oxycodone 5 mg 1-2 tabs by mouth every 4 hour when necessary pain  3. Encounter for therapeutic drug monitoring  Subtherapeutic INR of 1.58  Continue Coumadin 3.5 mg PO Q Day at 1700 for Atrial Fibrillation  Recheck INR 4 days  Monitor for s/s of bleeding, bruising, hematuria, hematemesis, melena  Check INR 3 days after initiation of antibiotic, when applicable.    Family/ staff Communication:   Total  Time:  Documentation:  Face to Face:  Family/Phone:   Labs/tests ordered:   CBC, PT/INR  Medication list reviewed and assessed for continued appropriateness. Monthly medication orders reviewed and signed.  Vikki Ports, NP-C Geriatrics [facility] [phone], [place] [address] Cell Phone (Mon-Fri 8am-5pm):  [phone] On Call:  [phone] & follow prompts after 5pm & weekends Office Phone:  [phone] Office Fax:  [phone]

## 2016-03-16 LAB — CBC WITH DIFFERENTIAL/PLATELET
BASOS ABS: 0.1 10*3/uL (ref 0–0.1)
BASOS PCT: 1 %
Eosinophils Absolute: 0.6 10*3/uL (ref 0–0.7)
Eosinophils Relative: 6 %
HEMATOCRIT: 28.3 % — AB (ref 35.0–47.0)
HEMOGLOBIN: 9.4 g/dL — AB (ref 12.0–16.0)
LYMPHS PCT: 24 %
Lymphs Abs: 2.8 10*3/uL (ref 1.0–3.6)
MCH: 28.3 pg (ref 26.0–34.0)
MCHC: 33.1 g/dL (ref 32.0–36.0)
MCV: 85.5 fL (ref 80.0–100.0)
Monocytes Absolute: 1.5 10*3/uL — ABNORMAL HIGH (ref 0.2–0.9)
Monocytes Relative: 13 %
NEUTROS ABS: 6.6 10*3/uL — AB (ref 1.4–6.5)
Neutrophils Relative %: 56 %
Platelets: 618 10*3/uL — ABNORMAL HIGH (ref 150–440)
RBC: 3.31 MIL/uL — AB (ref 3.80–5.20)
RDW: 17.3 % — ABNORMAL HIGH (ref 11.5–14.5)
WBC: 11.7 10*3/uL — AB (ref 3.6–11.0)

## 2016-03-16 LAB — PROTIME-INR
INR: 1.47
Prothrombin Time: 18 seconds — ABNORMAL HIGH (ref 11.4–15.2)

## 2016-03-17 LAB — PROTIME-INR
INR: 1.42
Prothrombin Time: 17.5 seconds — ABNORMAL HIGH (ref 11.4–15.2)

## 2016-03-19 LAB — PROTIME-INR
INR: 1.57
Prothrombin Time: 18.9 seconds — ABNORMAL HIGH (ref 11.4–15.2)

## 2016-03-22 LAB — PROTIME-INR
INR: 1.65
PROTHROMBIN TIME: 19.7 s — AB (ref 11.4–15.2)

## 2016-03-24 LAB — PROTIME-INR
INR: 1.86
Prothrombin Time: 21.7 seconds — ABNORMAL HIGH (ref 11.4–15.2)

## 2016-03-26 ENCOUNTER — Other Ambulatory Visit
Admission: RE | Admit: 2016-03-26 | Discharge: 2016-03-26 | Disposition: A | Discharge status: Home / Self Care | Payer: Medicare Other | Source: Skilled Nursing Facility | Attending: Internal Medicine | Admitting: Internal Medicine

## 2016-03-26 DIAGNOSIS — I82503 Chronic embolism and thrombosis of unspecified deep veins of lower extremity, bilateral: Secondary | ICD-10-CM | POA: Insufficient documentation

## 2016-03-26 LAB — PROTIME-INR
INR: 2.11
Prothrombin Time: 24 seconds — ABNORMAL HIGH (ref 11.4–15.2)

## 2016-03-29 ENCOUNTER — Other Ambulatory Visit
Admission: RE | Admit: 2016-03-29 | Discharge: 2016-03-29 | Disposition: A | Discharge status: Home / Self Care | Payer: Medicare Other | Source: Ambulatory Visit | Attending: Gerontology | Admitting: Gerontology

## 2016-03-29 DIAGNOSIS — I82503 Chronic embolism and thrombosis of unspecified deep veins of lower extremity, bilateral: Secondary | ICD-10-CM | POA: Diagnosis present

## 2016-03-29 LAB — PROTIME-INR
INR: 2.45
Prothrombin Time: 27 seconds — ABNORMAL HIGH (ref 11.4–15.2)

## 2016-03-30 DIAGNOSIS — Z515 Encounter for palliative care: Secondary | ICD-10-CM | POA: Diagnosis not present

## 2016-03-30 DIAGNOSIS — S72002S Fracture of unspecified part of neck of left femur, sequela: Secondary | ICD-10-CM

## 2016-03-30 DIAGNOSIS — W1830XS Fall on same level, unspecified, sequela: Secondary | ICD-10-CM | POA: Diagnosis not present

## 2016-03-31 ENCOUNTER — Non-Acute Institutional Stay (SKILLED_NURSING_FACILITY): Payer: Medicare Other | Admitting: Gerontology

## 2016-03-31 DIAGNOSIS — S72002D Fracture of unspecified part of neck of left femur, subsequent encounter for closed fracture with routine healing: Secondary | ICD-10-CM | POA: Diagnosis not present

## 2016-03-31 DIAGNOSIS — Z5181 Encounter for therapeutic drug level monitoring: Secondary | ICD-10-CM | POA: Diagnosis not present

## 2016-03-31 DIAGNOSIS — G8918 Other acute postprocedural pain: Secondary | ICD-10-CM

## 2016-03-31 DIAGNOSIS — I82503 Chronic embolism and thrombosis of unspecified deep veins of lower extremity, bilateral: Secondary | ICD-10-CM

## 2016-03-31 LAB — PROTIME-INR
INR: 2.65
PROTHROMBIN TIME: 28.8 s — AB (ref 11.4–15.2)

## 2016-03-31 NOTE — Progress Notes (Signed)
Location:      Place of Service:  SNF (31) Provider:  Toni Arthurs, NP-C  SPARKS,JEFFREY D, MD  Patient Care Team: Idelle Crouch, MD as PCP - General (Internal Medicine)  Extended Emergency Contact Information Primary Emergency Contact: Driver,Laurel M Address: [address]          [place], [place] [address] [place] Phone: [phone] Relation: Daughter Secondary Emergency Contact: Denny,Gary  [place] Phone: [phone] Relation: Son  Code Status:  full Goals of care: Advanced Directive information Advanced Directives 03/06/2016  Does patient have an advance directive? Yes  Type of Advance Directive Living will;Healthcare Power of Attorney  Does patient want to make changes to advanced directive? No - Patient declined  Copy of advanced directive(s) in chart? Yes     Chief Complaint  Patient presents with  . Medication Management    HPI:  Pt is a 82 y.o. female seen today for an acute visit for INR monitoring as well as follow up on pain management.  She denies consistent pain. Says it only hurts on occasion. When directly asked, she reports her pain as 0 out of 10. She denies spasms and cramps especially in her thigh.  INR today was 2.65, used  for management of Coumadin  for VTE Prophylaxis in the setting of afib/ chronic DVTs. Pt has been complaint with medication regimen and is aware of dietary modifications needed. Pt is aware of importance of continued compliance with medication and testing. Pt has not displayed any adverse effects related to anticoagulant therapy such as unexplained or excessive bleeding, bruising, hematuria, hematemesis, melena. Heart rate is controlled. Vital signs are stable.      Past Medical History:  Diagnosis Date  . Arthritis   . Asthma   . Cancer (HCC)    BREAST  . Cough    CHRONIC  . Depression   . Dizziness   . DVT (deep venous thrombosis) ([place])   . Edema    FEET/LEGS  . GERD  (gastroesophageal reflux disease)   . Heart murmur   . Heart palpitations   . Hepatitis   . HOH (hard of hearing)   . Hyperlipidemia   . Hypertension   . Osteoporosis   . Shortness of breath dyspnea   . Tremors of nervous system    Past Surgical History:  Procedure Laterality Date  . APPENDECTOMY    . BREAST SURGERY     LUMPECTOMY  . CATARACT EXTRACTION W/PHACO Right 12/18/2015   Procedure: CATARACT EXTRACTION PHACO AND INTRAOCULAR LENS PLACEMENT (IOC);  Surgeon: Eulogio Bear, MD;  Location: [facility] ORS;  Service: Ophthalmology;  Laterality: Right;  [place] 2.00 AP% 14.5 CDE 17.61 Fluid Pack lot # [id number]  . CATARACT EXTRACTION W/PHACO Left 02/05/2016   Procedure: CATARACT EXTRACTION PHACO AND INTRAOCULAR LENS PLACEMENT (IOC);  Surgeon: Eulogio Bear, MD;  Location: [facility] ORS;  Service: Ophthalmology;  Laterality: Left;  [place] 01:23 AP% 8.1 CDE 6.83 Fluid  pack lot # [id number]  . CHOLECYSTECTOMY    . EXTERNAL EAR SURGERY    . EYE SURGERY    . HERNIA REPAIR    . INCISION AND DRAINAGE Left 03/07/2016   Procedure: INCISION AND DRAINAGE left hip and application of wound vac;  Surgeon: Hessie Knows, MD;  Location: [facility] ORS;  Service: Orthopedics;  Laterality: Left;  . INTRAMEDULLARY (IM) NAIL INTERTROCHANTERIC Left 02/11/2016   Procedure: INTRAMEDULLARY (IM) NAIL INTERTROCHANTRIC;  Surgeon: Dereck Leep, MD;  Location: [facility]  ORS;  Service: Orthopedics;  Laterality: Left;  . IVC FILTER PLACEMENT ([facility] HX)    . KNEE ARTHROSCOPY    . MASTECTOMY Left   . NOSE SURGERY     CANCER  . TONSILLECTOMY    . TUBAL LIGATION      Allergies  Allergen Reactions  . Morphine And Related Nausea And Vomiting  . Penicillins Other (See Comments)    Unknown Has patient had a PCN reaction causing immediate rash, facial/tongue/throat swelling, SOB or lightheadedness with hypotension: unsure Has patient had a PCN reaction causing severe rash involving mucus membranes or skin necrosis: unsure Has patient  had a PCN reaction that required hospitalization unsure Has patient had a PCN reaction occurring within the last 10 years: no If all of the above answers are "NO", then may proceed with Cephalosporin use.      Medication List       Accurate as of 03/31/16  6:34 PM. Always use your most recent med list.          acetaminophen 500 MG tablet Commonly known as:  TYLENOL Take 500 mg by mouth every 6 (six) hours as needed for mild pain.   clindamycin 300 MG capsule Commonly known as:  CLEOCIN Take 1 capsule (300 mg total) by mouth 3 (three) times daily.   DUREZOL 0.05 % Emul Generic drug:  Difluprednate Apply 1 drop to eye 2 (two) times daily. LEFT eye   ENSURE Take 237 mLs by mouth.   ferrous sulfate 325 (65 FE) MG tablet Take 1 tablet (325 mg total) by mouth 2 (two) times daily with a meal.   ipratropium-albuterol 0.5-2.5 (3) MG/3ML Soln Commonly known as:  DUONEB Take 3 mLs by nebulization every 6 (six) hours as needed.   LEVOFLOXACIN IV Inject 750 mg into the vein daily. For cellulitis   meclizine 12.5 MG tablet Commonly known as:  ANTIVERT Take 12.5 mg by mouth every 6 (six) hours as needed for dizziness.   methocarbamol 500 MG tablet Commonly known as:  ROBAXIN Take 500 mg by mouth every 6 (six) hours as needed for muscle spasms.   mirtazapine 7.5 MG tablet Commonly known as:  REMERON Take 7.5 mg by mouth at bedtime.   moxifloxacin 0.5 % ophthalmic solution Commonly known as:  VIGAMOX Place 1 drop into the left eye 4 (four) times daily.   oxyCODONE 5 MG immediate release tablet Commonly known as:  Oxy IR/ROXICODONE Take 1-2 tablets (5-10 mg total) by mouth every 4 (four) hours as needed for breakthrough pain ((for MODERATE breakthrough pain)).   propranolol ER 60 MG 24 hr capsule Commonly known as:  INDERAL LA Take 60 mg by mouth daily.   sennosides-docusate sodium 8.6-50 MG tablet Commonly known as:  SENOKOT-S Take 1 tablet by mouth 2 (two) times  daily.   venlafaxine XR 75 MG 24 hr capsule Commonly known as:  EFFEXOR-XR Take 75 mg by mouth daily with breakfast.   vitamin B-12 1000 MCG tablet Commonly known as:  CYANOCOBALAMIN Take 1,000 mcg by mouth daily.   Vitamin D3 1000 units Caps Take 4 capsules by mouth daily.   warfarin 3 MG tablet Commonly known as:  COUMADIN Take 3 mg by mouth daily.   warfarin 1 MG tablet Commonly known as:  COUMADIN Take 0.5 mg by mouth daily.       Review of Systems  Constitutional: Negative for activity change, appetite change, chills, diaphoresis and fever.  HENT: Negative for congestion, sneezing, sore throat, trouble swallowing  and voice change.   Eyes: Negative for pain, redness and visual disturbance.  Respiratory: Negative for apnea, cough, choking, chest tightness, shortness of breath and wheezing.   Cardiovascular: Negative for chest pain, palpitations and leg swelling.  Gastrointestinal: Negative for abdominal distention, abdominal pain, anal bleeding, blood in stool, constipation, diarrhea and nausea.  Genitourinary: Negative for difficulty urinating, dysuria, frequency and urgency.  Musculoskeletal: Positive for arthralgias (typical arthritis) and joint swelling (minimal). Negative for back pain, gait problem and myalgias.  Skin: Positive for wound (Left hip incision). Negative for color change, pallor and rash.  Neurological: Negative for tremors, syncope, speech difficulty, weakness, numbness and headaches.  Psychiatric/Behavioral: Negative for agitation and behavioral problems. The patient is not nervous/anxious.   All other systems reviewed and are negative.    There is no immunization history on file for this patient. Pertinent  Health Maintenance Due  Topic Date Due  . PNA vac Low Risk Adult (1 of 2 - PCV13) 01/30/1992  . INFLUENZA VACCINE  01/13/2016  . DEXA SCAN  Completed   No flowsheet data found. Functional Status Survey:    Vitals:   03/31/16 0500  BP:  139/62  Pulse: 68  Resp: 16  Temp: 98.7 F (37.1 C)  SpO2: 96%   There is no height or weight on file to calculate BMI. Physical Exam  Constitutional: She is oriented to person, place, and time. Vital signs are normal. She appears well-developed and well-nourished. She is active and cooperative. She does not appear ill. No distress.  HENT:  Head: Normocephalic and atraumatic.  Mouth/Throat: Uvula is midline, oropharynx is clear and moist and mucous membranes are normal. Mucous membranes are not pale, not dry and not cyanotic.  Eyes: Conjunctivae, EOM and lids are normal. Pupils are equal, round, and reactive to light.  Neck: Trachea normal, normal range of motion and full passive range of motion without pain. Neck supple. No JVD present. No tracheal deviation, no edema and no erythema present. No thyromegaly present.  Cardiovascular: Normal rate, regular rhythm, normal heart sounds, intact distal pulses and normal pulses.  Exam reveals no gallop, no distant heart sounds and no friction rub.   No murmur heard. Pulmonary/Chest: Effort normal and breath sounds normal. No accessory muscle usage. No respiratory distress. She has no decreased breath sounds. She has no wheezes. She has no rhonchi. She has no rales. She exhibits no tenderness.  Abdominal: Normal appearance and bowel sounds are normal. She exhibits no distension and no ascites. There is no tenderness.  Musculoskeletal: She exhibits no edema or tenderness (minimal).       Left hip: She exhibits decreased range of motion, decreased strength and swelling.  Expected osteoarthritis, stiffness  Neurological: She is alert and oriented to person, place, and time. She has normal strength. She displays tremor.  Skin: Skin is warm and dry. Laceration (left hip- incision) noted. No rash noted. She is not diaphoretic. No cyanosis or erythema. No pallor. Nails show no clubbing.  Psychiatric: She has a normal mood and affect. Her speech is normal  and behavior is normal. Judgment and thought content normal. Cognition and memory are normal.  Nursing note and vitals reviewed.   Labs reviewed:  Recent Labs  02/19/16 1600  03/06/16 1120 03/09/16 0637 03/10/16 0427  NA 133*  < > 131* 135 136  K 4.7  < > 4.6 4.1 4.2  CL 97*  < > 99* 102 103  CO2 32  < > 26 29 27   GLUCOSE  121*  < > 109* 109* 100*  BUN 13  < > 13 10 8   CREATININE 0.56  < > 0.68 0.43* 0.42*  CALCIUM 8.7*  < > 8.1* 8.0* 8.3*  MG 2.0  --   --   --   --   < > = values in this interval not displayed.  Recent Labs  03/03/16 0745 03/05/16 1930 03/06/16 1120  AST 17 20 29   ALT 10* 10* 11*  ALKPHOS 156* 133* 135*  BILITOT 0.4 0.9 1.2  PROT 5.9* 5.7* 5.9*  ALBUMIN 2.4* 2.2* 2.2*    Recent Labs  03/06/16 1120  03/10/16 0427 03/11/16 0700 03/16/16 0720  WBC 15.5*  < > 10.7 10.9 11.7*  NEUTROABS 10.9*  --   --  6.6* 6.6*  HGB 7.4*  < > 8.6* 8.7* 9.4*  HCT 21.8*  < > 25.5* 25.6* 28.3*  MCV 85.3  < > 85.7 84.0 85.5  PLT 468*  < > 526* 569* 618*  < > = values in this interval not displayed. Lab Results  Component Value Date   TSH 2.178 02/19/2016   No results found for: HGBA1C No results found for: CHOL, HDL, LDLCALC, LDLDIRECT, TRIG, CHOLHDL  Significant Diagnostic Results in last 30 days:  Dg Chest 1 View  Result Date: 03/09/2016 CLINICAL DATA:  Wheezing. Status post incision and drainage of left hip hematoma. EXAM: CHEST 1 VIEW COMPARISON:  02/10/2016 and 03/09/2016 at 0915 hours FINDINGS: Persistent linear density in the right lung extending from the right hilum. This linear density is probably associated with scarring and volume loss. Slightly increased densities in the right upper lung compared to the exam from 02/10/2016. However, there may be slightly improved aeration since the study earlier today. Coarse lung markings appear to be chronic. Again noted are surgical clips in the left axilla. Heart size is grossly within normal limits and stable.  IMPRESSION: Slightly improved aeration in the right upper lung compared to the study earlier in the day. Findings may represent decreasing atelectasis or asymmetric edema. Chronic lung changes. Electronically Signed   By: Markus Daft M.D.   On: 03/09/2016 15:05   Dg Chest 1 View  Result Date: 03/09/2016 CLINICAL DATA:  Wheezing. EXAM: CHEST 1 VIEW COMPARISON:  02/10/2016.  CT 09/10/2014 FINDINGS: Right upper lobe and bibasilar subsegmental atelectasis and/or infiltrate. Linear density noted the right upper chest consistent with scarring again noted. Surgical clips noted over the right mid chest and left axilla. Cardiomegaly with normal pulmonary vascularity. No acute bony abnormality. IMPRESSION: 1. Right upper lobe and bibasilar subsegmental atelectasis and/or infiltrates. Follow-up chest x-rays recommended to demonstrate clearing. 2. Cardiomegaly with normal pulmonary vascularity. Electronically Signed   By: Marcello Moores  Register   On: 03/09/2016 09:39   US Pelvis Complete  Result Date: 03/06/2016 CLINICAL DATA:  Left-sided ovarian cystic lesion on CT. EXAM: TRANSABDOMINAL ULTRASOUND OF PELVIS TECHNIQUE: Transabdominal ultrasound examination of the pelvis was performed including evaluation of the uterus, ovaries, adnexal regions, and pelvic cul-de-sac. COMPARISON:  CT of earlier today. FINDINGS: Uterus Measurements: 7.5 x 1.4 x 2.7 cm. Normal in morphology for age. Endometrium Thickness: On the order of 6-7 mm. No focal abnormality. Trace fluid is suspected within the endometrium of the uterine fundus on image 26. Right ovary Not visualized. A simple appearing cystic lesion is identified within the right adnexa and measures 4.6 x 3.7 x 3.7 cm. Left ovary Not visualized. No left-sided ovarian or adnexal cystic lesion is seen to correspond to the CT  abnormality. Other findings:  No abnormal free fluid. IMPRESSION: 1. Exam performed transabdominally only at patient preference. 2. The left pelvic cystic lesion is  not identified. This is simple in appearance on noncontrast CT. 3. Simple appearing right adnexal lesion is also identified measuring maximally 4.6 cm. Question peritoneal inclusion cyst or less likely ovarian cyst. Per consensus criteria, this warrants follow-up with pelvic ultrasound at 1 year. On this exam, recommend attention to the left hemipelvis as well. This recommendation follows the consensus statement: Management of Asymptomatic Ovarian and Other Adnexal Cysts Imaged at [place]: Society of Radiologists in [place]. Radiology 2010; [id number]. 4. Suspicion of trace fluid within the endometrium. Correlate with symptoms of postmenopausal bleeding. Presuming none, this is of doubtful significance. Electronically Signed   By: Abigail Miyamoto M.D.   On: 03/06/2016 16:31   Ct Femur Left W Contrast  Result Date: 03/06/2016 CLINICAL DATA:  Status post fixation of a left hip fracture 02/11/2016. Pain, swelling and discoloration about the left upper leg. EXAM: CT OF THE LEFT FEMUR WITH CONTRAST TECHNIQUE: Contiguous axial CT images were taken through the left upper leg after IV contrast administration. Coronal and sagittal reformatted images are provided. CONTRAST:  100 ml ISOVUE-300 IOPAMIDOL (ISOVUE-300) INJECTION 61% COMPARISON:  Plain films left hip 02/10/2016. FINDINGS: The patient is status post fixation of a left intertrochanteric fracture with a hip screw and short intramedullary nail. In the lateral compartment of the left upper leg, there is a predominantly high attenuating collection measuring 31 cm craniocaudal by approximately 4.1 cm transverse by 9.6 cm AP. Scattered fluid-fluid levels are identified within the collection. Immediately adjacent to the greater trochanter, a low attenuating collection measuring 5.6 cm craniocaudal by 2.4 cm transverse by 2.3 cm AP is seen. A collection in the subcutaneous fatty tissues at the level of the superior margin of the greater  trochanter measures 1.7 cm AP by 4.3 cm transverse by 5.2 cm craniocaudal. It is also low attenuating. Heterotopic ossification or callus formation about the patient's fracture is best seen at the lesser trochanter. Hardware is intact. Screw in the hip abuts the cortex at the articular surface of the femoral head but does not definitely penetrated the cortex. The screw has migrated medially since the prior exam. Healing left superior and inferior pubic ramus fractures are identified. Degenerative disease about the hip and symphysis pubis there is noted. Intrapelvic contents demonstrate a cystic lesion in the right adnexa measuring 4.9 cm AP by 3.7 cm transverse by 4.6 cm craniocaudal. IMPRESSION: Findings consistent with a large hematoma extending the length of the lateral compartment of the left upper leg. Fluid-fluid levels are seen within the hematoma. The hematoma be superinfected but no specific finding to suggest infection is identified. Fluid collections off the right greater trochanter and in the subcutaneous fatty tissues just superior to the greater trochanter and do not appear to communicate. Presumably these are postoperative seromas. Status post fixation of a left intertrochanteric fracture. Screw in the femoral head and neck has migrated medially and now abuts the cortex of the articular surface of the femoral head. Healing left inferior and superior pubic rami fractures. **An incidental finding of potential clinical significance has been found. Cystic lesion left adnexa cannot be definitively characterized. Pelvic ultrasound is recommended further evaluation.** Electronically Signed   By: Inge Rise M.D.   On: 03/06/2016 13:39    Assessment/Plan  1. Closed left hip fracture, with routine healing, subsequent encounter 2. Pain, acute post-operative  Continue current  regimen  acetaminophen [OTC] tablet; 325 mg; amt: 2 tablets (650 mg); oral Pain control QID prn  oxycodone - Schedule II  tablet; 5 mg; amt: 1-2 tabs (5-10mg ); oral  Methocarbamol tablet; 500 mg; amt: 1 tablet; oral Q 6 hrs prn  Ice to left hip QID and prn  3. Encounter for therapeutic drug monitoring  Coumadin 5 mg PO Q Day at 1700 for Atrial Fibrillation  Recheck INR 1 week  Monitor for s/s of bleeding, bruising, hematuria, hematemesis, melena  Check INR 3 days after initiation of antibiotic, when applicable.  4. 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  Vikki Ports, NP-C Geriatrics [facility] [phone], [place] [address] Cell Phone (Mon-Fri 8am-5pm):  [phone] On Call:  [phone] & follow prompts after 5pm & weekends Office Phone:  [phone] Office Fax:  [phone]

## 2016-04-02 LAB — PROTIME-INR
INR: 2.95
PROTHROMBIN TIME: 31.4 s — AB (ref 11.4–15.2)

## 2016-04-05 LAB — PROTIME-INR
INR: 3.34
PROTHROMBIN TIME: 34.6 s — AB (ref 11.4–15.2)

## 2016-04-06 ENCOUNTER — Non-Acute Institutional Stay (SKILLED_NURSING_FACILITY): Payer: Medicare Other | Admitting: Gerontology

## 2016-04-06 DIAGNOSIS — G8918 Other acute postprocedural pain: Secondary | ICD-10-CM

## 2016-04-06 DIAGNOSIS — I82503 Chronic embolism and thrombosis of unspecified deep veins of lower extremity, bilateral: Secondary | ICD-10-CM | POA: Diagnosis not present

## 2016-04-06 DIAGNOSIS — S72002D Fracture of unspecified part of neck of left femur, subsequent encounter for closed fracture with routine healing: Secondary | ICD-10-CM | POA: Diagnosis not present

## 2016-04-06 DIAGNOSIS — F419 Anxiety disorder, unspecified: Secondary | ICD-10-CM

## 2016-04-06 DIAGNOSIS — F32A Depression, unspecified: Secondary | ICD-10-CM

## 2016-04-06 DIAGNOSIS — F418 Other specified anxiety disorders: Secondary | ICD-10-CM | POA: Diagnosis not present

## 2016-04-06 DIAGNOSIS — Z5181 Encounter for therapeutic drug level monitoring: Secondary | ICD-10-CM

## 2016-04-06 DIAGNOSIS — F329 Major depressive disorder, single episode, unspecified: Secondary | ICD-10-CM

## 2016-04-09 NOTE — Progress Notes (Signed)
Location:      Place of Service:  SNF (31) Provider:  Toni Arthurs, NP-C  SPARKS,JEFFREY D, MD  Patient Care Team: Idelle Crouch, MD as PCP - General (Internal Medicine)  Extended Emergency Contact Information Primary Emergency Contact: Driver,Laurel M Address: [address]          [place], [place] [address] [place] Phone: [phone] Relation: Daughter Secondary Emergency Contact: Mash,Gary  [place] Phone: [phone] Relation: Son  Code Status:  full Goals of care: Advanced Directive information Advanced Directives 03/06/2016  Does patient have an advance directive? Yes  Type of Advance Directive Living will;Healthcare Power of Attorney  Does patient want to make changes to advanced directive? No - Patient declined  Copy of advanced directive(s) in chart? Yes     Chief Complaint  Patient presents with  . Discharge Note    HPI:  Pt is a 82 y.o. female seen today for discharge evaluation and for INR monitoring as well as follow up on pain management, anxiety/ depression.  She denies consistent pain. Says it only hurts on occasion. When directly asked, she reports her pain as 0 out of 10. She denies spasms and cramps especially in her thigh.  INR yesterday was  3.34, used  for management of Coumadin  for VTE Prophylaxis in the setting of afib/ chronic DVTs. Pt has been complaint with medication regimen and is aware of dietary modifications needed. Pt is aware of importance of continued compliance with medication and testing. Pt has not displayed any adverse effects related to anticoagulant therapy such as unexplained or excessive bleeding, bruising, hematuria, hematemesis, melena. Medication held for 1 day, then reduced to 4.5 mg from 5 mg. Heart rate is controlled. Pain is well controlled. Pt is now requiring minimal pain medications. Pt reports anxiety and depression is improved. Educated pt to continue taking medications for  this. Son was present and reinforced the instructions. Decreased pain and edema to BLE. Vital signs are stable. No other complaints. Pt reports she is feeling well and ready to discharge home.      Past Medical History:  Diagnosis Date  . Arthritis   . Asthma   . Cancer (HCC)    BREAST  . Cough    CHRONIC  . Depression   . Dizziness   . DVT (deep venous thrombosis) ([facility])   . Edema    FEET/LEGS  . GERD (gastroesophageal reflux disease)   . Heart murmur   . Heart palpitations   . Hepatitis   . HOH (hard of hearing)   . Hyperlipidemia   . Hypertension   . Osteoporosis   . Shortness of breath dyspnea   . Tremors of nervous system    Past Surgical History:  Procedure Laterality Date  . APPENDECTOMY    . BREAST SURGERY     LUMPECTOMY  . CATARACT EXTRACTION W/PHACO Right 12/18/2015   Procedure: CATARACT EXTRACTION PHACO AND INTRAOCULAR LENS PLACEMENT (IOC);  Surgeon: Eulogio Bear, MD;  Location: [facility] ORS;  Service: Ophthalmology;  Laterality: Right;  [place] 2.00 AP% 14.5 CDE 17.61 Fluid Pack lot # [id number]  . CATARACT EXTRACTION W/PHACO Left 02/05/2016   Procedure: CATARACT EXTRACTION PHACO AND INTRAOCULAR LENS PLACEMENT (IOC);  Surgeon: Eulogio Bear, MD;  Location: [facility] ORS;  Service: Ophthalmology;  Laterality: Left;  [place] 01:23 AP% 8.1 CDE 6.83 Fluid  pack lot # [id number]  . CHOLECYSTECTOMY    . EXTERNAL EAR SURGERY    .  EYE SURGERY    . HERNIA REPAIR    . INCISION AND DRAINAGE Left 03/07/2016   Procedure: INCISION AND DRAINAGE left hip and application of wound vac;  Surgeon: Hessie Knows, MD;  Location: [facility] ORS;  Service: Orthopedics;  Laterality: Left;  . INTRAMEDULLARY (IM) NAIL INTERTROCHANTERIC Left 02/11/2016   Procedure: INTRAMEDULLARY (IM) NAIL INTERTROCHANTRIC;  Surgeon: Dereck Leep, MD;  Location: [facility] ORS;  Service: Orthopedics;  Laterality: Left;  . IVC FILTER PLACEMENT ([facility] HX)    . KNEE ARTHROSCOPY    . MASTECTOMY Left   . NOSE SURGERY     CANCER    . TONSILLECTOMY    . TUBAL LIGATION      Allergies  Allergen Reactions  . Morphine And Related Nausea And Vomiting  . Penicillins Other (See Comments)    Unknown Has patient had a PCN reaction causing immediate rash, facial/tongue/throat swelling, SOB or lightheadedness with hypotension: unsure Has patient had a PCN reaction causing severe rash involving mucus membranes or skin necrosis: unsure Has patient had a PCN reaction that required hospitalization unsure Has patient had a PCN reaction occurring within the last 10 years: no If all of the above answers are "NO", then may proceed with Cephalosporin use.      Medication List       Accurate as of 04/06/16 11:59 PM. Always use your most recent med list.          acetaminophen 500 MG tablet Commonly known as:  TYLENOL Take 500 mg by mouth every 6 (six) hours as needed for mild pain.   clindamycin 300 MG capsule Commonly known as:  CLEOCIN Take 1 capsule (300 mg total) by mouth 3 (three) times daily.   DUREZOL 0.05 % Emul Generic drug:  Difluprednate Apply 1 drop to eye 2 (two) times daily. LEFT eye   ENSURE Take 237 mLs by mouth.   ferrous sulfate 325 (65 FE) MG tablet Take 1 tablet (325 mg total) by mouth 2 (two) times daily with a meal.   ipratropium-albuterol 0.5-2.5 (3) MG/3ML Soln Commonly known as:  DUONEB Take 3 mLs by nebulization every 6 (six) hours as needed.   LEVOFLOXACIN IV Inject 750 mg into the vein daily. For cellulitis   meclizine 12.5 MG tablet Commonly known as:  ANTIVERT Take 12.5 mg by mouth every 6 (six) hours as needed for dizziness.   methocarbamol 500 MG tablet Commonly known as:  ROBAXIN Take 500 mg by mouth every 6 (six) hours as needed for muscle spasms.   mirtazapine 7.5 MG tablet Commonly known as:  REMERON Take 7.5 mg by mouth at bedtime.   moxifloxacin 0.5 % ophthalmic solution Commonly known as:  VIGAMOX Place 1 drop into the left eye 4 (four) times daily.    oxyCODONE 5 MG immediate release tablet Commonly known as:  Oxy IR/ROXICODONE Take 1-2 tablets (5-10 mg total) by mouth every 4 (four) hours as needed for breakthrough pain ((for MODERATE breakthrough pain)).   propranolol ER 60 MG 24 hr capsule Commonly known as:  INDERAL LA Take 60 mg by mouth daily.   sennosides-docusate sodium 8.6-50 MG tablet Commonly known as:  SENOKOT-S Take 1 tablet by mouth 2 (two) times daily.   venlafaxine XR 75 MG 24 hr capsule Commonly known as:  EFFEXOR-XR Take 75 mg by mouth daily with breakfast.   vitamin B-12 1000 MCG tablet Commonly known as:  CYANOCOBALAMIN Take 1,000 mcg by mouth daily.   Vitamin D3 1000 units Caps Take 4  capsules by mouth daily.   warfarin 3 MG tablet Commonly known as:  COUMADIN Take 3 mg by mouth daily.   warfarin 1 MG tablet Commonly known as:  COUMADIN Take 0.5 mg by mouth daily.       Review of Systems  Constitutional: Negative for activity change, appetite change, chills, diaphoresis and fever.  HENT: Negative for congestion, sneezing, sore throat, trouble swallowing and voice change.   Eyes: Negative for pain, redness and visual disturbance.  Respiratory: Negative for apnea, cough, choking, chest tightness, shortness of breath and wheezing.   Cardiovascular: Negative for chest pain, palpitations and leg swelling.  Gastrointestinal: Negative for abdominal distention, abdominal pain, anal bleeding, blood in stool, constipation, diarrhea and nausea.  Genitourinary: Negative for difficulty urinating, dysuria, frequency and urgency.  Musculoskeletal: Positive for arthralgias (typical arthritis) and joint swelling (minimal). Negative for back pain, gait problem and myalgias.  Skin: Positive for wound (Left hip incision). Negative for color change, pallor and rash.  Neurological: Negative for tremors, syncope, speech difficulty, weakness, numbness and headaches.  Psychiatric/Behavioral: Negative for agitation and  behavioral problems. The patient is not nervous/anxious.   All other systems reviewed and are negative.    There is no immunization history on file for this patient. Pertinent  Health Maintenance Due  Topic Date Due  . PNA vac Low Risk Adult (1 of 2 - PCV13) 01/30/1992  . INFLUENZA VACCINE  01/13/2016  . DEXA SCAN  Completed   No flowsheet data found. Functional Status Survey:    Vitals:   04/06/16 1230  BP: (!) 131/27  Pulse: (!) 56  Resp: 20  Temp: 98.4 F (36.9 C)  SpO2: 91%   There is no height or weight on file to calculate BMI. Physical Exam  Constitutional: She is oriented to person, place, and time. Vital signs are normal. She appears well-developed and well-nourished. She is active and cooperative. She does not appear ill. No distress.  HENT:  Head: Normocephalic and atraumatic.  Mouth/Throat: Uvula is midline, oropharynx is clear and moist and mucous membranes are normal. Mucous membranes are not pale, not dry and not cyanotic.  Eyes: Conjunctivae, EOM and lids are normal. Pupils are equal, round, and reactive to light.  Neck: Trachea normal, normal range of motion and full passive range of motion without pain. Neck supple. No JVD present. No tracheal deviation, no edema and no erythema present. No thyromegaly present.  Cardiovascular: Normal rate, regular rhythm, normal heart sounds, intact distal pulses and normal pulses.  Exam reveals no gallop, no distant heart sounds and no friction rub.   No murmur heard. Pulmonary/Chest: Effort normal and breath sounds normal. No accessory muscle usage. No respiratory distress. She has no decreased breath sounds. She has no wheezes. She has no rhonchi. She has no rales. She exhibits no tenderness.  Abdominal: Normal appearance and bowel sounds are normal. She exhibits no distension and no ascites. There is no tenderness.  Musculoskeletal: She exhibits no edema or tenderness (minimal).       Left hip: She exhibits decreased range  of motion, decreased strength and swelling.  Expected osteoarthritis, stiffness; calves soft, supple. Negative Homan's sign  Neurological: She is alert and oriented to person, place, and time. She has normal strength. She displays tremor.  Skin: Skin is warm and dry. Laceration (left hip- incision) noted. No rash noted. She is not diaphoretic. No cyanosis or erythema. No pallor. Nails show no clubbing.  Psychiatric: She has a normal mood and affect. Her speech  is normal and behavior is normal. Judgment and thought content normal. Cognition and memory are normal.  Nursing note and vitals reviewed.   Labs reviewed:  Recent Labs  02/19/16 1600  03/06/16 1120 03/09/16 0637 03/10/16 0427  NA 133*  < > 131* 135 136  K 4.7  < > 4.6 4.1 4.2  CL 97*  < > 99* 102 103  CO2 32  < > 26 29 27   GLUCOSE 121*  < > 109* 109* 100*  BUN 13  < > 13 10 8   CREATININE 0.56  < > 0.68 0.43* 0.42*  CALCIUM 8.7*  < > 8.1* 8.0* 8.3*  MG 2.0  --   --   --   --   < > = values in this interval not displayed.  Recent Labs  03/03/16 0745 03/05/16 1930 03/06/16 1120  AST 17 20 29   ALT 10* 10* 11*  ALKPHOS 156* 133* 135*  BILITOT 0.4 0.9 1.2  PROT 5.9* 5.7* 5.9*  ALBUMIN 2.4* 2.2* 2.2*    Recent Labs  03/06/16 1120  03/10/16 0427 03/11/16 0700 03/16/16 0720  WBC 15.5*  < > 10.7 10.9 11.7*  NEUTROABS 10.9*  --   --  6.6* 6.6*  HGB 7.4*  < > 8.6* 8.7* 9.4*  HCT 21.8*  < > 25.5* 25.6* 28.3*  MCV 85.3  < > 85.7 84.0 85.5  PLT 468*  < > 526* 569* 618*  < > = values in this interval not displayed. Lab Results  Component Value Date   TSH 2.178 02/19/2016   No results found for: HGBA1C No results found for: CHOL, HDL, LDLCALC, LDLDIRECT, TRIG, CHOLHDL  Significant Diagnostic Results in last 30 days:  No results found.  Assessment/Plan  1. Closed left hip fracture, with routine healing, subsequent encounter 2. Pain, acute post-operative  Continue current regimen  acetaminophen [OTC] tablet;  325 mg; amt: 2 tablets (650 mg); oral Pain control QID prn  oxycodone - Schedule II tablet; 5 mg; amt: 1-2 tabs (5-10mg ); oral  Methocarbamol tablet; 500 mg; amt: 1 tablet; oral Q 6 hrs prn  Ice to left hip QID and prn  3. Encounter for therapeutic drug monitoring  Coumadin 4.5 mg PO Q Day at 1700 for Atrial Fibrillation  Recheck INR 1 week  Monitor for s/s of bleeding, bruising, hematuria, hematemesis, melena  Check INR 3 days after initiation of antibiotic, when applicable.  4. 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 Anxiety and Depression  Continue Venlafaxin XR 75 mg po Q day  Patient is being discharged with the following home health services:  Home health pt/ot/sn  Patient is being discharged with the following durable medical equipment:  wheelchair  Patient has been advised to f/u with their PCP in 1-2 weeks to bring them up to date on their rehab stay.  Social services at facility was responsible for arranging this appointment.  Pt was provided with a 30 day supply of prescriptions for medications and refills must be obtained from their PCP.  For controlled substances, a more limited supply may be provided adequate until PCP appointment only. Family/ staff Communication:   Total Time:  Documentation:  Face to Face:  Family/Phone:   Labs/tests ordered:  Recheck INR 1 week  Medication list reviewed and assessed for continued appropriateness.  Vikki Ports, NP-C Geriatrics  [facility] [address], [place] [address] Cell Phone (Mon-Fri 8am-5pm):  [phone] On Call:  [phone] & follow prompts after 5pm & weekends Office Phone:  [phone] Office Fax:  [phone]

## 2016-06-14 ENCOUNTER — Emergency Department
Admission: EM | Admit: 2016-06-14 | Discharge: 2016-06-14 | Payer: Medicare Other | Attending: Emergency Medicine | Admitting: Emergency Medicine

## 2016-06-14 ENCOUNTER — Emergency Department: Payer: Medicare Other

## 2016-06-14 ENCOUNTER — Encounter: Payer: Self-pay | Admitting: Emergency Medicine

## 2016-06-14 DIAGNOSIS — R739 Hyperglycemia, unspecified: Secondary | ICD-10-CM | POA: Diagnosis present

## 2016-06-14 DIAGNOSIS — R042 Hemoptysis: Secondary | ICD-10-CM | POA: Diagnosis present

## 2016-06-14 DIAGNOSIS — G9341 Metabolic encephalopathy: Secondary | ICD-10-CM | POA: Diagnosis present

## 2016-06-14 DIAGNOSIS — Y95 Nosocomial condition: Secondary | ICD-10-CM | POA: Diagnosis present

## 2016-06-14 DIAGNOSIS — E785 Hyperlipidemia, unspecified: Secondary | ICD-10-CM | POA: Diagnosis present

## 2016-06-14 DIAGNOSIS — Z9012 Acquired absence of left breast and nipple: Secondary | ICD-10-CM

## 2016-06-14 DIAGNOSIS — R251 Tremor, unspecified: Secondary | ICD-10-CM | POA: Diagnosis present

## 2016-06-14 DIAGNOSIS — Z6825 Body mass index (BMI) 25.0-25.9, adult: Secondary | ICD-10-CM

## 2016-06-14 DIAGNOSIS — J9622 Acute and chronic respiratory failure with hypercapnia: Secondary | ICD-10-CM

## 2016-06-14 DIAGNOSIS — Z7901 Long term (current) use of anticoagulants: Secondary | ICD-10-CM | POA: Insufficient documentation

## 2016-06-14 DIAGNOSIS — I480 Paroxysmal atrial fibrillation: Secondary | ICD-10-CM | POA: Diagnosis present

## 2016-06-14 DIAGNOSIS — Z7189 Other specified counseling: Secondary | ICD-10-CM

## 2016-06-14 DIAGNOSIS — Z853 Personal history of malignant neoplasm of breast: Secondary | ICD-10-CM | POA: Diagnosis not present

## 2016-06-14 DIAGNOSIS — M810 Age-related osteoporosis without current pathological fracture: Secondary | ICD-10-CM | POA: Diagnosis present

## 2016-06-14 DIAGNOSIS — M1990 Unspecified osteoarthritis, unspecified site: Secondary | ICD-10-CM | POA: Diagnosis present

## 2016-06-14 DIAGNOSIS — K219 Gastro-esophageal reflux disease without esophagitis: Secondary | ICD-10-CM | POA: Diagnosis present

## 2016-06-14 DIAGNOSIS — J9601 Acute respiratory failure with hypoxia: Secondary | ICD-10-CM | POA: Diagnosis present

## 2016-06-14 DIAGNOSIS — Z823 Family history of stroke: Secondary | ICD-10-CM

## 2016-06-14 DIAGNOSIS — Z515 Encounter for palliative care: Secondary | ICD-10-CM

## 2016-06-14 DIAGNOSIS — Z885 Allergy status to narcotic agent status: Secondary | ICD-10-CM | POA: Diagnosis not present

## 2016-06-14 DIAGNOSIS — I2109 ST elevation (STEMI) myocardial infarction involving other coronary artery of anterior wall: Principal | ICD-10-CM | POA: Diagnosis present

## 2016-06-14 DIAGNOSIS — I214 Non-ST elevation (NSTEMI) myocardial infarction: Secondary | ICD-10-CM | POA: Diagnosis not present

## 2016-06-14 DIAGNOSIS — Z66 Do not resuscitate: Secondary | ICD-10-CM | POA: Diagnosis present

## 2016-06-14 DIAGNOSIS — Z4659 Encounter for fitting and adjustment of other gastrointestinal appliance and device: Secondary | ICD-10-CM

## 2016-06-14 DIAGNOSIS — J189 Pneumonia, unspecified organism: Secondary | ICD-10-CM

## 2016-06-14 DIAGNOSIS — Z79899 Other long term (current) drug therapy: Secondary | ICD-10-CM | POA: Insufficient documentation

## 2016-06-14 DIAGNOSIS — F419 Anxiety disorder, unspecified: Secondary | ICD-10-CM | POA: Diagnosis present

## 2016-06-14 DIAGNOSIS — J9600 Acute respiratory failure, unspecified whether with hypoxia or hypercapnia: Secondary | ICD-10-CM | POA: Diagnosis not present

## 2016-06-14 DIAGNOSIS — J9621 Acute and chronic respiratory failure with hypoxia: Secondary | ICD-10-CM

## 2016-06-14 DIAGNOSIS — Z961 Presence of intraocular lens: Secondary | ICD-10-CM | POA: Diagnosis present

## 2016-06-14 DIAGNOSIS — H9190 Unspecified hearing loss, unspecified ear: Secondary | ICD-10-CM | POA: Diagnosis present

## 2016-06-14 DIAGNOSIS — I110 Hypertensive heart disease with heart failure: Secondary | ICD-10-CM | POA: Diagnosis present

## 2016-06-14 DIAGNOSIS — I249 Acute ischemic heart disease, unspecified: Secondary | ICD-10-CM | POA: Diagnosis not present

## 2016-06-14 DIAGNOSIS — I213 ST elevation (STEMI) myocardial infarction of unspecified site: Secondary | ICD-10-CM | POA: Diagnosis not present

## 2016-06-14 DIAGNOSIS — Z880 Allergy status to penicillin: Secondary | ICD-10-CM | POA: Diagnosis not present

## 2016-06-14 DIAGNOSIS — F329 Major depressive disorder, single episode, unspecified: Secondary | ICD-10-CM | POA: Diagnosis present

## 2016-06-14 DIAGNOSIS — Z9842 Cataract extraction status, left eye: Secondary | ICD-10-CM

## 2016-06-14 DIAGNOSIS — I5033 Acute on chronic diastolic (congestive) heart failure: Secondary | ICD-10-CM | POA: Diagnosis present

## 2016-06-14 DIAGNOSIS — I10 Essential (primary) hypertension: Secondary | ICD-10-CM | POA: Insufficient documentation

## 2016-06-14 DIAGNOSIS — Z9841 Cataract extraction status, right eye: Secondary | ICD-10-CM

## 2016-06-14 DIAGNOSIS — Z86718 Personal history of other venous thrombosis and embolism: Secondary | ICD-10-CM

## 2016-06-14 DIAGNOSIS — J45909 Unspecified asthma, uncomplicated: Secondary | ICD-10-CM | POA: Diagnosis not present

## 2016-06-14 DIAGNOSIS — R54 Age-related physical debility: Secondary | ICD-10-CM | POA: Diagnosis present

## 2016-06-14 DIAGNOSIS — R079 Chest pain, unspecified: Secondary | ICD-10-CM | POA: Diagnosis present

## 2016-06-14 DIAGNOSIS — Z8249 Family history of ischemic heart disease and other diseases of the circulatory system: Secondary | ICD-10-CM

## 2016-06-14 DIAGNOSIS — R0602 Shortness of breath: Secondary | ICD-10-CM | POA: Diagnosis present

## 2016-06-14 LAB — COMPREHENSIVE METABOLIC PANEL
ALT: 13 U/L — ABNORMAL LOW (ref 14–54)
ANION GAP: 7 (ref 5–15)
AST: 27 U/L (ref 15–41)
Albumin: 3.1 g/dL — ABNORMAL LOW (ref 3.5–5.0)
Alkaline Phosphatase: 95 U/L (ref 38–126)
BILIRUBIN TOTAL: 0.8 mg/dL (ref 0.3–1.2)
BUN: 17 mg/dL (ref 6–20)
CO2: 29 mmol/L (ref 22–32)
Calcium: 8.9 mg/dL (ref 8.9–10.3)
Chloride: 96 mmol/L — ABNORMAL LOW (ref 101–111)
Creatinine, Ser: 0.62 mg/dL (ref 0.44–1.00)
GFR calc Af Amer: >60 mL/min (ref >60)
Glucose, Bld: 191 mg/dL — ABNORMAL HIGH (ref 65–99)
POTASSIUM: 4.3 mmol/L (ref 3.5–5.1)
Sodium: 132 mmol/L — ABNORMAL LOW (ref 135–145)
TOTAL PROTEIN: 7.9 g/dL (ref 6.5–8.1)

## 2016-06-14 LAB — PROTIME-INR
INR: 2.73
Prothrombin Time: 29.5 seconds — ABNORMAL HIGH (ref 11.4–15.2)

## 2016-06-14 LAB — LIPID PANEL
Cholesterol: 202 mg/dL — ABNORMAL HIGH (ref 0–200)
HDL: 72 mg/dL (ref >40)
LDL Cholesterol: 102 mg/dL — ABNORMAL HIGH (ref 0–99)
TRIGLYCERIDES: 138 mg/dL (ref <150)
Total CHOL/HDL Ratio: 2.8 RATIO
VLDL: 28 mg/dL (ref 0–40)

## 2016-06-14 LAB — APTT: aPTT: 55 seconds — ABNORMAL HIGH (ref 24–36)

## 2016-06-14 LAB — CBC
HCT: 39.3 % (ref 35.0–47.0)
Hemoglobin: 13.1 g/dL (ref 12.0–16.0)
MCH: 27.7 pg (ref 26.0–34.0)
MCHC: 33.4 g/dL (ref 32.0–36.0)
MCV: 82.8 fL (ref 80.0–100.0)
PLATELETS: 480 10*3/uL — AB (ref 150–440)
RBC: 4.75 MIL/uL (ref 3.80–5.20)
RDW: 15.1 % — ABNORMAL HIGH (ref 11.5–14.5)
WBC: 14.2 10*3/uL — ABNORMAL HIGH (ref 3.6–11.0)

## 2016-06-14 LAB — DIFFERENTIAL
Basophils Absolute: 0.1 10*3/uL (ref 0–0.1)
Basophils Relative: 1 %
EOS ABS: 0.2 10*3/uL (ref 0–0.7)
EOS PCT: 2 %
Lymphocytes Relative: 17 %
Lymphs Abs: 2.4 10*3/uL (ref 1.0–3.6)
Monocytes Absolute: 1.5 10*3/uL — ABNORMAL HIGH (ref 0.2–0.9)
Monocytes Relative: 11 %
NEUTROS PCT: 69 %
Neutro Abs: 10 10*3/uL — ABNORMAL HIGH (ref 1.4–6.5)

## 2016-06-14 LAB — TROPONIN I: TROPONIN I: 0.11 ng/mL — AB (ref <0.03)

## 2016-06-14 MED ORDER — NITROGLYCERIN IN D5W 200-5 MCG/ML-% IV SOLN
0.0000 ug/min | Freq: Once | INTRAVENOUS | Status: AC
  Administered 2016-06-14: 5 ug/min via INTRAVENOUS
  Filled 2016-06-14: qty 250

## 2016-06-14 MED ORDER — MIDAZOLAM HCL 2 MG/2ML IJ SOLN
2.0000 mg | Freq: Once | INTRAMUSCULAR | Status: AC
  Administered 2016-06-14: 2 mg via INTRAVENOUS

## 2016-06-14 MED ORDER — PROPOFOL 1000 MG/100ML IV EMUL
5.0000 ug/kg/min | Freq: Once | INTRAVENOUS | Status: AC
  Administered 2016-06-14: 5 ug/kg/min via INTRAVENOUS

## 2016-06-14 MED ORDER — ORAL CARE MOUTH RINSE: 15.0000 mL | Freq: Four times a day (QID) | OROMUCOSAL | Status: DC

## 2016-06-14 MED ORDER — HEPARIN SODIUM (PORCINE) 5000 UNIT/ML IJ SOLN: 60.0000 [IU]/kg | Freq: Once | INTRAMUSCULAR | Status: DC

## 2016-06-14 MED ORDER — HEPARIN BOLUS VIA INFUSION
4000.0000 [IU] | Freq: Once | INTRAVENOUS | Status: AC
Start: 2016-06-14 — End: 2016-06-14
  Administered 2016-06-14: 4000 [IU] via INTRAVENOUS
  Filled 2016-06-14: qty 4000

## 2016-06-14 MED ORDER — HEPARIN (PORCINE) IN NACL 100-0.45 UNIT/ML-% IJ SOLN
850.0000 [IU]/h | INTRAMUSCULAR | Status: DC
Start: 2016-06-14 — End: 2016-06-14
  Administered 2016-06-14: 850 [IU]/h via INTRAVENOUS
  Filled 2016-06-14: qty 250

## 2016-06-14 MED ORDER — FUROSEMIDE 10 MG/ML IJ SOLN
40.0000 mg | Freq: Once | INTRAMUSCULAR | Status: AC
  Administered 2016-06-14: 40 mg via INTRAVENOUS
  Filled 2016-06-14: qty 4

## 2016-06-14 MED ORDER — CHLORHEXIDINE GLUCONATE 0.12% ORAL RINSE (MEDLINE KIT)
15.0000 mL | Freq: Two times a day (BID) | OROMUCOSAL | Status: DC
  Administered 2016-06-14: 15 mL via OROMUCOSAL

## 2016-06-14 MED ORDER — ASPIRIN 81 MG PO CHEW: 324.0000 mg | CHEWABLE_TABLET | Freq: Once | ORAL | Status: DC

## 2016-06-14 NOTE — ED Triage Notes (Addendum)
Pt to ED via EMS from home alone, med alert called. Per family pt was last normal x2hrs ago. Code stemi called per EMS. EMS BP 209/111, HR 80s, non rebreather initally 86%. Pt on cpap sats 95%, pt working to breathe.Per EMS pt was spitting up blood. RT and MD at bedside.  + JVD

## 2016-06-14 NOTE — ED Notes (Signed)
20 mg etomidate now by raquel RN

## 2016-06-14 NOTE — ED Notes (Signed)
[facility] at bedside 

## 2016-06-14 NOTE — ED Notes (Signed)
324 Asprin given suppository at this time

## 2016-06-14 NOTE — ED Notes (Signed)
Rate/dose change of propofol drip to 1mcg/kg at this time by Sherlene Shams

## 2016-06-14 NOTE — ED Notes (Signed)
RT and MD at bedside to intubate at this time

## 2016-06-14 NOTE — ED Provider Notes (Signed)
[facility] Emergency Department Provider Note   ____________________________________________   I have reviewed the triage vital signs and the nursing notes.   HISTORY  Chief Complaint Code STEMI   History limited by: Acute respiratory failure   HPI [name] is a 83 y.o. female who presents to the emergency department today via EMS because of concern for shortness of breath, and EMS concern for respiratory failure and STEMI. The patient herself is unable to give a good history given acute respiratory distress. Apparently family however was with the patient roughly 3 hours prior to calling 911 and she was in her normal state of health. When EMS got there initial concerns for respiratory. Apparently the patient was coughing up blood and had multiple bloody tissues around the area. The patient then had a 12-lead performed which was concerning for an ST elevation MI. The patient was brought to our facility rather than straight to a facility with cardiac cath capability given concern for impending respiratory failure.   Past Medical History:  Diagnosis Date  . Arthritis   . Asthma   . Cancer (HCC)    BREAST  . Cough    CHRONIC  . Depression   . Dizziness   . DVT (deep venous thrombosis) ([place])   . Edema    FEET/LEGS  . GERD (gastroesophageal reflux disease)   . Heart murmur   . Heart palpitations   . Hepatitis   . HOH (hard of hearing)   . Hyperlipidemia   . Hypertension   . Osteoporosis   . Shortness of breath dyspnea   . Tremors of nervous system     Patient Active Problem List   Diagnosis Date Noted  . Abscess of left thigh 03/06/2016  . Anxiety and depression 02/22/2016  . Essential tremor 02/22/2016  . Chronic embolism and thrombosis of unspecified deep veins of lower extremity, bilateral ([place]) 02/22/2016  . Benign paroxysmal positional vertigo 02/22/2016  . Dizziness 02/22/2016  . Closed left hip fracture ([place]) 02/10/2016     Past Surgical History:  Procedure Laterality Date  . APPENDECTOMY    . BREAST SURGERY     LUMPECTOMY  . CATARACT EXTRACTION W/PHACO Right 12/18/2015   Procedure: CATARACT EXTRACTION PHACO AND INTRAOCULAR LENS PLACEMENT (IOC);  Surgeon: Eulogio Bear, MD;  Location: [facility] ORS;  Service: Ophthalmology;  Laterality: Right;  [place] 2.00 AP% 14.5 CDE 17.61 Fluid Pack lot # [id number]  . CATARACT EXTRACTION W/PHACO Left 02/05/2016   Procedure: CATARACT EXTRACTION PHACO AND INTRAOCULAR LENS PLACEMENT (IOC);  Surgeon: Eulogio Bear, MD;  Location: [facility] ORS;  Service: Ophthalmology;  Laterality: Left;  [place] 01:23 AP% 8.1 CDE 6.83 Fluid  pack lot # [id number]  . CHOLECYSTECTOMY    . EXTERNAL EAR SURGERY    . EYE SURGERY    . HERNIA REPAIR    . INCISION AND DRAINAGE Left 03/07/2016   Procedure: INCISION AND DRAINAGE left hip and application of wound vac;  Surgeon: Hessie Knows, MD;  Location: [facility] ORS;  Service: Orthopedics;  Laterality: Left;  . INTRAMEDULLARY (IM) NAIL INTERTROCHANTERIC Left 02/11/2016   Procedure: INTRAMEDULLARY (IM) NAIL INTERTROCHANTRIC;  Surgeon: Dereck Leep, MD;  Location: [facility] ORS;  Service: Orthopedics;  Laterality: Left;  . IVC FILTER PLACEMENT ([facility] HX)    . KNEE ARTHROSCOPY    . MASTECTOMY Left   . NOSE SURGERY     CANCER  . TONSILLECTOMY    . TUBAL LIGATION      Prior to Admission  medications   Medication Sig Start Date End Date Taking? Authorizing Provider  acetaminophen (TYLENOL) 500 MG tablet Take 500 mg by mouth every 6 (six) hours as needed for mild pain.    Historical Provider, MD  Cholecalciferol (VITAMIN D3) 1000 units CAPS Take 4 capsules by mouth daily.     Historical Provider, MD  clindamycin (CLEOCIN) 300 MG capsule Take 1 capsule (300 mg total) by mouth 3 (three) times daily. 03/10/16   Lattie Corns, PA-C  Difluprednate (DUREZOL) 0.05 % EMUL Apply 1 drop to eye 2 (two) times daily. LEFT eye     Historical Provider, MD  ENSURE (ENSURE) Take  237 mLs by mouth.    Historical Provider, MD  ferrous sulfate 325 (65 FE) MG tablet Take 1 tablet (325 mg total) by mouth 2 (two) times daily with a meal. 02/13/16   Dustin Flock, MD  ipratropium-albuterol (DUONEB) 0.5-2.5 (3) MG/3ML SOLN Take 3 mLs by nebulization every 6 (six) hours as needed. 03/10/16   Lattie Corns, PA-C  LEVOFLOXACIN IV Inject 750 mg into the vein daily. For cellulitis    Historical Provider, MD  meclizine (ANTIVERT) 12.5 MG tablet Take 12.5 mg by mouth every 6 (six) hours as needed for dizziness.    Historical Provider, MD  methocarbamol (ROBAXIN) 500 MG tablet Take 500 mg by mouth every 6 (six) hours as needed for muscle spasms.    Historical Provider, MD  mirtazapine (REMERON) 7.5 MG tablet Take 7.5 mg by mouth at bedtime.    Historical Provider, MD  moxifloxacin (VIGAMOX) 0.5 % ophthalmic solution Place 1 drop into the left eye 4 (four) times daily.    Historical Provider, MD  oxyCODONE (OXY IR/ROXICODONE) 5 MG immediate release tablet Take 1-2 tablets (5-10 mg total) by mouth every 4 (four) hours as needed for breakthrough pain ((for MODERATE breakthrough pain)). 03/10/16   Lattie Corns, PA-C  propranolol ER (INDERAL LA) 60 MG 24 hr capsule Take 60 mg by mouth daily.    Historical Provider, MD  sennosides-docusate sodium (SENOKOT-S) 8.6-50 MG tablet Take 1 tablet by mouth 2 (two) times daily.    Historical Provider, MD  venlafaxine XR (EFFEXOR-XR) 75 MG 24 hr capsule Take 75 mg by mouth daily with breakfast.    Historical Provider, MD  vitamin B-12 (CYANOCOBALAMIN) 1000 MCG tablet Take 1,000 mcg by mouth daily.    Historical Provider, MD  warfarin (COUMADIN) 1 MG tablet Take 0.5 mg by mouth daily.  10/06/15   Historical Provider, MD  warfarin (COUMADIN) 3 MG tablet Take 3 mg by mouth daily.    Historical Provider, MD    Allergies Morphine and related and Penicillins  Family History  Problem Relation Age of Onset  . CAD Mother   . Stroke Father     Social  History Social History  Substance Use Topics  . Smoking status: Never Smoker  . Smokeless tobacco: Never Used  . Alcohol use Yes     Comment: OCC    Review of Systems Unable to obtain secondary to respiratory distress.  ____________________________________________   PHYSICAL EXAM:  VITAL SIGNS: ED Triage Vitals [06/19/2016 2047]  Enc Vitals Group     BP (!) 204/93     Pulse Rate 99     Resp (!) 38     Temp 97.7 F (36.5 C)     Temp Source Axillary     SpO2 96 %     Weight    Constitutional: Awake, alert, severe respiratory distress.  Eyes: Conjunctivae are normal. Normal extraocular movements. ENT   Head: Normocephalic and atraumatic.   Nose: No congestion/rhinnorhea.   Mouth/Throat: Mucous membranes are moist. Blood within the oropharynx.    Neck: No stridor. Hematological/Lymphatic/Immunilogical: No cervical lymphadenopathy. Cardiovascular: Normal rate, regular rhythm.  No murmurs, rubs, or gallops.  Respiratory: Tachypneic. Diminished and rhonchi diffusely.  Gastrointestinal: Soft and non tender. No rebound. No guarding.  Genitourinary: Deferred Musculoskeletal: Normal range of motion in all extremities. No lower extremity edema. Neurologic:  No gross focal neurologic deficits are appreciated.  Skin:  Skin is warm, dry and intact. No rash noted.  ____________________________________________    LABS (pertinent positives/negatives)  Labs Reviewed  CBC - Abnormal; Notable for the following:       Result Value   WBC 14.2 (*)    RDW 15.1 (*)    Platelets 480 (*)    All other components within normal limits  DIFFERENTIAL - Abnormal; Notable for the following:    Neutro Abs 10.0 (*)    Monocytes Absolute 1.5 (*)    All other components within normal limits  PROTIME-INR - Abnormal; Notable for the following:    Prothrombin Time 29.5 (*)    All other components within normal limits  APTT - Abnormal; Notable for the following:    aPTT 55 (*)    All  other components within normal limits  COMPREHENSIVE METABOLIC PANEL - Abnormal; Notable for the following:    Sodium 132 (*)    Chloride 96 (*)    Glucose, Bld 191 (*)    Albumin 3.1 (*)    ALT 13 (*)    All other components within normal limits  TROPONIN I - Abnormal; Notable for the following:    Troponin I 0.11 (*)    All other components within normal limits  LIPID PANEL - Abnormal; Notable for the following:    Cholesterol 202 (*)    LDL Cholesterol 102 (*)    All other components within normal limits  BLOOD GAS, ARTERIAL - Abnormal; Notable for the following:    pH, Arterial 7.30 (*)    pCO2 arterial 55 (*)    pO2, Arterial 76 (*)    All other components within normal limits     ____________________________________________   EKG  I, Nance Pear, attending physician, personally viewed and interpreted this EKG  EKG Time: 2048 Rate: 84 Rhythm: normal sinus rhythm Axis: normal Intervals: qtc 467 QRS: LVH ST changes:  st elevation V1, V2, depression II, III Impression: abnormal ekg   ____________________________________________    RADIOLOGY  CXR IMPRESSION:  Patchy opacities of the left upper lobe, right upper lobe and right  lower lobe suspicious for pneumonia.    Endotracheal tube is identified with distal tip probably 4 cm from  carina but due to the rotation of the film, carinal location is  difficult to assess.    A nasogastric tube is identified with distal tip either coiled in  the proximal stomach or in the esophagus, the exact location cannot  be ascertained as a portion of the nasogastric tube is not included  on the inferior portion of the film and there is overlying artifacts  present in the same vicinity.     Abd Xray IMPRESSION:  Nasogastric tube is identified extending straight from the mid chest  to the mid abdomen. This is unusual course for nasogastric tube.  Consider further evaluation with CT of abdomen to assess placement   prior to nasogastric tube use.  ____________________________________________   PROCEDURES  Procedures  CRITICAL CARE Performed by: Nance Pear   Total critical care time: 35 minutes  Critical care time was exclusive of separately billable procedures and treating other patients.  Critical care was necessary to treat or prevent imminent or life-threatening deterioration.  Critical care was time spent personally by me on the following activities: development of treatment plan with patient and/or surrogate as well as nursing, discussions with consultants, evaluation of patient's response to treatment, examination of patient, obtaining history from patient or surrogate, ordering and performing treatments and interventions, ordering and review of laboratory studies, ordering and review of radiographic studies, pulse oximetry and re-evaluation of patient's condition.   INTUBATION Performed by: Nance Pear  Required items: required blood products, implants, devices, and special equipment available Patient identity confirmed: provided demographic data and hospital-assigned identification number Time out: Immediately prior to procedure a "time out" was called to verify the correct patient, procedure, equipment, support staff and site/side marked as required.  Indications: respiratory failure  Intubation method: Glidescope  Preoxygenation: Non rebreather  Sedatives: Etomidate Paralytic: Succinylcholine  Tube Size: 7.5 cuffed  Post-procedure assessment: chest rise and ETCO2 monitor Breath sounds: equal and absent over the epigastrium Tube secured with: ETT holder Chest x-ray interpreted by radiologist and me.  Chest x-ray findings: endotracheal tube in appropriate position  Patient tolerated the procedure well with no immediate complications.  ____________________________________________   INITIAL IMPRESSION / ASSESSMENT AND PLAN / ED COURSE  Pertinent  labs & imaging results that were available during my care of the patient were reviewed by me and considered in my medical decision making (see chart for details).  Patient presented to the emergency department today via EMS because of concerns for respiratory failure in the setting of an ST elevation MI. On exam patient is severe respiratory distress. There is blood in the oropharynx. Diffuse rhonchi. Given the patient's oxygen saturation was very between the mid 90s to high 80s on nonrebreather he given the fact that the patient was having hemoptysis the decision was made to intubate. I was briefly able to discuss this with the patient who did nod in agreement. The patient was intubated using a kaleidoscope. Intubation without any acute complications. Patient had symmetrical chest rise, good breath sounds and color change. Terms of the ST elevation MI patient was given aspirin. I declined to give further blood thinners given the fact that the patient is on warfarin and was having hemoptysis. I discussed with Dr. Tamala Julian with cardiology at [facility]. He was willing to accept the patient in transfer.  ____________________________________________   FINAL CLINICAL IMPRESSION(S) / ED DIAGNOSES  Final diagnoses:  ST elevation myocardial infarction (STEMI), unspecified artery ([place])  Acute respiratory failure with hypoxia ([place])  Hemoptysis     Note: This dictation was prepared with Dragon dictation. Any transcriptional errors that result from this process are unintentional     Nance Pear, MD 07/10/2016 2312

## 2016-06-14 NOTE — ED Notes (Signed)
120 succ given at this time by raquel RN

## 2016-06-14 NOTE — H&P (Addendum)
CARDIOLOGY H&P  Reason for admission: STEMI Primary cardiologist: none  HPI: [name] is a 83 y.o. female with hx of DVT and pAF on warfarin, anxiety, recent hip fracture who presented to [facility] ED with respiratory failure in setting of STEMI.   Per family, pt was with them during the day and in [place] and then taken back to her assisted living facility around 1700. About 2 hours later the patient pressed her life alert button she wears. EMS arrived and reportedly found her in respiratory distress with a signficant amount of blood tissues around her, apparently from hemoptysis. She was brought to the ED where noted to be in extremis and intubated for respiratory failure. ECG showed anterior STEMI. Case discussed between [facility] ED physician and on-call [facility] cath attending and consensus to defer emergent cath at this time for multiple reasons including unclear and unexplained hemoptysis, elevated INR on warfarin, and advanced age with unclear goals of care. She was transferred to [facility] for further management.   Per family, she has no prior cardiac history except for pAF. No history of angiogram, MI or heart surgery. Regarding her functional status, she has a modest cognitive and functional decline over last 6 months. She fell and broke him and has had 2 associated surgeries for this in recent months (for hip and subsequent hematoma evacuation). She walks with a walker. She lives alone in an assisted living complex with a caretaker that comes 3 hours/day. Cognitively, she is typically alert and oriented but has recently lost interest in paying bills and daughter notes that she has started doing this for her.  Daughter (healthcare POA) has brought paperwork documenting the patients wishes to be DNR.   Review of Systems:  Unable to be obtained as patient intubated and sedated  Past Medical History:  Diagnosis Date  . Arthritis   . Asthma   . Cancer (HCC)    BREAST    . Cough    CHRONIC  . Depression   . Dizziness   . DVT (deep venous thrombosis) ([place])   . Edema    FEET/LEGS  . GERD (gastroesophageal reflux disease)   . Heart murmur   . Heart palpitations   . Hepatitis   . HOH (hard of hearing)   . Hyperlipidemia   . Hypertension   . Osteoporosis   . Shortness of breath dyspnea   . Tremors of nervous system     No current facility-administered medications on file prior to encounter.    Current Outpatient Prescriptions on File Prior to Encounter  Medication Sig Dispense Refill  . acetaminophen (TYLENOL) 500 MG tablet Take 500 mg by mouth every 6 (six) hours as needed for mild pain.    . Cholecalciferol (VITAMIN D3) 1000 units CAPS Take 4 capsules by mouth daily.     . clindamycin (CLEOCIN) 300 MG capsule Take 1 capsule (300 mg total) by mouth 3 (three) times daily. 12 capsule 0  . Difluprednate (DUREZOL) 0.05 % EMUL Apply 1 drop to eye 2 (two) times daily. LEFT eye     . ENSURE (ENSURE) Take 237 mLs by mouth.    . ferrous sulfate 325 (65 FE) MG tablet Take 1 tablet (325 mg total) by mouth 2 (two) times daily with a meal.  3  . ipratropium-albuterol (DUONEB) 0.5-2.5 (3) MG/3ML SOLN Take 3 mLs by nebulization every 6 (six) hours as needed. 120 mL 0  . LEVOFLOXACIN IV Inject 750 mg into the vein daily.  For cellulitis    . meclizine (ANTIVERT) 12.5 MG tablet Take 12.5 mg by mouth every 6 (six) hours as needed for dizziness.    . methocarbamol (ROBAXIN) 500 MG tablet Take 500 mg by mouth every 6 (six) hours as needed for muscle spasms.    . mirtazapine (REMERON) 7.5 MG tablet Take 7.5 mg by mouth at bedtime.    . moxifloxacin (VIGAMOX) 0.5 % ophthalmic solution Place 1 drop into the left eye 4 (four) times daily.    Marland Kitchen oxyCODONE (OXY IR/ROXICODONE) 5 MG immediate release tablet Take 1-2 tablets (5-10 mg total) by mouth every 4 (four) hours as needed for breakthrough pain ((for MODERATE breakthrough pain)). 60 tablet 0  . propranolol ER (INDERAL LA)  60 MG 24 hr capsule Take 60 mg by mouth daily.    . sennosides-docusate sodium (SENOKOT-S) 8.6-50 MG tablet Take 1 tablet by mouth 2 (two) times daily.    Marland Kitchen venlafaxine XR (EFFEXOR-XR) 75 MG 24 hr capsule Take 75 mg by mouth daily with breakfast.    . vitamin B-12 (CYANOCOBALAMIN) 1000 MCG tablet Take 1,000 mcg by mouth daily.    Marland Kitchen warfarin (COUMADIN) 1 MG tablet Take 0.5 mg by mouth daily.   0  . warfarin (COUMADIN) 3 MG tablet Take 3 mg by mouth daily.       Allergies  Allergen Reactions  . Morphine And Related Nausea And Vomiting  . Penicillins Other (See Comments)    Unknown Has patient had a PCN reaction causing immediate rash, facial/tongue/throat swelling, SOB or lightheadedness with hypotension: unsure Has patient had a PCN reaction causing severe rash involving mucus membranes or skin necrosis: unsure Has patient had a PCN reaction that required hospitalization unsure Has patient had a PCN reaction occurring within the last 10 years: no If all of the above answers are "NO", then may proceed with Cephalosporin use.    Social History   Social History  . Marital status: Widowed    Spouse name: N/A  . Number of children: N/A  . Years of education: N/A   Occupational History  . Not on file.   Social History Main Topics  . Smoking status: Never Smoker  . Smokeless tobacco: Never Used  . Alcohol use Yes     Comment: OCC  . Drug use: Unknown  . Sexual activity: No   Other Topics Concern  . Not on file   Social History Narrative  . No narrative on file    Family History  Problem Relation Age of Onset  . CAD Mother   . Stroke Father     PHYSICAL EXAM: Vitals:   07/01/2016 2315 07/11/2016 2317  BP: 130/77   Pulse: 76   Resp: (!) 21   Temp:  97.5 F (36.4 C)   General:  Frail and chronically ill appearing. Intubated and sedated. Frothy secretions in ETT HEENT: normal Neck: supple. no JVD. Carotids 2+ bilat; no bruits. No lymphadenopathy or thryomegaly  appreciated. Cor: PMI nondisplaced. Regular rate & rhythm. No rubs, gallops or murmurs. Lungs: coarse rhonchorous breath sounds bilaterally  Abdomen: soft, nontender, nondistended. No hepatosplenomegaly. No bruits or masses. Good bowel sounds. Extremities: no cyanosis, clubbing, rash; trace bilateral edema Neuro: responds to voice; inconsistently follows simple commands  ECG 1/2 0036: NSR, anterior and lateral STE with reciprocal changes inferiorly  Results for orders placed or performed during the hospital encounter of 07/13/2016 (from the past 24 hour(s))  CBC     Status: Abnormal   Collection Time:  07/11/2016  8:51 PM  Result Value Ref Range   WBC 14.2 (H) 3.6 - 11.0 K/uL   RBC 4.75 3.80 - 5.20 MIL/uL   Hemoglobin 13.1 12.0 - 16.0 g/dL   HCT 39.3 35.0 - 47.0 %   MCV 82.8 80.0 - 100.0 fL   MCH 27.7 26.0 - 34.0 pg   MCHC 33.4 32.0 - 36.0 g/dL   RDW 15.1 (H) 11.5 - 14.5 %   Platelets 480 (H) 150 - 440 K/uL  Differential     Status: Abnormal   Collection Time: 07/10/2016  8:51 PM  Result Value Ref Range   Neutrophils Relative % 69 %   Neutro Abs 10.0 (H) 1.4 - 6.5 K/uL   Lymphocytes Relative 17 %   Lymphs Abs 2.4 1.0 - 3.6 K/uL   Monocytes Relative 11 %   Monocytes Absolute 1.5 (H) 0.2 - 0.9 K/uL   Eosinophils Relative 2 %   Eosinophils Absolute 0.2 0 - 0.7 K/uL   Basophils Relative 1 %   Basophils Absolute 0.1 0 - 0.1 K/uL  Protime-INR     Status: Abnormal   Collection Time: 07/04/2016  8:51 PM  Result Value Ref Range   Prothrombin Time 29.5 (H) 11.4 - 15.2 seconds   INR 2.73   APTT     Status: Abnormal   Collection Time: 07/03/2016  8:51 PM  Result Value Ref Range   aPTT 55 (H) 24 - 36 seconds  Comprehensive metabolic panel     Status: Abnormal   Collection Time: 06/29/2016  8:51 PM  Result Value Ref Range   Sodium 132 (L) 135 - 145 mmol/L   Potassium 4.3 3.5 - 5.1 mmol/L   Chloride 96 (L) 101 - 111 mmol/L   CO2 29 22 - 32 mmol/L   Glucose, Bld 191 (H) 65 - 99 mg/dL   BUN 17  6 - 20 mg/dL   Creatinine, Ser 0.62 0.44 - 1.00 mg/dL   Calcium 8.9 8.9 - 10.3 mg/dL   Total Protein 7.9 6.5 - 8.1 g/dL   Albumin 3.1 (L) 3.5 - 5.0 g/dL   AST 27 15 - 41 U/L   ALT 13 (L) 14 - 54 U/L   Alkaline Phosphatase 95 38 - 126 U/L   Total Bilirubin 0.8 0.3 - 1.2 mg/dL   GFR calc non Af Amer >60 >60 mL/min   GFR calc Af Amer >60 >60 mL/min   Anion gap 7 5 - 15  Troponin I     Status: Abnormal   Collection Time: 06/20/2016  8:51 PM  Result Value Ref Range   Troponin I 0.11 (HH) <0.03 ng/mL  Lipid panel     Status: Abnormal   Collection Time: 07/11/2016  8:51 PM  Result Value Ref Range   Cholesterol 202 (H) 0 - 200 mg/dL   Triglycerides 138 <150 mg/dL   HDL 72 >40 mg/dL   Total CHOL/HDL Ratio 2.8 RATIO   VLDL 28 0 - 40 mg/dL   LDL Cholesterol 102 (H) 0 - 99 mg/dL  Blood gas, arterial     Status: Abnormal (Preliminary result)   Collection Time: 07/09/2016  9:34 PM  Result Value Ref Range   FIO2 0.30    Delivery systems VENTILATOR    Mode ASSIST CONTROL    VT 450 mL   LHR 16 resp/min   Peep/cpap 5.0 cm H20   pH, Arterial 7.30 (L) 7.350 - 7.450   pCO2 arterial 55 (H) 32.0 - 48.0 mmHg   pO2,  Arterial 76 (L) 83.0 - 108.0 mmHg   Bicarbonate 27.1 20.0 - 28.0 mmol/L   Acid-base deficit 0.3 0.0 - 2.0 mmol/L   O2 Saturation 93.5 %   Patient temperature 37.0    Collection site PENDING    Sample type ARTERIAL DRAW    Allens test (pass/fail) PASS PASS   Dg Abdomen 1 View  Result Date: 06/20/2016 CLINICAL DATA:  Nasogastric tube placement. EXAM: ABDOMEN - 1 VIEW COMPARISON:  None. FINDINGS: Nasogastric tube is identified extending straight from the mid chest to the mid abdomen. This is is a unusual course for nasogastric tube. IVC filter is identified. Degenerative joint changes of the spine are noted. IMPRESSION: Nasogastric tube is identified extending straight from the mid chest to the mid abdomen. This is unusual course for nasogastric tube. Consider further evaluation with CT of  abdomen to assess placement prior to nasogastric tube use. These results will be called to the ordering clinician or representative by the Radiologist Assistant, and communication documented in the PACS or zVision Dashboard. Electronically Signed   By: Abelardo Diesel M.D.   On: 07/03/2016 21:55   Dg Chest Portable 1 View  Result Date: 07/01/2016 CLINICAL DATA:  Endotracheal tube placement EXAM: PORTABLE CHEST 1 VIEW COMPARISON:  March 09, 2016 FINDINGS: The heart size is mildly enlarged. The aorta is tortuous. Endotracheal tube is identified with distal tip probably 4 cm from carina but due to the rotation, carina location is difficult to assess. There is patchy consolidation of the left upper lobe, right lung base and inferior right upper lobe. There is no pleural effusion. There is no pneumothorax. A nasogastric tube is identified with distal tip either coiled in the proximal stomach or in the esophagus, the exact location cannot be a certain as a portion of the nasogastric tube is not included on the inferior portion of the film and there is overlying artifacts present in the same vicinity. The visualized skeletal structures are stable. IMPRESSION: Patchy opacities of the left upper lobe, right upper lobe and right lower lobe suspicious for pneumonia. Endotracheal tube is identified with distal tip probably 4 cm from carina but due to the rotation of the film, carinal location is difficult to assess. A nasogastric tube is identified with distal tip either coiled in the proximal stomach or in the esophagus, the exact location cannot be ascertained as a portion of the nasogastric tube is not included on the inferior portion of the film and there is overlying artifacts present in the same vicinity. Electronically Signed   By: Abelardo Diesel M.D.   On: 07/02/2016 21:47    Scheduled Meds: . aspirin EC  81 mg Oral Daily  . atorvastatin  80 mg Oral q1800  . aztreonam  2 g Intravenous Q8H  . chlorhexidine  gluconate (MEDLINE KIT)  15 mL Mouth Rinse BID  . famotidine (PEPCID) IV  20 mg Intravenous Q12H  . mouth rinse  15 mL Mouth Rinse QID  . potassium chloride  20 mEq Oral Once  . vancomycin  1,000 mg Intravenous Once  . [START ON Jul 11, 2016] vancomycin  1,000 mg Intravenous Q24H  . venlafaxine XR  75 mg Oral Q breakfast   Continuous Infusions: . heparin 850 Units/hr (06/19/2016 0000)  . propofol (DIPRIVAN) infusion Stopped (06/17/2016 0115)   PRN Meds:.acetaminophen, albuterol, fentaNYL (SUBLIMAZE) injection, fentaNYL (SUBLIMAZE) injection, ondansetron (ZOFRAN) IV   ASSESSMENT: [name] is a 83 y.o. female with hx of DVT and pAF on warfarin, anxiety, recent hip  fracture who presented to [facility] ED with respiratory failure in setting of STEMI.   PLAN/DISCUSSION:  #STEMI Given the constellation of recent unexplained hemoptysis, advanced age with impaired functional status, and therapeutic INR, consensus between [facility] ED physician and cath attending was to defer emergent coronary angiogram and manage medically. Family has been updated and agree this decision is prudent and have also made [place] aware that the patient is DNR and that the patient would not want heroic measures.  Based on ECG and her pulmonary edema, this is a large infarct and her prognosis is guarded. Not in overt shock, but blood pressure soft.  - continue medical management for ACS with ASA, heparin, statin. Hold P2Y12 for now - furosemide 40 IV x1 for pulmonary edema. She is diuretic naive and with preserved renal function so don't think she will have high diuretic requirement - TTE - cycle troponin - hold warfarin pending cath decision - no BB or ACE given tenuous blood pressure/resp failure - ongoing discussion with family on the clinical utility of coronary angiogram in the setting of the patients guarded prognosis and goals of care  #acute respiratory failure - critical care team consulted, appreciate  vent management and other recommendations - diuresis as above - minimal vent settings at present ([id number]/40/5)  #leukocytosis CXR read as possible pneumonia but I note fluid in the fissure on my read and clinical circumstances makes pulmonary edema much more likely. Nonetheless, given her critically ill status, will empirically treat for infection in the short-term - blood cultures x2, Ua/urine cx - procalcitonin protocol - vanc/aztreonam for now; extremely low threshold for stopping antibiotics based on culture/procalcitonin data  #fluids: none #electroylytes: replete prn #nutrition: npo; OG in place; repeat abdominal film ordered to confirm placement; will need tube feed consult if unable to wean ventilator in coming days #prophylaxis: heparin gtt, IV pepcid  DNR - signed paperwork presented by family and copied by nursing staff  Rexanne Mano, MD Cardiology

## 2016-06-14 NOTE — ED Notes (Addendum)
Family at bedside with ED provider

## 2016-06-15 ENCOUNTER — Encounter (HOSPITAL_COMMUNITY): Payer: Self-pay | Admitting: *Deleted

## 2016-06-15 ENCOUNTER — Inpatient Hospital Stay (HOSPITAL_COMMUNITY): Payer: Medicare Other

## 2016-06-15 ENCOUNTER — Other Ambulatory Visit: Payer: Self-pay

## 2016-06-15 DIAGNOSIS — Z7189 Other specified counseling: Secondary | ICD-10-CM

## 2016-06-15 DIAGNOSIS — J9621 Acute and chronic respiratory failure with hypoxia: Secondary | ICD-10-CM

## 2016-06-15 DIAGNOSIS — J9622 Acute and chronic respiratory failure with hypercapnia: Secondary | ICD-10-CM

## 2016-06-15 DIAGNOSIS — I249 Acute ischemic heart disease, unspecified: Secondary | ICD-10-CM

## 2016-06-15 DIAGNOSIS — I214 Non-ST elevation (NSTEMI) myocardial infarction: Secondary | ICD-10-CM

## 2016-06-15 DIAGNOSIS — Z515 Encounter for palliative care: Secondary | ICD-10-CM

## 2016-06-15 LAB — BASIC METABOLIC PANEL
ANION GAP: 11 (ref 5–15)
BUN: 14 mg/dL (ref 6–20)
CHLORIDE: 102 mmol/L (ref 101–111)
CO2: 25 mmol/L (ref 22–32)
Calcium: 8.1 mg/dL — ABNORMAL LOW (ref 8.9–10.3)
Creatinine, Ser: 0.66 mg/dL (ref 0.44–1.00)
GFR calc non Af Amer: >60 mL/min (ref >60)
Glucose, Bld: 136 mg/dL — ABNORMAL HIGH (ref 65–99)
POTASSIUM: 3.8 mmol/L (ref 3.5–5.1)
Sodium: 138 mmol/L (ref 135–145)

## 2016-06-15 LAB — BLOOD GAS, ARTERIAL
Acid-Base Excess: 2.7 mmol/L — ABNORMAL HIGH (ref 0.0–2.0)
BICARBONATE: 27 mmol/L (ref 20.0–28.0)
Drawn by: 44135
FIO2: 40
LHR: 16 {breaths}/min
O2 Saturation: 97.7 %
PATIENT TEMPERATURE: 98.6
PCO2 ART: 43.9 mmHg (ref 32.0–48.0)
PEEP: 5 cmH2O
VT: 450 mL
pH, Arterial: 7.406 (ref 7.350–7.450)
pO2, Arterial: 110 mmHg — ABNORMAL HIGH (ref 83.0–108.0)

## 2016-06-15 LAB — URINALYSIS, ROUTINE W REFLEX MICROSCOPIC
Bacteria, UA: NONE SEEN
Bilirubin Urine: NEGATIVE
Glucose, UA: NEGATIVE mg/dL
Ketones, ur: NEGATIVE mg/dL
Leukocytes, UA: NEGATIVE
Nitrite: NEGATIVE
PH: 7 (ref 5.0–8.0)
Protein, ur: NEGATIVE mg/dL
SPECIFIC GRAVITY, URINE: 1.005 (ref 1.005–1.030)
SQUAMOUS EPITHELIAL / LPF: NONE SEEN

## 2016-06-15 LAB — TRIGLYCERIDES: TRIGLYCERIDES: 71 mg/dL (ref <150)

## 2016-06-15 LAB — CBC
HCT: 33.9 % — ABNORMAL LOW (ref 36.0–46.0)
Hemoglobin: 10.9 g/dL — ABNORMAL LOW (ref 12.0–15.0)
MCH: 27.1 pg (ref 26.0–34.0)
MCHC: 32.2 g/dL (ref 30.0–36.0)
MCV: 84.3 fL (ref 78.0–100.0)
Platelets: 363 10*3/uL (ref 150–400)
RBC: 4.02 MIL/uL (ref 3.87–5.11)
RDW: 15.2 % (ref 11.5–15.5)
WBC: 16.2 10*3/uL — ABNORMAL HIGH (ref 4.0–10.5)

## 2016-06-15 LAB — BRAIN NATRIURETIC PEPTIDE: B NATRIURETIC PEPTIDE 5: 241.3 pg/mL — AB (ref 0.0–100.0)

## 2016-06-15 LAB — PROTIME-INR
INR: 3.53
Prothrombin Time: 36.2 s — ABNORMAL HIGH (ref 11.4–15.2)

## 2016-06-15 LAB — HEPARIN LEVEL (UNFRACTIONATED): HEPARIN UNFRACTIONATED: 0.28 [IU]/mL — AB (ref 0.30–0.70)

## 2016-06-15 LAB — MRSA PCR SCREENING: MRSA by PCR: NEGATIVE

## 2016-06-15 LAB — MAGNESIUM: MAGNESIUM: 1.3 mg/dL — AB (ref 1.7–2.4)

## 2016-06-15 LAB — PROCALCITONIN: PROCALCITONIN: 0.1 ng/mL

## 2016-06-15 LAB — TROPONIN I: Troponin I: 1.19 ng/mL (ref <0.03)

## 2016-06-15 MED ORDER — ATORVASTATIN CALCIUM 80 MG PO TABS: 80.0000 mg | ORAL_TABLET | Freq: Every day | ORAL | Status: DC

## 2016-06-15 MED ORDER — FENTANYL BOLUS VIA INFUSION
25.0000 ug | INTRAVENOUS | Status: DC | PRN
  Filled 2016-06-15: qty 75

## 2016-06-15 MED ORDER — ORAL CARE MOUTH RINSE
15.0000 mL | Freq: Four times a day (QID) | OROMUCOSAL | Status: DC
  Administered 2016-06-15 (×3): 15 mL via OROMUCOSAL

## 2016-06-15 MED ORDER — LORAZEPAM 2 MG/ML IJ SOLN
1.0000 mg | INTRAMUSCULAR | Status: AC
  Administered 2016-06-15: 1 mg via INTRAVENOUS

## 2016-06-15 MED ORDER — FENTANYL 2500MCG IN NS 250ML (10MCG/ML) PREMIX INFUSION
75.0000 ug/h | INTRAVENOUS | Status: DC
  Administered 2016-06-15: 200 ug/h via INTRAVENOUS
  Administered 2016-06-15: 40 ug/h via INTRAVENOUS
  Filled 2016-06-15 (×2): qty 250

## 2016-06-15 MED ORDER — FUROSEMIDE 10 MG/ML IJ SOLN
40.0000 mg | Freq: Once | INTRAMUSCULAR | Status: AC
  Administered 2016-06-15: 40 mg via INTRAVENOUS
  Filled 2016-06-15: qty 4

## 2016-06-15 MED ORDER — ASPIRIN EC 81 MG PO TBEC: 81.0000 mg | DELAYED_RELEASE_TABLET | Freq: Every day | ORAL | Status: DC

## 2016-06-15 MED ORDER — VENLAFAXINE HCL ER 75 MG PO CP24
75.0000 mg | ORAL_CAPSULE | Freq: Every day | ORAL | Status: DC
  Filled 2016-06-15: qty 1

## 2016-06-15 MED ORDER — ALBUTEROL SULFATE (2.5 MG/3ML) 0.083% IN NEBU: 2.5000 mg | INHALATION_SOLUTION | RESPIRATORY_TRACT | Status: DC | PRN

## 2016-06-15 MED ORDER — FENTANYL CITRATE (PF) 100 MCG/2ML IJ SOLN
INTRAMUSCULAR | Status: AC
  Administered 2016-06-15: 50 ug via INTRAVENOUS
  Filled 2016-06-15: qty 2

## 2016-06-15 MED ORDER — VANCOMYCIN HCL IN DEXTROSE 1-5 GM/200ML-% IV SOLN
1000.0000 mg | Freq: Once | INTRAVENOUS | Status: AC
  Administered 2016-06-15: 1000 mg via INTRAVENOUS
  Filled 2016-06-15: qty 200

## 2016-06-15 MED ORDER — MIRTAZAPINE 15 MG PO TABS: 7.5000 mg | ORAL_TABLET | Freq: Every day | ORAL | Status: DC

## 2016-06-15 MED ORDER — ASPIRIN 300 MG RE SUPP: 300.0000 mg | RECTAL | Status: DC

## 2016-06-15 MED ORDER — FENTANYL BOLUS VIA INFUSION
50.0000 ug | INTRAVENOUS | Status: DC | PRN
  Administered 2016-06-15 (×3): 100 ug via INTRAVENOUS
  Filled 2016-06-15: qty 100

## 2016-06-15 MED ORDER — PRO-STAT SUGAR FREE PO LIQD: 30.0000 mL | Freq: Two times a day (BID) | ORAL | Status: DC

## 2016-06-15 MED ORDER — HYDROMORPHONE HCL 1 MG/ML IJ SOLN
1.0000 mg | INTRAMUSCULAR | Status: DC | PRN
  Administered 2016-06-15 (×6): 1 mg via INTRAVENOUS
  Filled 2016-06-15 (×6): qty 1

## 2016-06-15 MED ORDER — ASPIRIN 81 MG PO CHEW: 324.0000 mg | CHEWABLE_TABLET | ORAL | Status: DC

## 2016-06-15 MED ORDER — FAMOTIDINE IN NACL 20-0.9 MG/50ML-% IV SOLN
20.0000 mg | Freq: Two times a day (BID) | INTRAVENOUS | Status: DC
  Administered 2016-06-15 (×2): 20 mg via INTRAVENOUS
  Filled 2016-06-15 (×2): qty 50

## 2016-06-15 MED ORDER — CHLORHEXIDINE GLUCONATE 0.12% ORAL RINSE (MEDLINE KIT)
15.0000 mL | Freq: Two times a day (BID) | OROMUCOSAL | Status: DC
  Administered 2016-06-15 (×2): 15 mL via OROMUCOSAL

## 2016-06-15 MED ORDER — GLYCOPYRROLATE 0.2 MG/ML IJ SOLN
0.2000 mg | INTRAMUSCULAR | Status: DC
  Administered 2016-06-15 (×3): 0.2 mg via INTRAVENOUS
  Filled 2016-06-15 (×5): qty 1

## 2016-06-15 MED ORDER — POTASSIUM CHLORIDE 20 MEQ/15ML (10%) PO SOLN
20.0000 meq | Freq: Once | ORAL | Status: AC
  Administered 2016-06-15: 20 meq via ORAL
  Filled 2016-06-15: qty 15

## 2016-06-15 MED ORDER — LORAZEPAM 2 MG/ML IJ SOLN
INTRAMUSCULAR | Status: AC
  Administered 2016-06-15: 1 mg via INTRAVENOUS
  Filled 2016-06-15: qty 1

## 2016-06-15 MED ORDER — MAGNESIUM SULFATE 4 GM/100ML IV SOLN
4.0000 g | Freq: Once | INTRAVENOUS | Status: AC
  Administered 2016-06-15: 4 g via INTRAVENOUS
  Filled 2016-06-15: qty 100

## 2016-06-15 MED ORDER — VANCOMYCIN HCL IN DEXTROSE 1-5 GM/200ML-% IV SOLN
1000.0000 mg | INTRAVENOUS | Status: DC
  Filled 2016-06-15: qty 200

## 2016-06-15 MED ORDER — LORAZEPAM 2 MG/ML IJ SOLN
1.0000 mg | INTRAMUSCULAR | Status: DC | PRN
  Administered 2016-06-15 (×5): 2 mg via INTRAVENOUS
  Filled 2016-06-15 (×5): qty 1

## 2016-06-15 MED ORDER — VITAL HIGH PROTEIN PO LIQD: 1000.0000 mL | ORAL | Status: DC

## 2016-06-15 MED ORDER — FENTANYL CITRATE (PF) 100 MCG/2ML IJ SOLN
50.0000 ug | INTRAMUSCULAR | Status: DC | PRN
  Filled 2016-06-15 (×2): qty 2

## 2016-06-15 MED ORDER — ASPIRIN 81 MG PO CHEW
81.0000 mg | CHEWABLE_TABLET | Freq: Every day | ORAL | Status: DC
  Filled 2016-06-15: qty 1

## 2016-06-15 MED ORDER — NITROGLYCERIN 0.4 MG SL SUBL: 0.4000 mg | SUBLINGUAL_TABLET | SUBLINGUAL | Status: DC | PRN

## 2016-06-15 MED ORDER — HEPARIN (PORCINE) IN NACL 100-0.45 UNIT/ML-% IJ SOLN: 900.0000 [IU]/h | INTRAMUSCULAR | Status: DC

## 2016-06-15 MED ORDER — FENTANYL CITRATE (PF) 100 MCG/2ML IJ SOLN
50.0000 ug | INTRAMUSCULAR | Status: DC | PRN
  Administered 2016-06-15 (×5): 50 ug via INTRAVENOUS
  Filled 2016-06-15 (×2): qty 2

## 2016-06-15 MED ORDER — DEXTROSE 5 % IV SOLN
2.0000 g | Freq: Three times a day (TID) | INTRAVENOUS | Status: DC
  Administered 2016-06-15 (×3): 2 g via INTRAVENOUS
  Filled 2016-06-15 (×4): qty 2

## 2016-06-15 MED ORDER — PROPOFOL 1000 MG/100ML IV EMUL
0.0000 ug/kg/min | INTRAVENOUS | Status: DC
  Administered 2016-06-15: 10 ug/kg/min via INTRAVENOUS
  Filled 2016-06-15: qty 100

## 2016-06-15 MED ORDER — ONDANSETRON HCL 4 MG/2ML IJ SOLN: 4.0000 mg | Freq: Four times a day (QID) | INTRAMUSCULAR | Status: DC | PRN

## 2016-06-15 MED ORDER — ACETAMINOPHEN 325 MG PO TABS: 650.0000 mg | ORAL_TABLET | ORAL | Status: DC | PRN

## 2016-06-15 NOTE — Consult Note (Signed)
Consultation Note Date: 07/03/2016   Patient Name: [name]  DOB: 11-Apr-1925  MRN: [id number]  Age / Sex: 83 y.o., female  PCP: Idelle Crouch, MD Referring Physician: Allene Pyo, MD  Reason for Consultation: Establishing goals of care  HPI/Patient Profile: 83 y.o. female  with past medical history of paroxysmal atrial fibrillation on warfarin, anxiety, left hip fracture and repair August 2017 with left thigh abscess Sept 2017 admitted on 07/06/2016 with respiratory failure and STEMI.   Clinical Assessment and Goals of Care: I met today with [name]'s multiple family members including her daughter and Chauncey Reading, Berline Chough. We had a conversation in which they reviewed her poor QOL and decline since her left hip fracture in August of this year. They say that she is a very private person and really hated having caregivers even 3 hours a day. She had stopped doing her bills and reading and has had cognitive decline the past few weeks.   Family all agree that she would not want to continue living the way she has been and they know best case scenario that this MI will take it's toll on her QOL. We discussed the next step towards comfort would be for extubation. They all agree and would like to extubate as soon as they call family to be here.   I came back later after initiation of fentanyl infusion and family is prepared to extubate. Fentanyl at 40 mcg/hr and she is extubated to 2L [place] and appears comfortable. Discussed with family as she appears extremely frail and exhausted but stable.  I was called back to the room by RN as [name] is having more symptoms/labored breathing. She has received fentanyl bolus up to 75 mcg with little relief (~10 min relief with 75 mcg fentanyl per RN). We increased infusion to 75 mcg/hr and increased bolus range. She also appears frightened and anxious so ativan given.  Appearing more comfortable but progressing quickly towards EOL with HR and O2 sats dropping into 50-60s. Explained to family that she is unlikely to live much longer. Emotional support provided.   Primary Decision Maker HCPOA daughter Berline Chough    SUMMARY OF RECOMMENDATIONS   Extubate when family arrives and transition to comfort care  Code Status/Advance Care Planning:  DNR   Symptom Management:   Dyspnea/pain: Fentanyl infusion titrated to comfort as described above. Bolus via infusion to comfort.   Anxiety: Ativan 1-2 mg every hour prn.   Secretions: Robinul 0.2 every 4 hours.   Palliative Prophylaxis:   Aspiration, Frequent Pain Assessment and Oral Care  Additional Recommendations (Limitations, Scope, Preferences):  Full Comfort Care  Psycho-social/Spiritual:   Desire for further Chaplaincy support:yes  Additional Recommendations: Grief/Bereavement Support  Prognosis:   Likely hours only.   Discharge Planning: Anticipated Hospital Death      Primary Diagnoses: Present on Admission: . Acute coronary syndrome ([place])   I have reviewed the medical record, interviewed the patient and family, and examined the patient. The following aspects are pertinent.  Past Medical  History:  Diagnosis Date  . Arthritis   . Asthma   . Cancer (HCC)    BREAST  . Cough    CHRONIC  . Depression   . Dizziness   . DVT (deep venous thrombosis) ([place])   . Edema    FEET/LEGS  . GERD (gastroesophageal reflux disease)   . Heart murmur   . Heart palpitations   . Hepatitis   . HOH (hard of hearing)   . Hyperlipidemia   . Hypertension   . Osteoporosis   . Shortness of breath dyspnea   . Tremors of nervous system    Social History   Social History  . Marital status: Widowed    Spouse name: N/A  . Number of children: N/A  . Years of education: N/A   Social History Main Topics  . Smoking status: Never Smoker  . Smokeless tobacco: Never Used  . Alcohol use Yes      Comment: OCC  . Drug use: Unknown  . Sexual activity: No   Other Topics Concern  . None   Social History Narrative  . None   Family History  Problem Relation Age of Onset  . CAD Mother   . Stroke Father    Scheduled Meds: . aztreonam  2 g Intravenous Q8H  . chlorhexidine gluconate (MEDLINE KIT)  15 mL Mouth Rinse BID  . famotidine (PEPCID) IV  20 mg Intravenous Q12H  . glycopyrrolate  0.2 mg Intravenous Q4H  . mouth rinse  15 mL Mouth Rinse QID  . [START ON 2016-07-03] vancomycin  1,000 mg Intravenous Q24H   Continuous Infusions: . fentaNYL infusion INTRAVENOUS    . propofol (DIPRIVAN) infusion 10.049 mcg/kg/min (07/10/2016 0800)   PRN Meds:.albuterol, fentaNYL, fentaNYL (SUBLIMAZE) injection, fentaNYL (SUBLIMAZE) injection, ondansetron (ZOFRAN) IV Allergies  Allergen Reactions  . Morphine And Related Nausea And Vomiting  . Penicillins Other (See Comments)    Unknown Has patient had a PCN reaction causing immediate rash, facial/tongue/throat swelling, SOB or lightheadedness with hypotension: unsure Has patient had a PCN reaction causing severe rash involving mucus membranes or skin necrosis: unsure Has patient had a PCN reaction that required hospitalization unsure Has patient had a PCN reaction occurring within the last 10 years: no If all of the above answers are "NO", then may proceed with Cephalosporin use.   Review of Systems  Unable to perform ROS: Acuity of condition    Physical Exam  Constitutional: She appears well-developed. She appears lethargic.  HENT:  Head: Normocephalic and atraumatic.  Cardiovascular: Normal rate and regular rhythm.   Pulmonary/Chest: No accessory muscle usage. No tachypnea. She is in respiratory distress. She has rhonchi.  Abdominal: Normal appearance.  Neurological: She appears lethargic. She is disoriented.  Nursing note and vitals reviewed.   Vital Signs: BP (!) 117/55   Pulse (!) 59   Temp 98 F (36.7 C) (Axillary)   Resp 16    Ht 5' 4" (1.626 m)   Wt 66.8 kg (147 lb 4.3 oz)   SpO2 99%   BMI 25.28 kg/m  Pain Assessment: CPOT       SpO2: SpO2: 99 % O2 Device:SpO2: 99 % O2 Flow Rate: .   IO: Intake/output summary:  Intake/Output Summary (Last 24 hours) at 06/17/2016 1444 Last data filed at 07/03/2016 1329  Gross per 24 hour  Intake           511.75 ml  Output             2325 ml  Net         -1813.25 ml    LBM: Last BM Date:  (unknown, prior to admission) Baseline Weight: Weight: 68 kg (149 lb 14.6 oz) Most recent weight: Weight: 66.8 kg (147 lb 4.3 oz)     Palliative Assessment/Data:     Time In/Out: 1330-1430, 1550-1610, 1700-1730 Time Total: 132mn Greater than 50%  of this time was spent counseling and coordinating care related to the above assessment and plan.  Signed by: AVinie Sill NP Palliative Medicine Team Pager # [phone](M-F 8a-5p) Team Phone # [phone](Nights/Weekends)

## 2016-06-15 NOTE — Procedures (Signed)
Extubation Procedure Note  Patient Details:   Name: TAMMICA BERGMANN DOB: 04-22-1925 MRN: [id number]   Airway Documentation:     Evaluation  O2 sats: currently acceptable Complications: No apparent complications Patient did tolerate procedure well. Bilateral Breath Sounds: Rhonchi, Diminished   No Pt extubated using withdrawal of care guidelines.   Hope Pigeon, MA 07/12/2016, 4:01 PM

## 2016-06-15 NOTE — Progress Notes (Addendum)
Pt expired at 2211. Pronounced by Eleonore Chiquito RN Gladys Damme RN Family in room with pt.  Chaplin offered. Cards fellow paged and notified of death.  MD to sign death certificate in AM

## 2016-06-15 NOTE — Consult Note (Addendum)
PULMONARY / CRITICAL CARE MEDICINE   Name: [name] MRN: [id number] DOB: 07-27-1924    ADMISSION DATE:  06/19/2016 CONSULTATION DATE:  1/2  REFERRING MD:  Dr. Carlota Raspberry Cardiology fellow  CHIEF COMPLAINT:  STEMI  HISTORY OF PRESENT ILLNESS:   Patient is encephalopathic and/or intubated. Therefore history has been obtained from chart review. 83 year old female with PMH as below, which is significant for PAF on warfarin, HLD, HTN, and DVT. She resides at assisted living facility. She spent new years day with her family where she voiced no complaints and per their reports she was acting normally. After returning to her assisted living facility she pressed her "emergency button" calling the staff. Upon EMS arrival she was in profound respiratory distress and was reportedly having some hemoptysis. Upon arrival to ED she had 12 lead EKG concerning for ST elevation. She required intubation for respiratory distress and was admitted to the cardiology service. PCCM to see in consultation.   PAST MEDICAL HISTORY :  She  has a past medical history of Arthritis; Asthma; Cancer ([place]); Cough; Depression; Dizziness; DVT (deep venous thrombosis) ([place]); Edema; GERD (gastroesophageal reflux disease); Heart murmur; Heart palpitations; Hepatitis; HOH (hard of hearing); Hyperlipidemia; Hypertension; Osteoporosis; Shortness of breath dyspnea; and Tremors of nervous system.  PAST SURGICAL HISTORY: She  has a past surgical history that includes Tonsillectomy; Appendectomy; Tubal ligation; Cholecystectomy; Hernia repair; Nose surgery; Knee arthroscopy; IVC FILTER PLACEMENT ([facility] HX); External ear surgery; Breast surgery; Mastectomy (Left); Cataract extraction w/PHACO (Right, 12/18/2015); Eye surgery; Cataract extraction w/PHACO (Left, 02/05/2016); Intramedullary (im) nail intertrochanteric (Left, 02/11/2016); and Incision and drainage (Left, 03/07/2016).  Allergies  Allergen Reactions  . Morphine And Related Nausea And  Vomiting  . Penicillins Other (See Comments)    Unknown Has patient had a PCN reaction causing immediate rash, facial/tongue/throat swelling, SOB or lightheadedness with hypotension: unsure Has patient had a PCN reaction causing severe rash involving mucus membranes or skin necrosis: unsure Has patient had a PCN reaction that required hospitalization unsure Has patient had a PCN reaction occurring within the last 10 years: no If all of the above answers are "NO", then may proceed with Cephalosporin use.    No current facility-administered medications on file prior to encounter.    Current Outpatient Prescriptions on File Prior to Encounter  Medication Sig  . acetaminophen (TYLENOL) 500 MG tablet Take 500 mg by mouth every 6 (six) hours as needed for mild pain.  . Cholecalciferol (VITAMIN D3) 1000 units CAPS Take 4 capsules by mouth daily.   . clindamycin (CLEOCIN) 300 MG capsule Take 1 capsule (300 mg total) by mouth 3 (three) times daily.  . Difluprednate (DUREZOL) 0.05 % EMUL Apply 1 drop to eye 2 (two) times daily. LEFT eye   . ENSURE (ENSURE) Take 237 mLs by mouth.  . ferrous sulfate 325 (65 FE) MG tablet Take 1 tablet (325 mg total) by mouth 2 (two) times daily with a meal.  . ipratropium-albuterol (DUONEB) 0.5-2.5 (3) MG/3ML SOLN Take 3 mLs by nebulization every 6 (six) hours as needed.  Marland Kitchen LEVOFLOXACIN IV Inject 750 mg into the vein daily. For cellulitis  . meclizine (ANTIVERT) 12.5 MG tablet Take 12.5 mg by mouth every 6 (six) hours as needed for dizziness.  . methocarbamol (ROBAXIN) 500 MG tablet Take 500 mg by mouth every 6 (six) hours as needed for muscle spasms.  . mirtazapine (REMERON) 7.5 MG tablet Take 7.5 mg by mouth at bedtime.  . moxifloxacin (VIGAMOX) 0.5 % ophthalmic  solution Place 1 drop into the left eye 4 (four) times daily.  Marland Kitchen oxyCODONE (OXY IR/ROXICODONE) 5 MG immediate release tablet Take 1-2 tablets (5-10 mg total) by mouth every 4 (four) hours as needed for  breakthrough pain ((for MODERATE breakthrough pain)).  Marland Kitchen propranolol ER (INDERAL LA) 60 MG 24 hr capsule Take 60 mg by mouth daily.  . sennosides-docusate sodium (SENOKOT-S) 8.6-50 MG tablet Take 1 tablet by mouth 2 (two) times daily.  Marland Kitchen venlafaxine XR (EFFEXOR-XR) 75 MG 24 hr capsule Take 75 mg by mouth daily with breakfast.  . vitamin B-12 (CYANOCOBALAMIN) 1000 MCG tablet Take 1,000 mcg by mouth daily.  Marland Kitchen warfarin (COUMADIN) 1 MG tablet Take 0.5 mg by mouth daily.   Marland Kitchen warfarin (COUMADIN) 3 MG tablet Take 3 mg by mouth daily.    FAMILY HISTORY:  Her indicated that the status of her mother is unknown. She indicated that the status of her father is unknown.    SOCIAL HISTORY: She  reports that she has never smoked. She has never used smokeless tobacco. She reports that she drinks alcohol.  REVIEW OF SYSTEMS:  Patient is encephalopathic and/or intubated.   SUBJECTIVE:    VITAL SIGNS: BP (!) 99/50   Pulse 65   Temp 97.5 F (36.4 C) (Axillary)   Resp 19   Ht 5\' 4"  (1.626 m)   Wt 68 kg (149 lb 14.6 oz)   SpO2 100%   BMI 25.73 kg/m   HEMODYNAMICS:    VENTILATOR SETTINGS: Vent Mode: PRVC FiO2 (%):  [30 %-40 %] 40 % Set Rate:  [16 bmp] 16 bmp Vt Set:  [450 mL] 450 mL PEEP:  [5 cmH20] 5 cmH20 Plateau Pressure:  [13 cmH20] 13 cmH20  INTAKE / OUTPUT: No intake/output data recorded.  PHYSICAL EXAMINATION: General:  Frail elderly female Neuro:  Agitated on ventilator HEENT:  [place]/AT, PERRL, no JVD Cardiovascular:  Tachy, regular, no MRG Lungs:  Coarse Abdomen:  Soft, non-tender, non-distended Musculoskeletal:  No acute deformity or ROM limitation Skin:  Grossly intact  LABS:  BMET  Recent Labs Lab 07/03/2016 2051  NA 132*  K 4.3  CL 96*  CO2 29  BUN 17  CREATININE 0.62  GLUCOSE 191*    Electrolytes  Recent Labs Lab 06/25/2016 2051  CALCIUM 8.9    CBC  Recent Labs Lab 07/02/2016 2051  WBC 14.2*  HGB 13.1  HCT 39.3  PLT 480*    Coag's  Recent  Labs Lab 07/08/2016 2051  APTT 55*  INR 2.73    Sepsis Markers No results for input(s): LATICACIDVEN, PROCALCITON, O2SATVEN in the last 168 hours.  ABG  Recent Labs Lab 07/06/2016 2134  PHART 7.30*  PCO2ART 55*  PO2ART 76*    Liver Enzymes  Recent Labs Lab 07/03/2016 2051  AST 27  ALT 13*  ALKPHOS 95  BILITOT 0.8  ALBUMIN 3.1*    Cardiac Enzymes  Recent Labs Lab 07/10/2016 2051  TROPONINI 0.11*    Glucose No results for input(s): GLUCAP in the last 168 hours.  Imaging Dg Abdomen 1 View  Result Date: 06/25/2016 CLINICAL DATA:  Nasogastric tube placement. EXAM: ABDOMEN - 1 VIEW COMPARISON:  None. FINDINGS: Nasogastric tube is identified extending straight from the mid chest to the mid abdomen. This is is a unusual course for nasogastric tube. IVC filter is identified. Degenerative joint changes of the spine are noted. IMPRESSION: Nasogastric tube is identified extending straight from the mid chest to the mid abdomen. This is unusual course for  nasogastric tube. Consider further evaluation with CT of abdomen to assess placement prior to nasogastric tube use. These results will be called to the ordering clinician or representative by the Radiologist Assistant, and communication documented in the PACS or zVision Dashboard. Electronically Signed   By: Abelardo Diesel M.D.   On: 06/19/2016 21:55   Dg Chest Portable 1 View  Result Date: 07/04/2016 CLINICAL DATA:  Endotracheal tube placement EXAM: PORTABLE CHEST 1 VIEW COMPARISON:  March 09, 2016 FINDINGS: The heart size is mildly enlarged. The aorta is tortuous. Endotracheal tube is identified with distal tip probably 4 cm from carina but due to the rotation, carina location is difficult to assess. There is patchy consolidation of the left upper lobe, right lung base and inferior right upper lobe. There is no pleural effusion. There is no pneumothorax. A nasogastric tube is identified with distal tip either coiled in the proximal  stomach or in the esophagus, the exact location cannot be a certain as a portion of the nasogastric tube is not included on the inferior portion of the film and there is overlying artifacts present in the same vicinity. The visualized skeletal structures are stable. IMPRESSION: Patchy opacities of the left upper lobe, right upper lobe and right lower lobe suspicious for pneumonia. Endotracheal tube is identified with distal tip probably 4 cm from carina but due to the rotation of the film, carinal location is difficult to assess. A nasogastric tube is identified with distal tip either coiled in the proximal stomach or in the esophagus, the exact location cannot be ascertained as a portion of the nasogastric tube is not included on the inferior portion of the film and there is overlying artifacts present in the same vicinity. Electronically Signed   By: Abelardo Diesel M.D.   On: 07/11/2016 21:47     STUDIES:    CULTURES: Sputum 1/2 > Blood 1/2 >  ANTIBIOTICS: Aztreonam 1/2 > Vancomycin 1/2 >  SIGNIFICANT EVENTS: 1/1 admit for STEMI  LINES/TUBES: ETT 1/1 >   ASSESSMENT / PLAN:  PULMONARY A: Acute hypoxemic respiratory failure secondary to pulmonary edema vs less likely PNA ?Hemoptsysis, more likely edema  P:   Full vent support CXR for ETT placemetn ABG VAP prevention bundle  CARDIOVASCULAR A:  STEMI PAF Acute CHF  P:  Cardiology primary Diuresing per primary Medical management only for now DNR if arrests Trend troponin  RENAL A:   No acute issues  P:   Follow BMP  GASTROINTESTINAL A:   GERD  P:   Pepcid BID NPO  HEMATOLOGIC A:   Warfarin coagulopathy  P:  heparin infusion per priamry  INFECTIOUS A:   ?HCAP  P:   Continue ABX Trend PCT Low threshold to deescalate  ENDOCRINE A:   Hyperglycemia with no history of DM  P:   CBG monitoring and SSI  NEUROLOGIC A:   Acute metabolic encephalopathy  P:   RASS goal -1 to -2 Propofol  infusion PRN fentanyl  FAMILY  - Updates: Family updated 1/2  - Inter-disciplinary family meet or Palliative Care meeting due by:  1/8   Georgann Housekeeper, AGACNP-BC [place] [facility] Pager [phone] or [phone]  07/14/2016 12:49 AM   ATTENDING NOTE / ATTESTATION NOTE :   I have discussed the case with the resident/APP  Melynda Keller NP.   I agree with the resident/APP's  history, physical examination, assessment, and plans.    I have edited the above note and modified it according to our agreed  history, physical examination, assessment and plan.   Briefly,  83 year old female with PMH as below, which is significant for PAF on warfarin, HLD, HTN, and DVT admitted with acute SOB and Acute STEMI.  Intubated for resp distress. CXR with PNA as well.  She presented with hemoptysis.   PE as above. BP 101/46, HR 60, RR 18, 97% on 30%.  Pt seen comfortable, NAD. (-) subjective complaints.  Good ae on BLF. Some crackles at bases. Good s1/s2. (-) m/r/g. (+) BS. Soft, NT. warme extremities. (-) edema  Assessment/Plan  Acute STEMI. Acute Hypoxemic resp failure 2/2 STEMI and possible PNA (multilobar).  Conservative management of STEMI. Cont ventilator support.  Not ready for weaning.  On heparin drip.  On abx > deescalate once with cultures.   Hemoptysis likely related to pulmonary edema. S/P lasix. No recurrence. Will observe. PCT is reassuring. CXR with possible "infiltrates". Anticipate we can deescalate abx (Vanc/Aztreonam) in next 24 hrs. Rpt cxr in am.   Acute pulm edema. S/P diuresis. Will observe for now.   Start TF.   I have spent 30  minutes of critical care time with this patient today.  Family :Family updated at length today.  Pt is a DNR. Family requesting palliative Care consultation.    Monica Becton, MD 07/11/2016, 10:13 AM [facility] Pager [phone] After 3 pm or if no answer, call [phone]

## 2016-06-15 NOTE — Progress Notes (Signed)
ANTICOAGULATION/ANTIBIOTIC CONSULT NOTE - Initial Consult  Pharmacy Consult for heparin and vancomycin/aztreonam Indication: ACS/DVT and r/o PNA  Allergies  Allergen Reactions  . Morphine And Related Nausea And Vomiting  . Penicillins Other (See Comments)    Unknown Has patient had a PCN reaction causing immediate rash, facial/tongue/throat swelling, SOB or lightheadedness with hypotension: unsure Has patient had a PCN reaction causing severe rash involving mucus membranes or skin necrosis: unsure Has patient had a PCN reaction that required hospitalization unsure Has patient had a PCN reaction occurring within the last 10 years: no If all of the above answers are "NO", then may proceed with Cephalosporin use.    Patient Measurements: Height: _0  (162.6 cm) Weight: 149 lb 14.6 oz (68 kg) IBW/kg (Calculated) : 54.7  Vital Signs: Temp: 97.5 F (36.4 C) (01/01 2317) Temp Source: Axillary (01/01 2317) BP: 110/68 (01/02 0000) Pulse Rate: 65 (01/02 0000)  Labs:  Recent Labs  06/29/2016 2051  HGB 13.1  HCT 39.3  PLT 480*  APTT 55*  LABPROT 29.5*  INR 2.73  CREATININE 0.62  TROPONINI 0.11*    Estimated Creatinine Clearance: 45.2 mL/min (by C-G formula based on SCr of 0.62 mg/dL).   Medical History: Past Medical History:  Diagnosis Date  . Arthritis   . Asthma   . Cancer ([facility])    BREAST  . Cough    CHRONIC  . Depression   . Dizziness   . DVT (deep venous thrombosis) ([place])   . Edema    FEET/LEGS  . GERD (gastroesophageal reflux disease)   . Heart murmur   . Heart palpitations   . Hepatitis   . HOH (hard of hearing)   . Hyperlipidemia   . Hypertension   . Osteoporosis   . Shortness of breath dyspnea   . Tremors of nervous system     Medications:  Prescriptions Prior to Admission  Medication Sig Dispense Refill Last Dose  . acetaminophen (TYLENOL) 500 MG tablet Take 500 mg by mouth every 6 (six) hours as needed for mild pain.   prn at prn  .  Cholecalciferol (VITAMIN D3) 1000 units CAPS Take 4 capsules by mouth daily.    03/06/2016 at 0800  . clindamycin (CLEOCIN) 300 MG capsule Take 1 capsule (300 mg total) by mouth 3 (three) times daily. 12 capsule 0   . Difluprednate (DUREZOL) 0.05 % EMUL Apply 1 drop to eye 2 (two) times daily. LEFT eye    03/06/2016 at 0800  . ENSURE (ENSURE) Take 237 mLs by mouth.   03/05/2016 at 1500  . ferrous sulfate 325 (65 FE) MG tablet Take 1 tablet (325 mg total) by mouth 2 (two) times daily with a meal.  3 03/06/2016 at 0800  . ipratropium-albuterol (DUONEB) 0.5-2.5 (3) MG/3ML SOLN Take 3 mLs by nebulization every 6 (six) hours as needed. 120 mL 0   . LEVOFLOXACIN IV Inject 750 mg into the vein daily. For cellulitis   03/05/2016 at 2100  . meclizine (ANTIVERT) 12.5 MG tablet Take 12.5 mg by mouth every 6 (six) hours as needed for dizziness.   prn at prn  . methocarbamol (ROBAXIN) 500 MG tablet Take 500 mg by mouth every 6 (six) hours as needed for muscle spasms.   prn at prn  . mirtazapine (REMERON) 7.5 MG tablet Take 7.5 mg by mouth at bedtime.   03/05/2016 at 2000  . moxifloxacin (VIGAMOX) 0.5 % ophthalmic solution Place 1 drop into the left eye 4 (four) times daily.  Not Taking at Unknown time  . oxyCODONE (OXY IR/ROXICODONE) 5 MG immediate release tablet Take 1-2 tablets (5-10 mg total) by mouth every 4 (four) hours as needed for breakthrough pain ((for MODERATE breakthrough pain)). 60 tablet 0   . propranolol ER (INDERAL LA) 60 MG 24 hr capsule Take 60 mg by mouth daily.   03/05/2016 at 0900  . sennosides-docusate sodium (SENOKOT-S) 8.6-50 MG tablet Take 1 tablet by mouth 2 (two) times daily.   unknown at unknown  . venlafaxine XR (EFFEXOR-XR) 75 MG 24 hr capsule Take 75 mg by mouth daily with breakfast.   03/05/2016 at 2000  . vitamin B-12 (CYANOCOBALAMIN) 1000 MCG tablet Take 1,000 mcg by mouth daily.   03/05/2016 at 0800  . warfarin (COUMADIN) 1 MG tablet Take 0.5 mg by mouth daily.   0 03/05/2016 at 1800  .  warfarin (COUMADIN) 3 MG tablet Take 3 mg by mouth daily.   03/05/2016 at 1800   Scheduled:  . aspirin  324 mg Oral NOW   Or  . aspirin  300 mg Rectal NOW  . aspirin EC  81 mg Oral Daily  . chlorhexidine gluconate (MEDLINE KIT)  15 mL Mouth Rinse BID  . mouth rinse  15 mL Mouth Rinse QID  . mirtazapine  7.5 mg Oral QHS  . venlafaxine XR  75 mg Oral Q breakfast   Infusions:  . propofol (DIPRIVAN) infusion      Assessment: 83yo female on Coumadin PTA w/ IVC filter presents to [facility] for acute respiratory failure, found w/ ST elevation, decision made to treat medically, to start on heparin gtt despite elevated INR (cards aware).  Also concern for sepsis, to begin IV ABX.  Goal of Therapy:  Heparin level 0.3-0.7 units/ml Vanc trough 15-20 Monitor platelets by anticoagulation protocol: Yes   Plan:  [facility] started heparin gtt w/ 4000 unit bolus then 850 units/hr; will continue for now and monitor heparin levels and CBC.  Will start vancomycin 1076m IV Q24H and aztreonam 2g IV Q8H and monitor CBC, Cx, levels prn.  VWynona Neat PharmD, BCPS  06/21/2016,12:20 AM

## 2016-06-15 NOTE — Progress Notes (Signed)
176ml of fentyal wasted in sink. Gladys Damme RN witnessed Eleonore Chiquito RN

## 2016-06-15 NOTE — Progress Notes (Signed)
ANTICOAGULATION CONSULT NOTE - Follow Up Consult  Pharmacy Consult for heparin Indication: ACS/DVT  Labs:  Recent Labs  06/19/2016 2051 06/20/2016 0036 06/22/2016 0220 06/19/2016 0600  HGB 13.1 10.9*  --   --   HCT 39.3 33.9*  --   --   PLT 480* 363  --   --   APTT 55*  --   --   --   LABPROT 29.5* 36.2*  --   --   INR 2.73 3.53  --   --   HEPARINUNFRC  --   --   --  0.28*  CREATININE 0.62 0.66  --   --   TROPONINI 0.11*  --  1.19*  --     Assessment: 83yo female subtherapeutic on heparin with initial dosing while Coumadin on hold.  Goal of Therapy:  Heparin level 0.3-0.7 units/ml   Plan:  Will increase heparin gtt slightly to 900 units/hr and check level in [place], PharmD, BCPS  07/10/2016,8:10 AM

## 2016-06-15 NOTE — Progress Notes (Addendum)
Patient Name: [name] Date of Encounter: 07/05/2016  Primary Cardiologist: [facility] Problem List     Active Problems:   Acute coronary syndrome (HCC)     Subjective   Intubated, sedated. Opens eyes to name   Inpatient Medications    Scheduled Meds: . aspirin EC  81 mg Oral Daily  . atorvastatin  80 mg Oral q1800  . aztreonam  2 g Intravenous Q8H  . chlorhexidine gluconate (MEDLINE KIT)  15 mL Mouth Rinse BID  . famotidine (PEPCID) IV  20 mg Intravenous Q12H  . magnesium sulfate 1 - 4 g bolus IVPB  4 g Intravenous Once  . mouth rinse  15 mL Mouth Rinse QID  . [START ON June 19, 2016] vancomycin  1,000 mg Intravenous Q24H  . venlafaxine XR  75 mg Oral Q breakfast   Continuous Infusions: . heparin 850 Units/hr (07/12/2016 0000)  . propofol (DIPRIVAN) infusion 10 mcg/kg/min (07/01/2016 0658)   PRN Meds: acetaminophen, albuterol, fentaNYL (SUBLIMAZE) injection, fentaNYL (SUBLIMAZE) injection, ondansetron (ZOFRAN) IV   Vital Signs    Vitals:   06/24/2016 0500 06/19/2016 0600 07/02/2016 0700 07/03/2016 0737  BP: (!) 100/55 124/61 (!) 113/55 (!) 113/55  Pulse: 60 69 64 66  Resp: 18 18 17 16   Temp:      TempSrc:      SpO2: 99% 98% 99% 99%  Weight:  147 lb 4.3 oz (66.8 kg)    Height:        Intake/Output Summary (Last 24 hours) at 06/25/2016 0750 Last data filed at 06/18/2016 0700  Gross per 24 hour  Intake           490.65 ml  Output             1850 ml  Net         -1359.35 ml   Filed Weights   07/03/2016 2315 07/13/2016 0600  Weight: 149 lb 14.6 oz (68 kg) 147 lb 4.3 oz (66.8 kg)    Physical Exam    GEN: Elderly, intubated female  HEENT: Grossly normal.  Neck: Supple, no JVD, carotid bruits, or masses. ETT in place.  Cardiac: RRR, no murmurs, rubs, or gallops. No clubbing, cyanosis,  Trace lower extremity edema.  Radials/DP/PT 2+ and equal bilaterally.  Respiratory:  Respirations regular and unlabored. Rhonchi in all lobes.  GI: Soft, nontender, nondistended, BS  + x 4. MS: no deformity or atrophy. Skin: warm and dry, no rash. Neuro:  Strength and sensation are intact. Psych: Cannot assess due to intubation.   Labs    CBC  Recent Labs  06/22/2016 2051 06/21/2016 0036  WBC 14.2* 16.2*  NEUTROABS 10.0*  --   HGB 13.1 10.9*  HCT 39.3 33.9*  MCV 82.8 84.3  PLT 480* 623   Basic Metabolic Panel  Recent Labs  07/07/2016 2051 07/09/2016 0036 06/17/2016 0220  NA 132* 138  --   K 4.3 3.8  --   CL 96* 102  --   CO2 29 25  --   GLUCOSE 191* 136*  --   BUN 17 14  --   CREATININE 0.62 0.66  --   CALCIUM 8.9 8.1*  --   MG  --   --  1.3*   Liver Function Tests  Recent Labs  06/19/2016 2051  AST 27  ALT 13*  ALKPHOS 95  BILITOT 0.8  PROT 7.9  ALBUMIN 3.1*  Cardiac Enzymes  Recent Labs  07/11/2016 2051 07/09/2016 0220  TROPONINI 0.11* 1.19*  Fasting Lipid Panel  Recent Labs  07/11/2016 2051 07/11/2016 0036  CHOL 202*  --   HDL 72  --   LDLCALC 102*  --   TRIG 138 71  CHOLHDL 2.8  --    BNP (last 3 results)  Recent Labs  07/11/2016 0220  BNP 241.3*    ProBNP (last 3 results) No results for input(s): PROBNP in the last 8760 hours.   Telemetry   NSR, Sinus brady with ST depression - Personally Reviewed  ECG     NSR with anterolateral ST elevation - Personally Reviewed  Radiology    Dg Abdomen 1 View  Result Date: 07/01/2016 CLINICAL DATA:  Nasogastric tube placement. EXAM: ABDOMEN - 1 VIEW COMPARISON:  None. FINDINGS: Nasogastric tube is identified extending straight from the mid chest to the mid abdomen. This is is a unusual course for nasogastric tube. IVC filter is identified. Degenerative joint changes of the spine are noted. IMPRESSION: Nasogastric tube is identified extending straight from the mid chest to the mid abdomen. This is unusual course for nasogastric tube. Consider further evaluation with CT of abdomen to assess placement prior to nasogastric tube use. These results will be called to the ordering clinician or  representative by the Radiologist Assistant, and communication documented in the PACS or zVision Dashboard. Electronically Signed   By: Abelardo Diesel M.D.   On: 06/24/2016 21:55   Dg Chest Portable 1 View  Result Date: 07/06/2016 CLINICAL DATA:  Endotracheal tube placement EXAM: PORTABLE CHEST 1 VIEW COMPARISON:  March 09, 2016 FINDINGS: The heart size is mildly enlarged. The aorta is tortuous. Endotracheal tube is identified with distal tip probably 4 cm from carina but due to the rotation, carina location is difficult to assess. There is patchy consolidation of the left upper lobe, right lung base and inferior right upper lobe. There is no pleural effusion. There is no pneumothorax. A nasogastric tube is identified with distal tip either coiled in the proximal stomach or in the esophagus, the exact location cannot be a certain as a portion of the nasogastric tube is not included on the inferior portion of the film and there is overlying artifacts present in the same vicinity. The visualized skeletal structures are stable. IMPRESSION: Patchy opacities of the left upper lobe, right upper lobe and right lower lobe suspicious for pneumonia. Endotracheal tube is identified with distal tip probably 4 cm from carina but due to the rotation of the film, carinal location is difficult to assess. A nasogastric tube is identified with distal tip either coiled in the proximal stomach or in the esophagus, the exact location cannot be ascertained as a portion of the nasogastric tube is not included on the inferior portion of the film and there is overlying artifacts present in the same vicinity. Electronically Signed   By: Abelardo Diesel M.D.   On: 07/10/2016 21:47   Dg Abd Portable 1v  Result Date: 07/01/2016 CLINICAL DATA:  Orogastric tube placement. EXAM: PORTABLE ABDOMEN - 1 VIEW COMPARISON:  Abdominal radiograph June 15, 2015 FINDINGS: Nasogastric tube tip projects in proximal stomach, side port at the GE  junction region. Inferior vena cava filter in place. Surgical clips RIGHT abdomen. Moderate amount of retained large bowel stool, bowel gas pattern is nondilated and nonobstructive. No intra-abdominal mass effect or pathologic calcifications. LEFT femoral neck pinning partially imaged. Included view of the chest demonstrates coarsened pulmonary interstitium. IMPRESSION: Nasogastric tube tip projects in proximal stomach. Electronically Signed   By: Thana Farr.D.  On: 07/02/2016 01:57     Patient Profile     83 year old female with a past medical history of PAF and DVT (on Coumadin) and recent hip fracture. Presented to [facility] ED with respiratory failure in the setting of STEMI.   She has no prior history of MI per family. She had a hip fracture in Sept. 2017, underwent surgery to repair. She resides at an assisted living facility and used the emergency life alert button on 06/15/15 around 1900. EMS arrived to find her in respiratory distress and with hemoptysis. INR was 2.73. Cath was deferred  Assessment & Plan    1. Anterolateral STEMI: Troponin is 1.19, continue to cycle. EKG with ST elevation. Now intubated for respiratory distress. Sedated, per nursing she is anxious when not sedated.   Continue heparin gtt. Echo pending.   Cath was deferred due to elevated INR, hemoptysis, advanced age. Plan is to manage medically. Patient is a DNR per her wishes.  BP too soft for beta blocker or ACE-I.   2. Acute respiratory failure: Per PCCM.   3. Leukocytosis: On antibiotics for possible HCAP. Low threshold to deescalate therapy per PCCM.   Signed, Arbutus Leas, NP  06/19/2016, 7:50 AM    Patient seen and examined. I had a long discussion with pt and family.  Pt is alert; Hemodynamics are stable Rhythm is sinus in the 60 - 70 range without ectopy.  BP 105/50. Troponin is only minimally elevated at 1.19. F/u ECG independently reviewed by me shows NSR at 68; LVH; Q wave in I and aVL with  preserved R waves and mild ST depression inferiorly. Plan for medical management.  Discussed that would extubate prematurely but allow time for patient to improve from pulmonary standpoint to allow greatest success and per DNR then not re-intubate. Plan for echo today to assess LV fxn.  Will check BNP. Warfarin on hold; continue heparin. No recurrent hemoptysis. Once pulmonary status is stable and extubated then consider low dose BB and ACE-I as BP allows.    Troy Sine, MD, [facility] 07/13/2016 10:25 AM

## 2016-06-15 NOTE — Progress Notes (Signed)
27ml of diprovan wasted in sharps with Jake Bathe RN

## 2016-06-16 LAB — URINE CULTURE: CULTURE: NO GROWTH

## 2016-06-17 LAB — CULTURE, RESPIRATORY

## 2016-06-17 LAB — CULTURE, RESPIRATORY W GRAM STAIN

## 2016-06-18 LAB — BLOOD GAS, ARTERIAL
Acid-base deficit: 0.3 mmol/L (ref 0.0–2.0)
Bicarbonate: 27.1 mmol/L (ref 20.0–28.0)
FIO2: 0.3
LHR: 16 {breaths}/min
MECHVT: 450 mL
O2 Saturation: 93.5 %
PATIENT TEMPERATURE: 37
PCO2 ART: 55 mmHg — AB (ref 32.0–48.0)
PEEP: 5 cmH2O
pH, Arterial: 7.3 — ABNORMAL LOW (ref 7.350–7.450)
pO2, Arterial: 76 mmHg — ABNORMAL LOW (ref 83.0–108.0)

## 2016-06-20 LAB — CULTURE, BLOOD (ROUTINE X 2)
CULTURE: NO GROWTH
CULTURE: NO GROWTH

## 2016-06-21 ENCOUNTER — Telehealth: Payer: Self-pay | Admitting: Cardiovascular Disease

## 2016-06-21 NOTE — Telephone Encounter (Signed)
D/C received from [place]- No physician here at [address] will sign. Sending to [facility] office for [facility] to sign.

## 2016-06-24 ENCOUNTER — Telehealth: Payer: Self-pay | Admitting: Cardiovascular Disease

## 2016-06-24 NOTE — Telephone Encounter (Signed)
Received Signed Death Certificate back from Dr Claiborne Billings.  [place] and advised Original Death Certificate ready for pick up. lp

## 2016-07-15 DEATH — deceased

## 2016-07-22 ENCOUNTER — Telehealth: Payer: Self-pay | Admitting: Cardiovascular Disease

## 2016-08-12 NOTE — Discharge Summary (Signed)
DISCHARGE SUMMARY/DEATH NOTE on [name]  Date of death Jul 05, 2016 [name] is an 83 year old female who has a history of DVT and PAF and had been on warfarin anticoagulation surgery.  She recently sustained a hip fracture and on the night of admission was admitted by Dr. Novella Olive on1/06/2016 shortly before midnight in transfer from [place] in a setting of respiratory distress and concern for possible hemoptysis and possible pneumonia/pulmonary edema.  She was transported to [facility] for further management.  Over the last several months the patient has had several surgeries including a hip and subsequent hematoma evacuation.  She has been felt to have mild cognitive and functional decline over the past 6 months.  He was concern for possible acute coronary syndrome.  The patient's daughter had brought with her paperwork documented the patient's wishes to be a DO NOT RESUSCITATE. She had been   initially intubated and when I had rounded on her in the morning she actually had fairly stable hemodynamics.  Her troponin was only minimally elevated at 1.19 and a follow-up ECG, independently reviewed by me showed normal sinus rhythm with a Q wave in lead 1 and aVL with preserved R waves and only mild ST segment depression. I had a lengthy discussion with the family members and recommended that they consider to allow some time to see if she continued to improve with good diuresis and if she would potentially stabilize prior to extubation.   Her warfarin had been on hold.  She did not have any further hemoptysis.  My plan was to get an echo Doppler study and if her heart function was adequate that she may be able to be treated medically and stabilize to do well post extubation.  I specifically discussed with them that if she were to be extubated prematurely that with her underlying cardiac condition this may create significant increased stress on her heart and lead to significant  further deterioration.  After much discussion, later in the day the family members made the decision that they wanted the patient extubated since it was the patient's wish for her not to be intubated.  Palliative care was involved.  The patient was extubated per family request and transitioned to comfort care.  She ultimately expired at 2209/10/01 and was pronounced dead by Eleonore Chiquito, RN, and Gladys Damme, RN with family in the room.  Shelva Majestic, MD  08/03/2016

## 2017-06-13 NOTE — Telephone Encounter (Signed)
error 

## 2017-10-05 IMAGING — CR DG CHEST 1V
1 series · 1 of 1 positions shown · non-contrast
Comparison: CT chest of 09/10/2014 and chest x-ray of 10/19/2011

CLINICAL DATA: Fell yesterday with shoulder and hip pain

EXAM:
CHEST 1 VIEW

[chest ap]
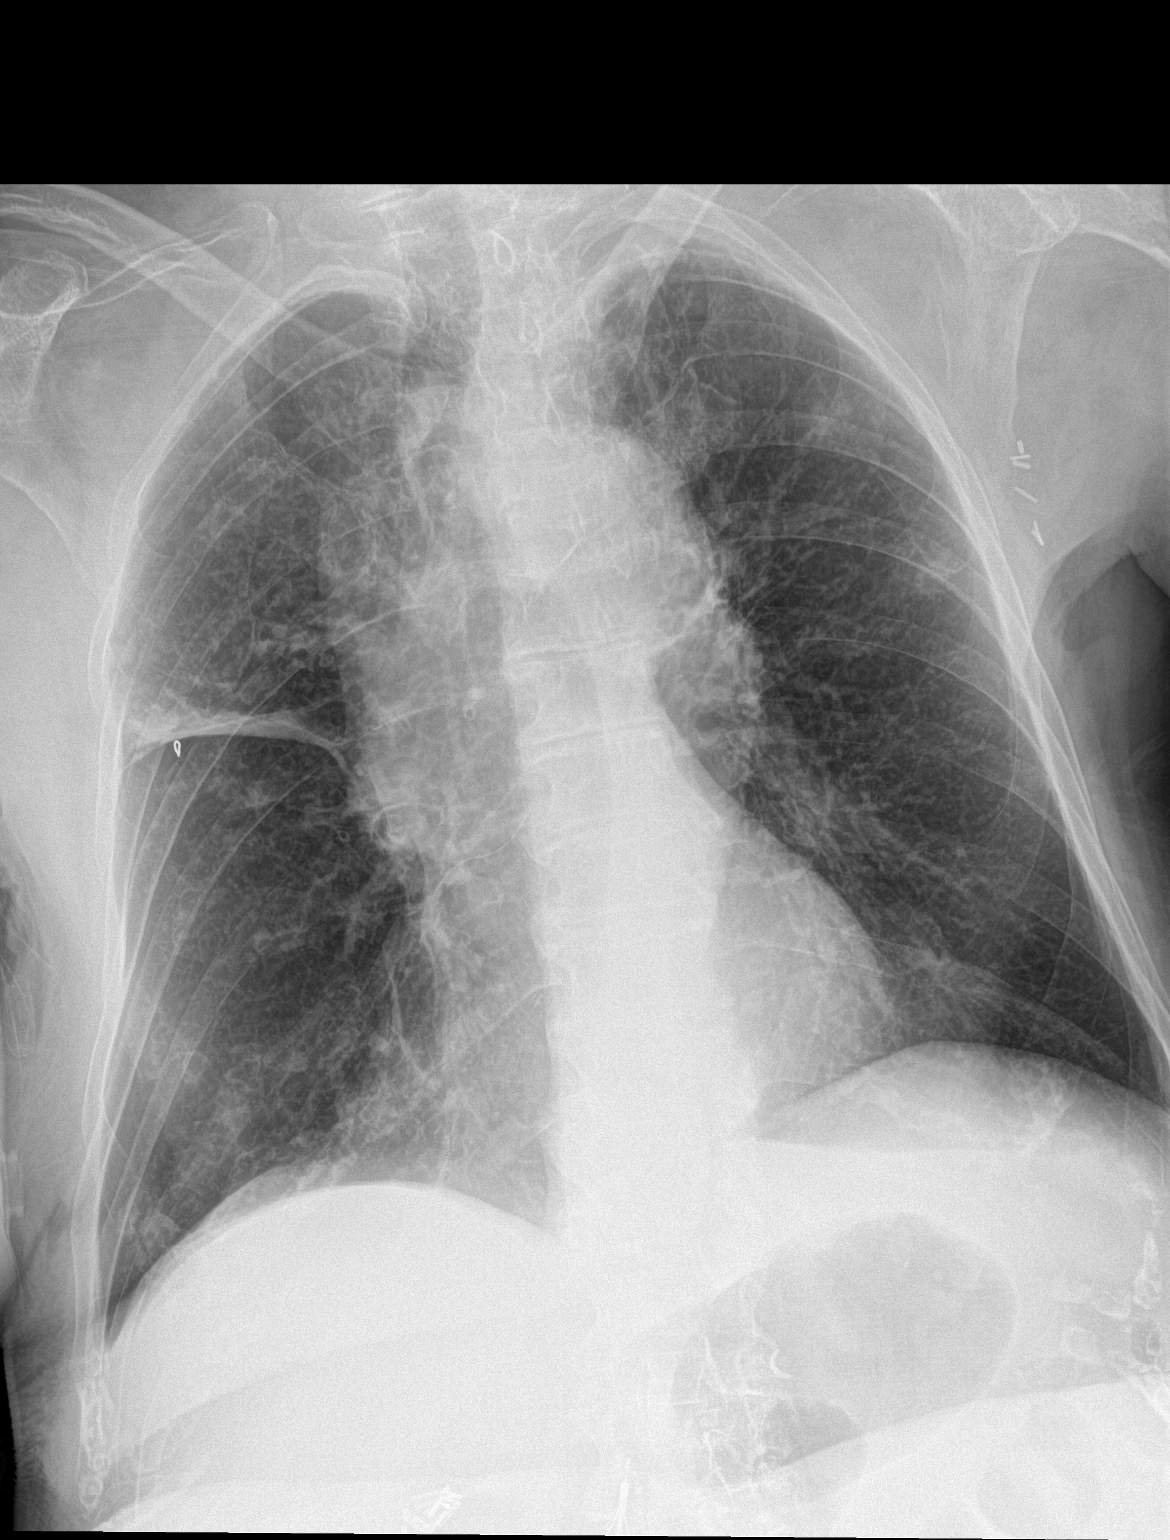

[1 of 1 positions shown; findings below may reference images not displayed]

FINDINGS: There is linear opacity in the right mid lung. This opacity likely
is chronic although atelectasis cannot be excluded. Vague nodular
opacities are present overlying the left anterior second and third
ribs possibly related to small lung nodules noted on prior CT of the
chest consistent with a chronic indolent infection. No pneumonia or
effusion is currently seen. The heart is within normal limits in
size. Surgical clips overlie the left axilla. The bones are
osteopenic.
IMPRESSION: 1. No definite active process.
2. Linear atelectasis or scarring the right mid lung.
3. Vague nodular opacities overlie the left anterior second and
third ribs possibly representing small nodules noted on prior CT of
the chest.

## 2017-10-05 IMAGING — CT CT HEAD W/O CM
3 series · 9 of 40 positions shown, 10 images · non-contrast
Comparison: MR brain 09/12/2015

CLINICAL DATA: Status post fall.  Left hip and left leg pain.

EXAM:
CT HEAD WITHOUT CONTRAST
TECHNIQUE: Contiguous axial images were obtained from the base of the skull
through the vertex without intravenous contrast.

[Series 2: cor head wo · oblique · 0.39mm/px · 3 of 28 slices shown, 4 images]
[im 7/28  brain]
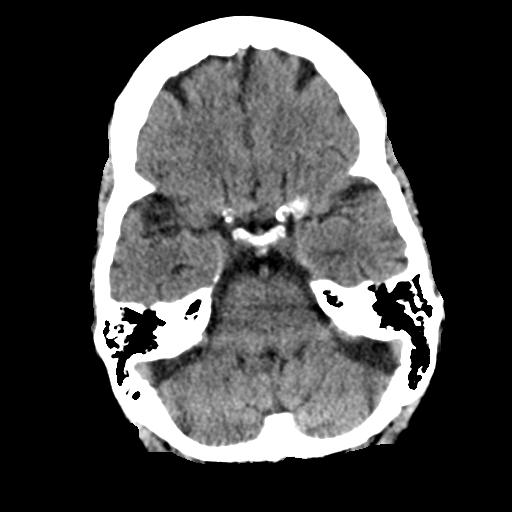
[im 7/28  bone]
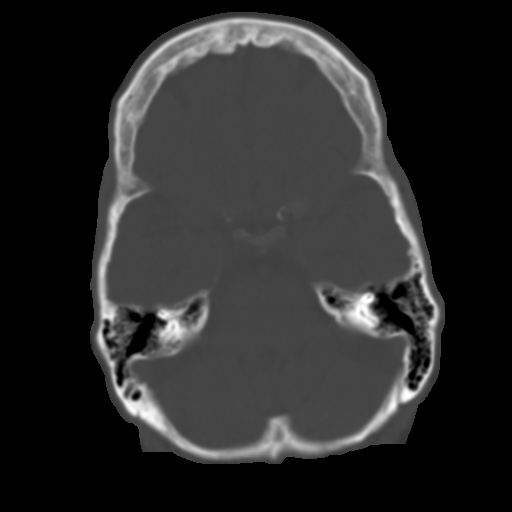
[im 14/28  brain]
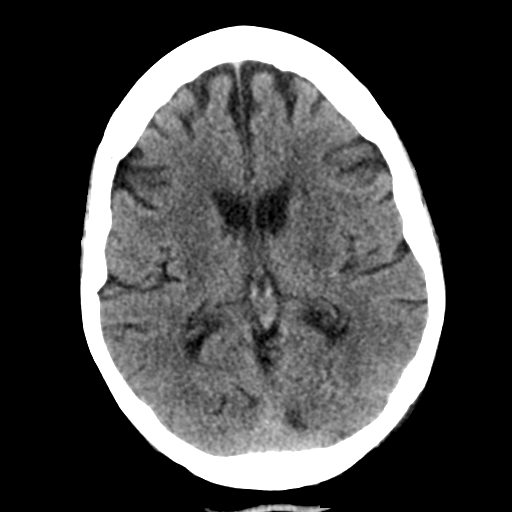
[im 21/28  brain]
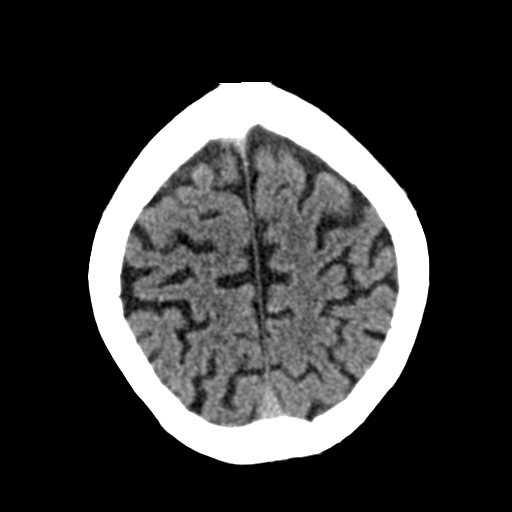

[Series 5: coronal soft tissue · coronal · 0.31mm/px · 3 of 65 slices shown]
[im 29/65  brain]
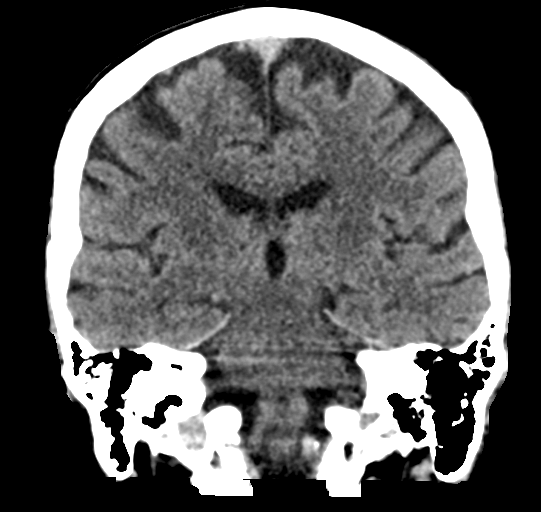
[im 30/65  brain]
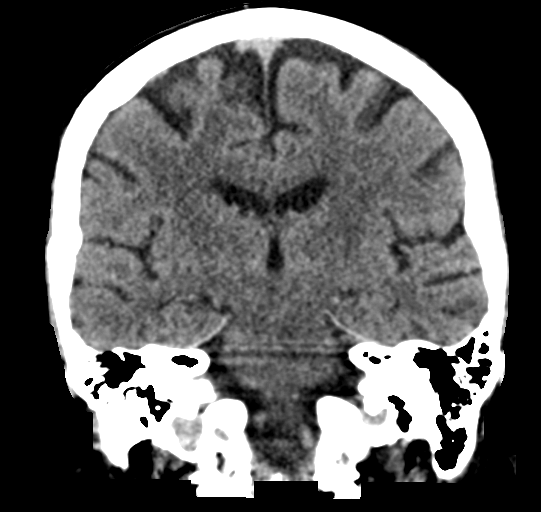
[im 35/65  brain]
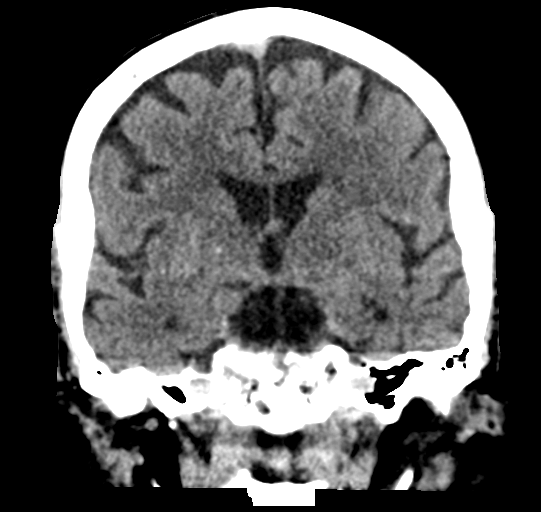

[Series 6: sagittal soft tissue · sagittal · 0.30mm/px · 3 of 51 slices shown]
[im 21/51  brain]
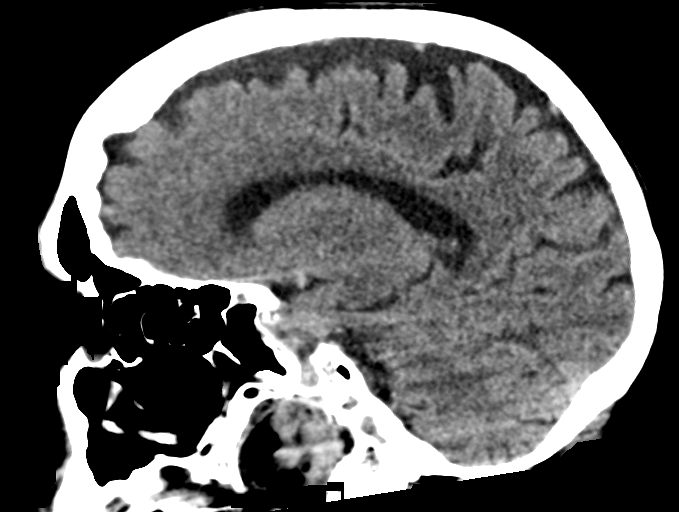
[im 26/51  brain]
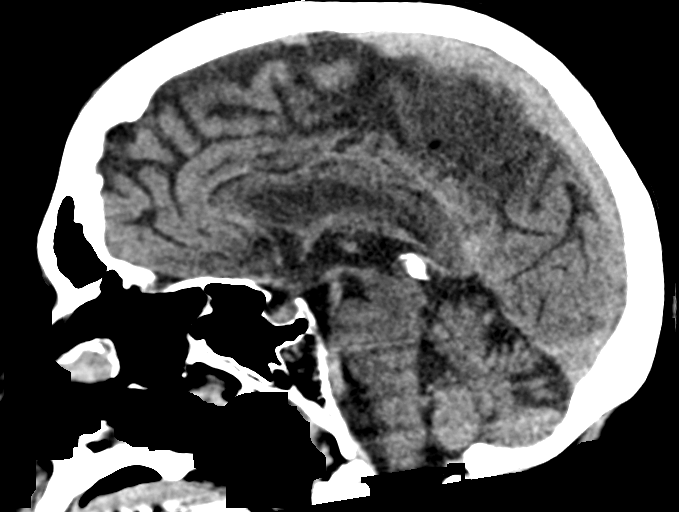
[im 31/51  brain]
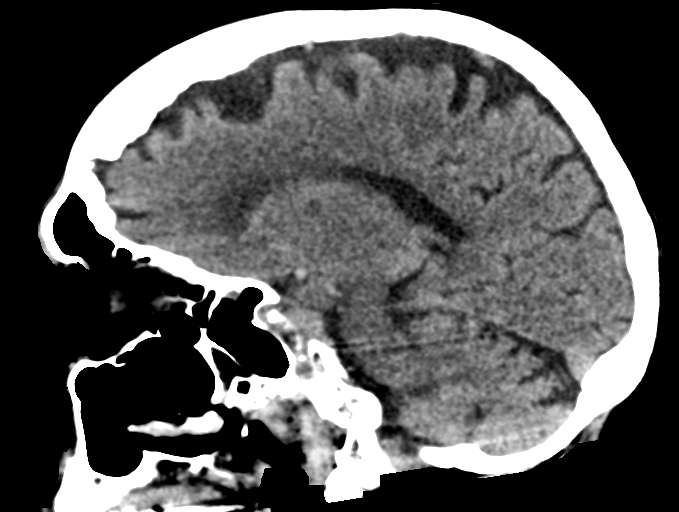

[9 of 40 positions shown; findings below may reference images not displayed]

FINDINGS: Brain: No evidence of acute infarction, hemorrhage, extra-axial
collection, ventriculomegaly, or mass effect. Generalized cerebral
atrophy. Periventricular white matter low attenuation likely
secondary to microangiopathy.

Vascular: Cerebrovascular atherosclerotic calcifications are noted.

Skull: Negative for fracture or focal lesion.

Sinuses/Orbits: Visualized portions of the orbits are unremarkable.
Visualized portions of the paranasal sinuses and mastoid air cells
are unremarkable.

Other: None.
IMPRESSION: 1. No acute intracranial pathology.
2. Chronic microvascular disease and cerebral atrophy.

## 2017-10-05 IMAGING — CR DG SHOULDER 2+V*L*
3 series · 3 of 3 positions shown · non-contrast
Comparison: CT chest of 09/10/2014

CLINICAL DATA: Recent fall with left shoulder pain

EXAM:
LEFT SHOULDER - 2+ VIEW

[shoulder grashey (1 of 2)]
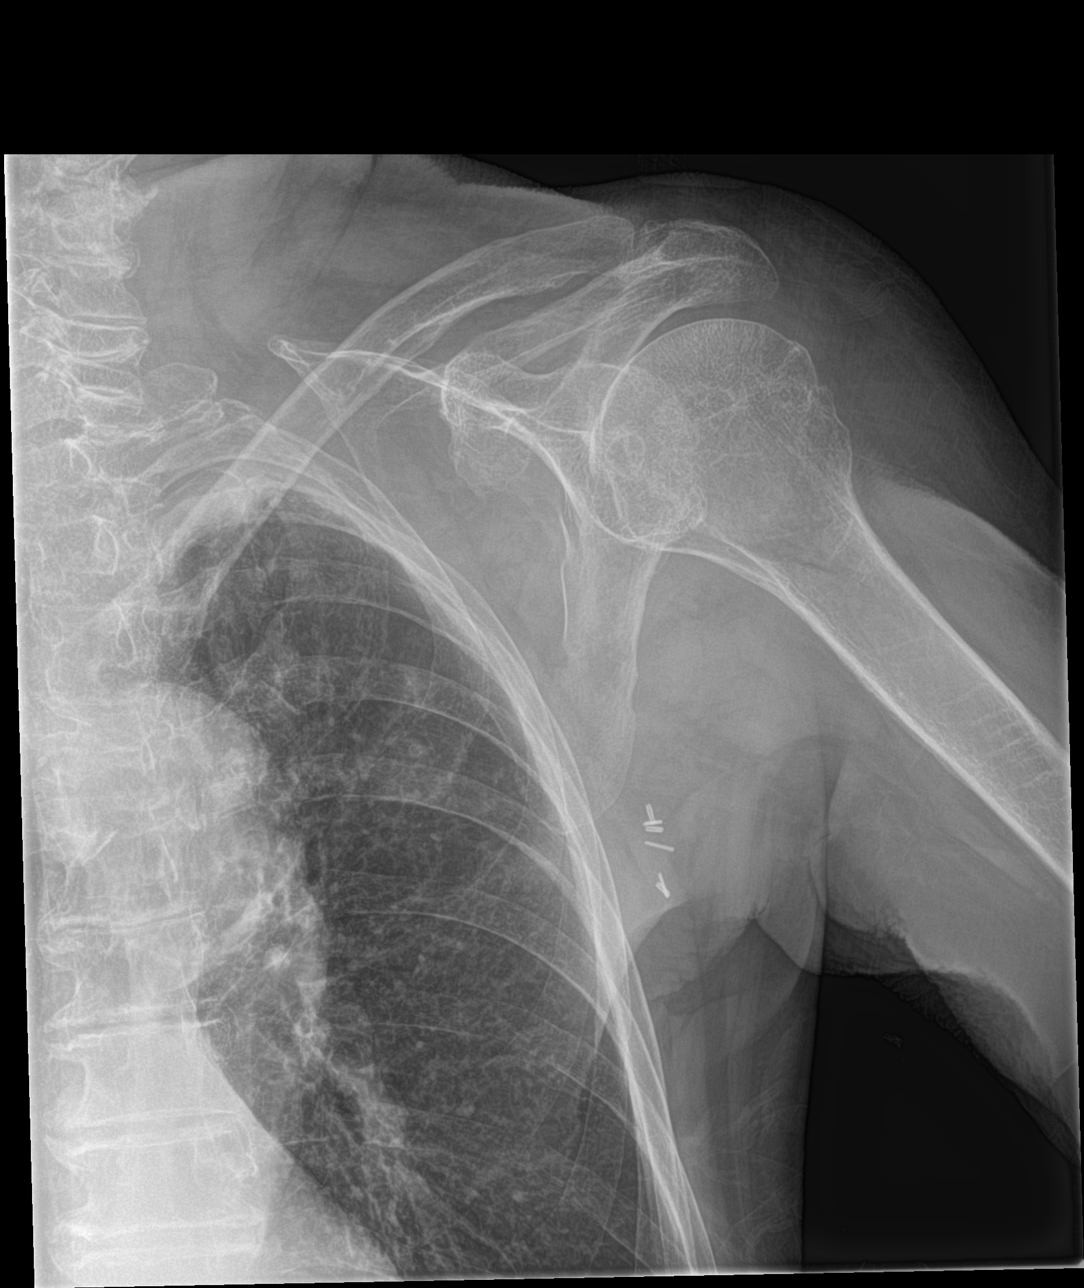

[shoulder y view]
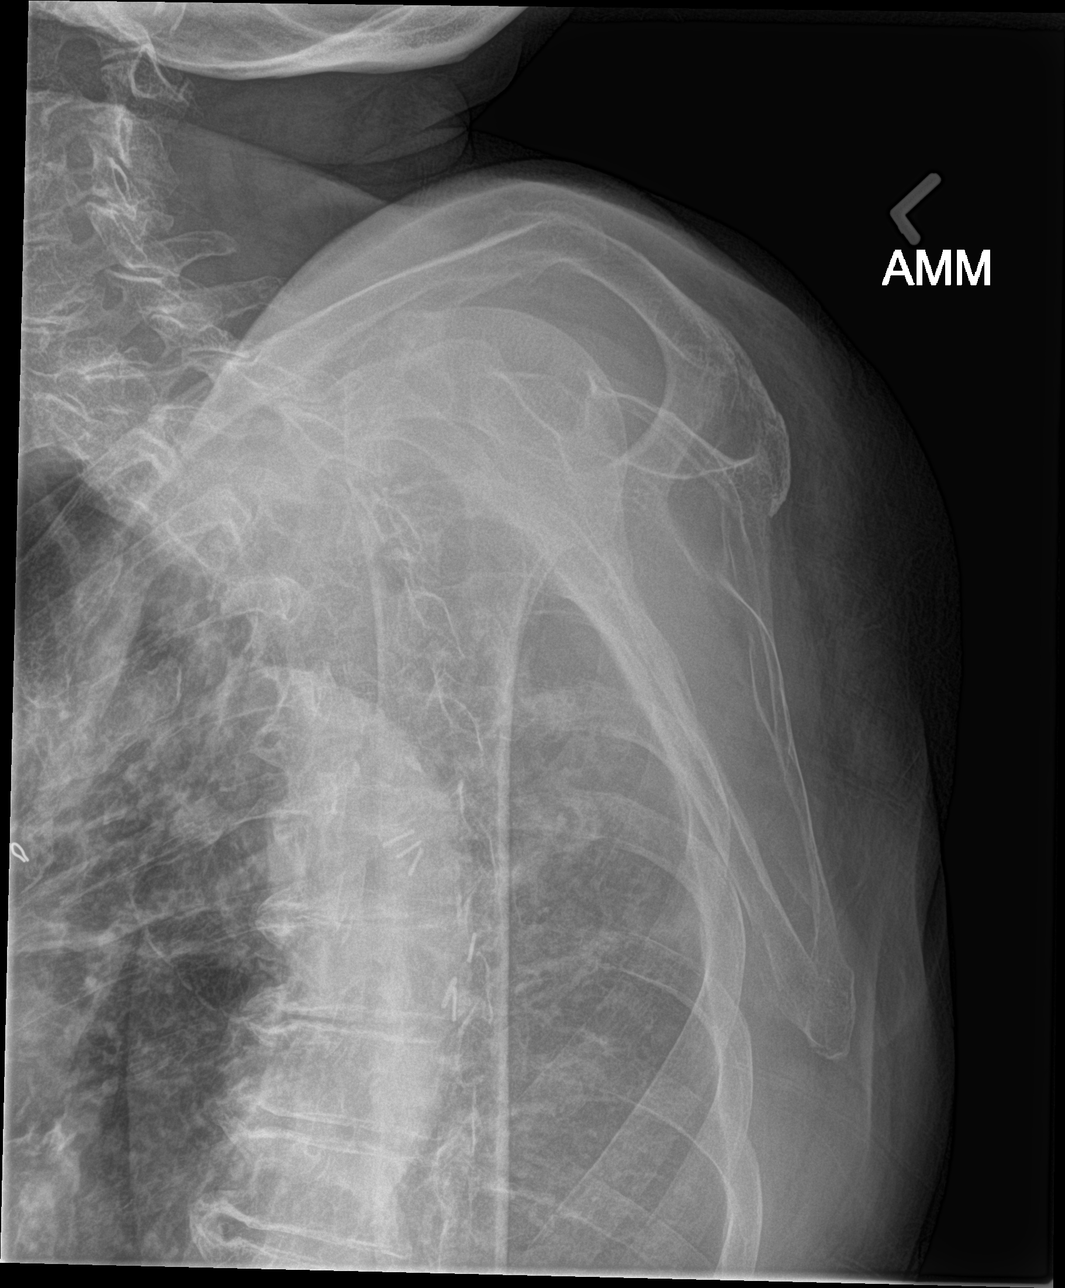

[shoulder grashey (2 of 2)]
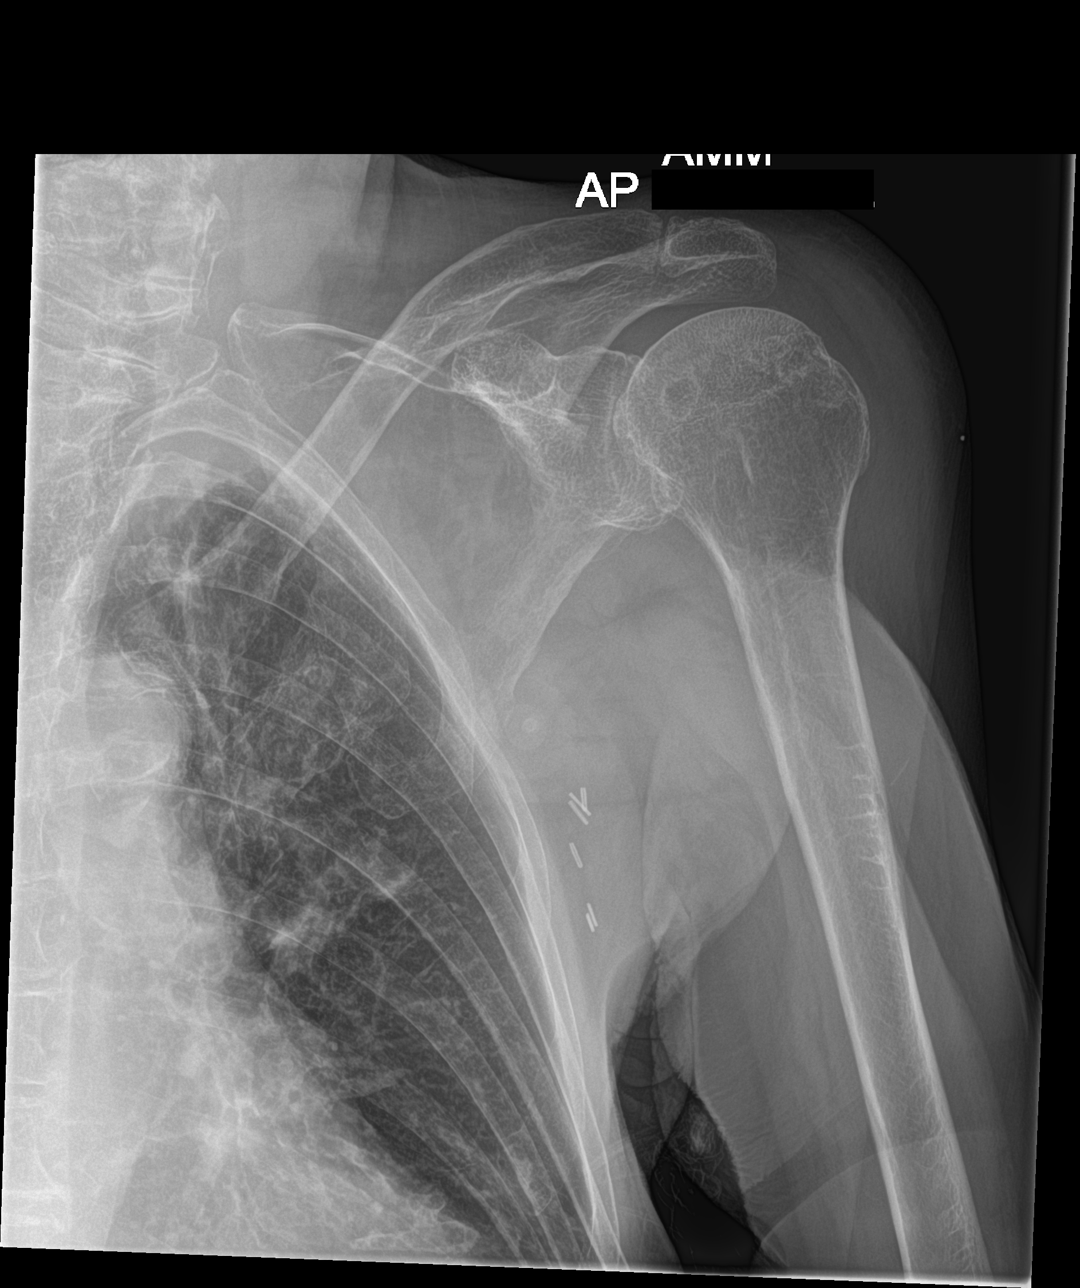

[3 of 3 positions shown; findings below may reference images not displayed]

FINDINGS: The bones are diffusely osteopenic. The left humeral head is in
normal position with only mild degenerative joint disease for age.
No fracture is seen. The left AC joint is normally aligned. The ribs
that are visualized appear intact. Subtle nodularity in the left mid
upper lung field may be due to chronic indolent infection as
described on prior CT of the chest.
IMPRESSION: Osteopenia.  No acute fracture.
